# Patient Record
Sex: Female | Born: 1955
Health system: Southern US, Community
[De-identification: ages and names within clinical notes are randomized; demographics above are authoritative.]

## PROBLEM LIST (undated history)

## (undated) DIAGNOSIS — IMO0002 Reserved for concepts with insufficient information to code with codable children: Secondary | ICD-10-CM

## (undated) DIAGNOSIS — N390 Urinary tract infection, site not specified: Secondary | ICD-10-CM

## (undated) DIAGNOSIS — I059 Rheumatic mitral valve disease, unspecified: Secondary | ICD-10-CM

## (undated) DIAGNOSIS — Z86010 Personal history of colonic polyps: Principal | ICD-10-CM

## (undated) DIAGNOSIS — H269 Unspecified cataract: Secondary | ICD-10-CM

## (undated) DIAGNOSIS — M1990 Unspecified osteoarthritis, unspecified site: Secondary | ICD-10-CM

## (undated) DIAGNOSIS — E785 Hyperlipidemia, unspecified: Secondary | ICD-10-CM

## (undated) DIAGNOSIS — B2790 Infectious mononucleosis, unspecified without complication: Secondary | ICD-10-CM

## (undated) DIAGNOSIS — H409 Unspecified glaucoma: Secondary | ICD-10-CM

## (undated) DIAGNOSIS — R011 Cardiac murmur, unspecified: Secondary | ICD-10-CM

## (undated) DIAGNOSIS — M5412 Radiculopathy, cervical region: Secondary | ICD-10-CM

## (undated) HISTORY — DX: Cardiac murmur, unspecified: R01.1

## (undated) HISTORY — PX: CATARACT EXTRACTION: SUR2

## (undated) HISTORY — DX: Infectious mononucleosis, unspecified without complication: B27.90

## (undated) HISTORY — PX: COLONOSCOPY: SHX174

## (undated) HISTORY — DX: Radiculopathy, cervical region: M54.12

## (undated) HISTORY — DX: Urinary tract infection, site not specified: N39.0

## (undated) HISTORY — DX: Personal history of colonic polyps: Z86.010

## (undated) HISTORY — DX: Unspecified osteoarthritis, unspecified site: M19.90

## (undated) HISTORY — DX: Unspecified glaucoma: H40.9

## (undated) HISTORY — PX: COLPOSCOPY: SHX161

## (undated) HISTORY — DX: Reserved for concepts with insufficient information to code with codable children: IMO0002

## (undated) HISTORY — DX: Rheumatic mitral valve disease, unspecified: I05.9

## (undated) HISTORY — PX: WISDOM TOOTH EXTRACTION: SHX21

## (undated) HISTORY — DX: Hyperlipidemia, unspecified: E78.5

## (undated) HISTORY — DX: Unspecified cataract: H26.9

---

## 1998-07-02 ENCOUNTER — Encounter: Admission: RE | Admit: 1998-07-02 | Discharge: 1998-09-30 | Payer: Self-pay | Admitting: Internal Medicine

## 1998-10-10 ENCOUNTER — Other Ambulatory Visit: Admission: RE | Admit: 1998-10-10 | Discharge: 1998-10-10 | Payer: Self-pay | Admitting: Obstetrics and Gynecology

## 1999-01-22 ENCOUNTER — Encounter: Payer: Self-pay | Admitting: Obstetrics and Gynecology

## 1999-01-22 ENCOUNTER — Ambulatory Visit (HOSPITAL_COMMUNITY): Admission: RE | Admit: 1999-01-22 | Discharge: 1999-01-22 | Payer: Self-pay | Admitting: Obstetrics and Gynecology

## 1999-01-29 ENCOUNTER — Ambulatory Visit (HOSPITAL_COMMUNITY): Admission: RE | Admit: 1999-01-29 | Discharge: 1999-01-29 | Payer: Self-pay | Admitting: Obstetrics and Gynecology

## 1999-01-29 ENCOUNTER — Encounter: Payer: Self-pay | Admitting: Obstetrics and Gynecology

## 1999-02-13 ENCOUNTER — Encounter: Payer: Self-pay | Admitting: Obstetrics and Gynecology

## 1999-02-13 ENCOUNTER — Ambulatory Visit (HOSPITAL_COMMUNITY): Admission: RE | Admit: 1999-02-13 | Discharge: 1999-02-13 | Payer: Self-pay | Admitting: Obstetrics and Gynecology

## 1999-05-03 ENCOUNTER — Other Ambulatory Visit: Admission: RE | Admit: 1999-05-03 | Discharge: 1999-05-03 | Payer: Self-pay | Admitting: Obstetrics and Gynecology

## 1999-12-13 ENCOUNTER — Other Ambulatory Visit: Admission: RE | Admit: 1999-12-13 | Discharge: 1999-12-13 | Payer: Self-pay | Admitting: Obstetrics and Gynecology

## 2000-01-29 ENCOUNTER — Ambulatory Visit (HOSPITAL_COMMUNITY): Admission: RE | Admit: 2000-01-29 | Discharge: 2000-01-29 | Payer: Self-pay | Admitting: Obstetrics and Gynecology

## 2000-01-29 ENCOUNTER — Encounter: Payer: Self-pay | Admitting: Obstetrics and Gynecology

## 2000-12-18 ENCOUNTER — Other Ambulatory Visit: Admission: RE | Admit: 2000-12-18 | Discharge: 2000-12-18 | Payer: Self-pay | Admitting: Obstetrics and Gynecology

## 2000-12-21 ENCOUNTER — Encounter: Payer: Self-pay | Admitting: Obstetrics and Gynecology

## 2000-12-21 ENCOUNTER — Encounter: Admission: RE | Admit: 2000-12-21 | Discharge: 2000-12-21 | Payer: Self-pay | Admitting: Obstetrics and Gynecology

## 2001-10-06 ENCOUNTER — Other Ambulatory Visit: Admission: RE | Admit: 2001-10-06 | Discharge: 2001-10-06 | Payer: Self-pay | Admitting: Obstetrics and Gynecology

## 2001-10-12 ENCOUNTER — Encounter: Admission: RE | Admit: 2001-10-12 | Discharge: 2001-10-12 | Payer: Self-pay | Admitting: Obstetrics and Gynecology

## 2001-10-12 ENCOUNTER — Encounter: Payer: Self-pay | Admitting: Obstetrics and Gynecology

## 2002-07-14 ENCOUNTER — Other Ambulatory Visit: Admission: RE | Admit: 2002-07-14 | Discharge: 2002-07-14 | Payer: Self-pay | Admitting: Obstetrics and Gynecology

## 2002-10-25 ENCOUNTER — Encounter: Payer: Self-pay | Admitting: Obstetrics and Gynecology

## 2002-10-25 ENCOUNTER — Encounter: Admission: RE | Admit: 2002-10-25 | Discharge: 2002-10-25 | Payer: Self-pay | Admitting: Obstetrics and Gynecology

## 2003-07-17 ENCOUNTER — Other Ambulatory Visit: Admission: RE | Admit: 2003-07-17 | Discharge: 2003-07-17 | Payer: Self-pay | Admitting: Obstetrics and Gynecology

## 2003-12-11 ENCOUNTER — Encounter: Admission: RE | Admit: 2003-12-11 | Discharge: 2003-12-11 | Payer: Self-pay | Admitting: Obstetrics and Gynecology

## 2004-06-21 DIAGNOSIS — IMO0002 Reserved for concepts with insufficient information to code with codable children: Secondary | ICD-10-CM

## 2004-06-21 DIAGNOSIS — R87619 Unspecified abnormal cytological findings in specimens from cervix uteri: Secondary | ICD-10-CM

## 2004-06-21 HISTORY — DX: Reserved for concepts with insufficient information to code with codable children: IMO0002

## 2004-06-21 HISTORY — DX: Unspecified abnormal cytological findings in specimens from cervix uteri: R87.619

## 2004-06-21 HISTORY — PX: CERVICAL BIOPSY  W/ LOOP ELECTRODE EXCISION: SUR135

## 2004-07-18 ENCOUNTER — Other Ambulatory Visit: Admission: RE | Admit: 2004-07-18 | Discharge: 2004-07-18 | Payer: Self-pay | Admitting: Obstetrics and Gynecology

## 2004-07-18 ENCOUNTER — Encounter: Admission: RE | Admit: 2004-07-18 | Discharge: 2004-07-18 | Payer: Self-pay | Admitting: Obstetrics and Gynecology

## 2005-01-16 ENCOUNTER — Other Ambulatory Visit: Admission: RE | Admit: 2005-01-16 | Discharge: 2005-01-16 | Payer: Self-pay | Admitting: *Deleted

## 2005-01-21 ENCOUNTER — Encounter: Admission: RE | Admit: 2005-01-21 | Discharge: 2005-01-21 | Payer: Self-pay | Admitting: *Deleted

## 2005-02-11 ENCOUNTER — Encounter: Admission: RE | Admit: 2005-02-11 | Discharge: 2005-02-11 | Payer: Self-pay | Admitting: *Deleted

## 2005-03-13 ENCOUNTER — Ambulatory Visit: Payer: Self-pay | Admitting: Internal Medicine

## 2005-07-21 ENCOUNTER — Other Ambulatory Visit: Admission: RE | Admit: 2005-07-21 | Discharge: 2005-07-21 | Payer: Self-pay | Admitting: *Deleted

## 2005-09-29 ENCOUNTER — Ambulatory Visit: Payer: Self-pay | Admitting: Internal Medicine

## 2005-10-06 ENCOUNTER — Ambulatory Visit: Payer: Self-pay | Admitting: Internal Medicine

## 2005-10-07 ENCOUNTER — Ambulatory Visit: Payer: Self-pay | Admitting: Internal Medicine

## 2005-11-12 ENCOUNTER — Ambulatory Visit (HOSPITAL_COMMUNITY): Admission: RE | Admit: 2005-11-12 | Discharge: 2005-11-12 | Payer: Self-pay | Admitting: Gastroenterology

## 2005-11-12 ENCOUNTER — Encounter (INDEPENDENT_AMBULATORY_CARE_PROVIDER_SITE_OTHER): Payer: Self-pay | Admitting: Specialist

## 2006-01-16 ENCOUNTER — Other Ambulatory Visit: Admission: RE | Admit: 2006-01-16 | Discharge: 2006-01-16 | Payer: Self-pay | Admitting: Obstetrics and Gynecology

## 2006-02-04 ENCOUNTER — Encounter: Admission: RE | Admit: 2006-02-04 | Discharge: 2006-02-04 | Payer: Self-pay | Admitting: Obstetrics and Gynecology

## 2006-08-13 ENCOUNTER — Other Ambulatory Visit: Admission: RE | Admit: 2006-08-13 | Discharge: 2006-08-13 | Payer: Self-pay | Admitting: Obstetrics & Gynecology

## 2006-10-26 ENCOUNTER — Ambulatory Visit: Payer: Self-pay | Admitting: Internal Medicine

## 2006-10-26 LAB — CONVERTED CEMR LAB
Albumin: 4 g/dL (ref 3.5–5.2)
BUN: 12 mg/dL (ref 6–23)
Basophils Absolute: 0.1 10*3/uL (ref 0.0–0.1)
CO2: 29 meq/L (ref 19–32)
Calcium: 9.2 mg/dL (ref 8.4–10.5)
Chol/HDL Ratio, serum: 4.3
Creatinine, Ser: 0.9 mg/dL (ref 0.4–1.2)
Glucose, Bld: 95 mg/dL (ref 70–99)
Hemoglobin: 13.5 g/dL (ref 12.0–15.0)
Ketones, ur: NEGATIVE mg/dL
Lymphocytes Relative: 28.6 % (ref 12.0–46.0)
MCHC: 33.5 g/dL (ref 30.0–36.0)
Monocytes Absolute: 0.4 10*3/uL (ref 0.2–0.7)
Monocytes Relative: 7.3 % (ref 3.0–11.0)
Mucus, UA: NEGATIVE
Neutro Abs: 3 10*3/uL (ref 1.4–7.7)
Neutrophils Relative %: 60.5 % (ref 43.0–77.0)
Platelets: 292 10*3/uL (ref 150–400)
TSH: 0.78 microintl units/mL (ref 0.35–5.50)
Triglyceride fasting, serum: 70 mg/dL (ref 0–149)
Urine Glucose: NEGATIVE mg/dL
Urobilinogen, UA: 0.2 (ref 0.0–1.0)
VLDL: 14 mg/dL (ref 0–40)
pH: 5.5 (ref 5.0–8.0)

## 2006-11-02 ENCOUNTER — Ambulatory Visit: Payer: Self-pay | Admitting: Internal Medicine

## 2007-02-09 ENCOUNTER — Encounter: Admission: RE | Admit: 2007-02-09 | Discharge: 2007-02-09 | Payer: Self-pay | Admitting: Internal Medicine

## 2007-03-15 ENCOUNTER — Ambulatory Visit: Payer: Self-pay | Admitting: Internal Medicine

## 2007-03-15 LAB — CONVERTED CEMR LAB
Albumin: 3.9 g/dL (ref 3.5–5.2)
Alkaline Phosphatase: 17 units/L — ABNORMAL LOW (ref 39–117)
Cholesterol: 186 mg/dL (ref 0–200)
LDL Cholesterol: 117 mg/dL — ABNORMAL HIGH (ref 0–99)
Total CHOL/HDL Ratio: 3.3
Total Protein: 7.1 g/dL (ref 6.0–8.3)
VLDL: 13 mg/dL (ref 0–40)

## 2007-08-05 DIAGNOSIS — E785 Hyperlipidemia, unspecified: Secondary | ICD-10-CM | POA: Insufficient documentation

## 2007-08-05 DIAGNOSIS — I059 Rheumatic mitral valve disease, unspecified: Secondary | ICD-10-CM

## 2007-08-05 HISTORY — DX: Rheumatic mitral valve disease, unspecified: I05.9

## 2007-08-16 ENCOUNTER — Other Ambulatory Visit: Admission: RE | Admit: 2007-08-16 | Discharge: 2007-08-16 | Payer: Self-pay | Admitting: Obstetrics and Gynecology

## 2007-09-28 ENCOUNTER — Telehealth (INDEPENDENT_AMBULATORY_CARE_PROVIDER_SITE_OTHER): Payer: Self-pay | Admitting: *Deleted

## 2007-11-29 ENCOUNTER — Encounter: Payer: Self-pay | Admitting: Internal Medicine

## 2007-11-29 ENCOUNTER — Ambulatory Visit: Payer: Self-pay | Admitting: Neurosurgery

## 2007-11-29 LAB — CONVERTED CEMR LAB
Albumin: 3.5 g/dL (ref 3.5–5.2)
Alkaline Phosphatase: 24 units/L — ABNORMAL LOW (ref 39–117)
BUN: 8 mg/dL (ref 6–23)
Basophils Relative: 1.9 % — ABNORMAL HIGH (ref 0.0–1.0)
Crystals: NEGATIVE
Eosinophils Absolute: 0.2 10*3/uL (ref 0.0–0.6)
GFR calc Af Amer: 97 mL/min
HDL: 51.7 mg/dL (ref >39.0)
Ketones, ur: NEGATIVE mg/dL
Leukocytes, UA: NEGATIVE
Lymphocytes Relative: 22.5 % (ref 12.0–46.0)
Monocytes Relative: 6.2 % (ref 3.0–11.0)
Neutro Abs: 5.4 10*3/uL (ref 1.4–7.7)
Platelets: 256 10*3/uL (ref 150–400)
Potassium: 4.1 meq/L (ref 3.5–5.1)
Specific Gravity, Urine: 1.025 (ref 1.000–1.03)
Total CHOL/HDL Ratio: 3.6
Total Protein: 6.5 g/dL (ref 6.0–8.3)
Triglycerides: 79 mg/dL (ref 0–149)
Urine Glucose: NEGATIVE mg/dL
Urobilinogen, UA: 0.2 (ref 0.0–1.0)
VLDL: 16 mg/dL (ref 0–40)
Vit D, 1,25-Dihydroxy: 38 (ref 30–89)
pH: 5.5 (ref 5.0–8.0)

## 2007-11-30 ENCOUNTER — Ambulatory Visit: Payer: Self-pay | Admitting: Internal Medicine

## 2007-11-30 ENCOUNTER — Encounter: Payer: Self-pay | Admitting: Internal Medicine

## 2007-12-01 ENCOUNTER — Telehealth: Payer: Self-pay | Admitting: Internal Medicine

## 2007-12-06 ENCOUNTER — Ambulatory Visit: Payer: Self-pay | Admitting: Internal Medicine

## 2007-12-06 DIAGNOSIS — N63 Unspecified lump in unspecified breast: Secondary | ICD-10-CM | POA: Insufficient documentation

## 2007-12-06 DIAGNOSIS — N390 Urinary tract infection, site not specified: Secondary | ICD-10-CM

## 2007-12-06 HISTORY — DX: Urinary tract infection, site not specified: N39.0

## 2007-12-07 ENCOUNTER — Telehealth: Payer: Self-pay | Admitting: Internal Medicine

## 2007-12-07 ENCOUNTER — Encounter: Payer: Self-pay | Admitting: Internal Medicine

## 2007-12-08 ENCOUNTER — Encounter: Admission: RE | Admit: 2007-12-08 | Discharge: 2007-12-08 | Payer: Self-pay | Admitting: Internal Medicine

## 2008-01-03 ENCOUNTER — Telehealth: Payer: Self-pay | Admitting: *Deleted

## 2008-02-22 ENCOUNTER — Telehealth (INDEPENDENT_AMBULATORY_CARE_PROVIDER_SITE_OTHER): Payer: Self-pay | Admitting: *Deleted

## 2008-03-09 ENCOUNTER — Encounter: Admission: RE | Admit: 2008-03-09 | Discharge: 2008-03-09 | Payer: Self-pay | Admitting: Internal Medicine

## 2008-08-27 ENCOUNTER — Inpatient Hospital Stay (HOSPITAL_COMMUNITY): Admission: AD | Admit: 2008-08-27 | Discharge: 2008-08-27 | Payer: Self-pay | Admitting: Gynecology

## 2008-10-09 ENCOUNTER — Other Ambulatory Visit: Admission: RE | Admit: 2008-10-09 | Discharge: 2008-10-09 | Payer: Self-pay | Admitting: Obstetrics and Gynecology

## 2008-11-29 ENCOUNTER — Ambulatory Visit: Payer: Self-pay | Admitting: Internal Medicine

## 2008-11-29 LAB — CONVERTED CEMR LAB
Albumin: 3.8 g/dL (ref 3.5–5.2)
BUN: 12 mg/dL (ref 6–23)
Basophils Relative: 0.7 % (ref 0.0–3.0)
Calcium: 8.8 mg/dL (ref 8.4–10.5)
Cholesterol: 181 mg/dL (ref 0–200)
Creatinine, Ser: 0.7 mg/dL (ref 0.4–1.2)
Eosinophils Absolute: 0.2 10*3/uL (ref 0.0–0.7)
Eosinophils Relative: 2.2 % (ref 0.0–5.0)
GFR calc Af Amer: 113 mL/min
GFR calc non Af Amer: 93 mL/min
HCT: 38.7 % (ref 36.0–46.0)
HDL: 45.2 mg/dL (ref >39.0)
Hemoglobin, Urine: NEGATIVE
Hemoglobin: 13.1 g/dL (ref 12.0–15.0)
MCV: 93.4 fL (ref 78.0–100.0)
Monocytes Absolute: 0.6 10*3/uL (ref 0.1–1.0)
Neutro Abs: 4.6 10*3/uL (ref 1.4–7.7)
Platelets: 240 10*3/uL (ref 150–400)
Potassium: 4.1 meq/L (ref 3.5–5.1)
TSH: 1.24 microintl units/mL (ref 0.35–5.50)
Total Protein, Urine: NEGATIVE mg/dL
Total Protein: 6.6 g/dL (ref 6.0–8.3)
Urine Glucose: NEGATIVE mg/dL
VLDL: 18 mg/dL (ref 0–40)
WBC: 7.3 10*3/uL (ref 4.5–10.5)

## 2008-12-06 ENCOUNTER — Ambulatory Visit: Payer: Self-pay | Admitting: Internal Medicine

## 2008-12-07 ENCOUNTER — Telehealth: Payer: Self-pay | Admitting: Internal Medicine

## 2008-12-07 ENCOUNTER — Encounter: Payer: Self-pay | Admitting: Internal Medicine

## 2009-03-13 ENCOUNTER — Encounter: Admission: RE | Admit: 2009-03-13 | Discharge: 2009-03-13 | Payer: Self-pay | Admitting: Internal Medicine

## 2009-10-22 ENCOUNTER — Telehealth: Payer: Self-pay | Admitting: Internal Medicine

## 2010-01-17 ENCOUNTER — Ambulatory Visit: Payer: Self-pay | Admitting: Internal Medicine

## 2010-01-17 ENCOUNTER — Encounter: Payer: Self-pay | Admitting: Internal Medicine

## 2010-01-29 ENCOUNTER — Ambulatory Visit: Payer: Self-pay | Admitting: Internal Medicine

## 2010-01-29 LAB — CONVERTED CEMR LAB
Albumin: 4.2 g/dL (ref 3.5–5.2)
Alkaline Phosphatase: 22 units/L — ABNORMAL LOW (ref 39–117)
Basophils Absolute: 0 10*3/uL (ref 0.0–0.1)
Basophils Relative: 0 % (ref 0.0–3.0)
Calcium: 9.3 mg/dL (ref 8.4–10.5)
Cholesterol: 204 mg/dL — ABNORMAL HIGH (ref 0–200)
Direct LDL: 134.3 mg/dL
Eosinophils Absolute: 0.1 10*3/uL (ref 0.0–0.7)
GFR calc non Af Amer: 79.63 mL/min (ref >60)
HDL: 55.3 mg/dL (ref >39.00)
Hemoglobin, Urine: NEGATIVE
Leukocytes, UA: NEGATIVE
Lymphocytes Relative: 28.7 % (ref 12.0–46.0)
MCHC: 34 g/dL (ref 30.0–36.0)
MCV: 93.1 fL (ref 78.0–100.0)
Monocytes Absolute: 0.4 10*3/uL (ref 0.1–1.0)
Neutro Abs: 3.7 10*3/uL (ref 1.4–7.7)
Neutrophils Relative %: 61.9 % (ref 43.0–77.0)
Nitrite: NEGATIVE
Potassium: 4.5 meq/L (ref 3.5–5.1)
RDW: 12.5 % (ref 11.5–14.6)
Sodium: 144 meq/L (ref 135–145)
Specific Gravity, Urine: 1.025 (ref 1.000–1.030)
Total Protein: 7.1 g/dL (ref 6.0–8.3)
Urine Glucose: NEGATIVE mg/dL
Urobilinogen, UA: 0.2 (ref 0.0–1.0)
VLDL: 18.4 mg/dL (ref 0.0–40.0)

## 2010-02-01 LAB — CONVERTED CEMR LAB: Vit D, 25-Hydroxy: 52 ng/mL (ref 30–89)

## 2010-02-05 ENCOUNTER — Ambulatory Visit: Payer: Self-pay | Admitting: Internal Medicine

## 2010-04-30 ENCOUNTER — Encounter: Admission: RE | Admit: 2010-04-30 | Discharge: 2010-04-30 | Payer: Self-pay | Admitting: Internal Medicine

## 2010-08-06 ENCOUNTER — Telehealth: Payer: Self-pay | Admitting: Internal Medicine

## 2010-08-08 ENCOUNTER — Ambulatory Visit: Payer: Self-pay | Admitting: Internal Medicine

## 2010-08-08 LAB — CONVERTED CEMR LAB
Direct LDL: 133.8 mg/dL
Total CHOL/HDL Ratio: 4

## 2010-09-12 ENCOUNTER — Encounter
Admission: RE | Admit: 2010-09-12 | Discharge: 2010-12-10 | Payer: Self-pay | Source: Home / Self Care | Attending: Internal Medicine | Admitting: Internal Medicine

## 2010-09-24 ENCOUNTER — Ambulatory Visit: Payer: Self-pay | Admitting: Family Medicine

## 2010-09-24 DIAGNOSIS — R109 Unspecified abdominal pain: Secondary | ICD-10-CM | POA: Insufficient documentation

## 2010-09-24 DIAGNOSIS — R350 Frequency of micturition: Secondary | ICD-10-CM | POA: Insufficient documentation

## 2010-09-25 ENCOUNTER — Encounter: Payer: Self-pay | Admitting: Internal Medicine

## 2010-09-25 LAB — CONVERTED CEMR LAB
ALT: 13 units/L (ref 0–35)
Albumin: 4.3 g/dL (ref 3.5–5.2)
Basophils Relative: 1 % (ref 0.0–3.0)
Bilirubin, Direct: 0.1 mg/dL (ref 0.0–0.3)
CO2: 30 meq/L (ref 19–32)
Chloride: 103 meq/L (ref 96–112)
Creatinine, Ser: 0.8 mg/dL (ref 0.4–1.2)
Eosinophils Relative: 1.1 % (ref 0.0–5.0)
HCT: 38.6 % (ref 36.0–46.0)
Hemoglobin: 13.3 g/dL (ref 12.0–15.0)
MCHC: 34.5 g/dL (ref 30.0–36.0)
MCV: 92.3 fL (ref 78.0–100.0)
Monocytes Absolute: 0.4 10*3/uL (ref 0.1–1.0)
Neutro Abs: 4.6 10*3/uL (ref 1.4–7.7)
Neutrophils Relative %: 62.5 % (ref 43.0–77.0)
Potassium: 3.8 meq/L (ref 3.5–5.1)
RBC: 4.18 M/uL (ref 3.87–5.11)
Sed Rate: 10 mm/hr (ref 0–22)
Sodium: 141 meq/L (ref 135–145)
Total Protein: 7.3 g/dL (ref 6.0–8.3)
WBC: 7.4 10*3/uL (ref 4.5–10.5)

## 2010-10-22 LAB — HM PAP SMEAR

## 2010-10-25 ENCOUNTER — Telehealth: Payer: Self-pay | Admitting: Internal Medicine

## 2010-10-28 ENCOUNTER — Ambulatory Visit: Payer: Self-pay | Admitting: Internal Medicine

## 2010-10-31 ENCOUNTER — Telehealth: Payer: Self-pay | Admitting: Internal Medicine

## 2010-11-19 ENCOUNTER — Telehealth: Payer: Self-pay | Admitting: Internal Medicine

## 2010-12-18 ENCOUNTER — Telehealth (INDEPENDENT_AMBULATORY_CARE_PROVIDER_SITE_OTHER): Payer: Self-pay | Admitting: *Deleted

## 2011-01-19 LAB — CONVERTED CEMR LAB: Pap Smear: NORMAL

## 2011-01-21 NOTE — Assessment & Plan Note (Signed)
Summary: FEVER, CHILLS // RS   Vital Signs:  Patient profile:   55 year old female Height:      61.5 inches (156.21 cm) Weight:      115 pounds (52.27 kg) O2 Sat:      98 % on Room air Temp:     98.7 degrees F (37.06 degrees C) oral Pulse rate:   73 / minute BP sitting:   138 / 90  (left arm) Cuff size:   regular  Vitals Entered By: Josph Macho RMA (September 24, 2010 3:24 PM)  O2 Flow:  Room air CC: Fells fevered, Chills, tired, headache, nausea X5 days/ CF Is Patient Diabetic? No   History of Present Illness: Patient in today for evaluation of multiple vague complaints. she begins by discussing the fact that she's been struggling with bilateral carpal tunnel syndrome off and on for quite some time in the past 6 weeks the symptoms have worsened. She is presently in physical therapy and unable to do her typical work. She is using wrist splints has been started on multiple NSAIDs including diclofenac and Etodolac. She has stopped both of these due to some onset of nausea but the nausea has not resolved. She reports her bowels do move daily formed nonbloody and not black. The nausea has persisted but there's been no vomiting. She is preparing for a trip to [place] and has been taking typhoid tablets over the last week, then last week took her flu shot midweek and has been feeling a little worse each day until then. She notes some low grade malaise/f/c and a dull HA. Denies any nasal congestion/cough/CP/palp/SOB/ear pain/sore throat. Does note some slight scratchy throat and some mild am urinary frequency. No dysuria/urgency or hematuria  Current Medications (verified): 1)  Acyclovir 800 Mg Tabs (Acyclovir) .... As Needed 2)  Multivitamins   Tabs (Multiple Vitamin) .... Take 1 By Mouth Qd 3)  Calcium-D 600-200 Mg-Unit  Tabs (Calcium Carbonate-Vitamin D) .... Take 2 By Mouth Qd 4)  Vitamin D 1000 Unit  Caps (Cholecalciferol) .... One By Mouth Daily 5)  Krill Oil 1000 Mg Caps (Krill Oil) ....  Two By Mouth Two Times A Day 6)  Glucosamine 500 Mg Caps (Glucosamine Sulfate)  Allergies (verified): No Known Drug Allergies  Past History:  Past medical history reviewed for relevance to current acute and chronic problems. Social history (including risk factors) reviewed for relevance to current acute and chronic problems.  Past Medical History: Reviewed history from 08/05/2007 and no changes required. Hyperlipidemia  Social History: Reviewed history and no changes required.  Review of Systems      See HPI  Physical Exam  General:  Well-developed,well-nourished,in no acute distress; alert,appropriate and cooperative throughout examination Head:  Normocephalic and atraumatic without obvious abnormalities. Nose:  External nasal examination shows no deformity or inflammation. Nasal mucosa are pink and moist without lesions or exudates. Mouth:  Oral mucosa and oropharynx without lesions or exudates.  Teeth in good repair. Neck:  No deformities, masses, or tenderness noted. Lungs:  Normal respiratory effort, chest expands symmetrically. Lungs are clear to auscultation, no crackles or wheezes. Heart:  Normal rate and regular rhythm. S1 and S2 normal without gallop, murmur, click, rub or other extra sounds. Abdomen:  Bowel sounds positive,abdomen soft and without masses, organomegaly or hernias noted. Slightly tender with palpation b/l LQ Msk:  No deformity or scoliosis noted of thoracic or lumbar spine.   Extremities:  No clubbing, cyanosis, edema, or deformity noted with normal  full range of motion of all joints.   Psych:  Cognition and judgment appear intact. Alert and cooperative with normal attention span and concentration. No apparent delusions, illusions, hallucinations   Impression & Recommendations:  Problem # 1:  ABDOMINAL PAIN, UNSPECIFIED SITE (ICD-789.00)  Push clear fluids, avoid fatty and spicy foods. Check a CBC, lft, renal  and ESR   Problem # 2:  URI  (ICD-465.9)  Orders: TLB-BMP (Basic Metabolic Panel-BMET) (80048-METABOL) TLB-CBC Platelet - w/Differential (85025-CBCD) TLB-Hepatic/Liver Function Pnl (80076-HEPATIC) TLB-Sedimentation Rate (ESR) (85652-ESR) Specimen Handling (09323) Venipuncture (55732) Low grade symptoms but heading to [place] at the end of the week may take a course of Azithromycin with her and take a probiotic and Mucinex as well  Problem # 3:  NAUSEA (ICD-787.02)  Orders: TLB-TSH (Thyroid Stimulating Hormone) (84443-TSH) Specimen Handling (20254) Venipuncture (27062) Given Ranitidine to take daily x 7 days and then as needed after that  Complete Medication List: 1)  Acyclovir 800 Mg Tabs (Acyclovir) .... As needed 2)  Multivitamins Tabs (Multiple vitamin) .... Take 1 by mouth qd 3)  Calcium-d 600-200 Mg-unit Tabs (Calcium carbonate-vitamin d) .... Take 2 by mouth qd 4)  Vitamin D 1000 Unit Caps (Cholecalciferol) .... One by mouth daily 5)  Krill Oil 1000 Mg Caps (Krill oil) .... Two by mouth two times a day 6)  Glucosamine 500 Mg Caps (Glucosamine sulfate) 7)  Azithromycin 250 Mg Tabs (Azithromycin) .... 2 tabs by mouth once and then 1 tab by mouth once daily x 4 days 8)  Ranitidine Hcl 150 Mg Caps (Ranitidine hcl) .Marland Kitchen.. 1 tab by mouth once daily x 7 days and then as needed daily for reflux and nausea  Other Orders: UA Dipstick w/o Micro (automated)  (81003)  Patient Instructions: 1)  Please schedule a follow-up appointment as needed .  2)  For nausea try ginger or benadryl and maintain a bland diet. 3)  If you start the Azithromycin take the whole course and take Mucinex two times a day x 10 days 4)  P/U a Kimber of Align probiotic caps and take 1 daily for the next month to replenish the good bacteria in the GI tract Prescriptions: RANITIDINE HCL 150 MG CAPS (RANITIDINE HCL) 1 tab by mouth once daily x 7 days and then as needed daily for reflux and nausea  #30 x 2   Entered and Authorized by:   Danise Edge  MD   Signed by:   Danise Edge MD on 09/24/2010   Method used:   Electronically to        [facility]* (retail)       [address]       [address] Shipping/mailing       [place], [place]  [address]       Ph: [phone]       Fax: [phone]   RxID:   [id number] AZITHROMYCIN 250 MG TABS (AZITHROMYCIN) 2 tabs by mouth once and then 1 tab by mouth once daily x 4 days  #6 x 0   Entered and Authorized by:   Danise Edge MD   Signed by:   Danise Edge MD on 09/24/2010   Method used:   Electronically to        [facility]* (retail)       [address]       [address] Shipping/mailing       [place], [place]  [address]  Ph: [phone]       Fax: [phone]   RxID:   [id number]   Appended Document: FEVER, CHILLS // RS  Laboratory Results   Urine Tests    Routine Urinalysis   Color: yellow Appearance: Clear Glucose: negative   (Normal Range: Negative) Bilirubin: 1+   (Normal Range: Negative) Ketone: negative   (Normal Range: Negative) Spec. Gravity: 1.015   (Normal Range: 1.003-1.035) Blood: negative   (Normal Range: Negative) pH: 5.0   (Normal Range: 5.0-8.0) Protein: negative   (Normal Range: Negative) Urobilinogen: 0.2   (Normal Range: 0-1) Nitrite: negative   (Normal Range: Negative) Leukocyte Esterace: negative   (Normal Range: Negative)    Comments: Rita Ohara  September 24, 2010 4:48 PM

## 2011-01-21 NOTE — Progress Notes (Signed)
Summary: knee pain again & other ?  Phone Note Call from Patient Call back at [facility] Phone [phone]   Summary of Call: Knee bothering me again, since off glucosamine.  Should I resume it?  Was to take Krill oil & stop glucosamine because numbers up since Jan.  Should I have another lipid panel?  Initial call taken by: Rudy Jew, RN,  August 06, 2010 8:53 AM  Follow-up for Phone Call        may resume and order a lipid panel prior 272.0 Follow-up by: Stacie Glaze MD,  August 06, 2010 9:14 AM  Additional Follow-up for Phone Call Additional follow up Details #1::        Phone Call Completed Additional Follow-up by: Rudy Jew, RN,  August 06, 2010 9:23 AM    New/Updated Medications: GLUCOSAMINE 500 MG CAPS (GLUCOSAMINE SULFATE)

## 2011-01-21 NOTE — Assessment & Plan Note (Signed)
Summary: CPX / RS/PT RESCD//CCM   Vital Signs:  Patient profile:   55 year old female Height:      61.5 inches Weight:      118 pounds BMI:     22.01 Temp:     98.2 degrees F oral Pulse rate:   60 / minute Pulse rhythm:   regular Resp:     12 per minute BP sitting:   128 / 80  (left arm)  Vitals Entered By: Willy Eddy, LPN (February 05, 2010 3:14 PM) CC: cpx   CC:  cpx.  History of Present Illness: The pt was asked about all immunizations, health maint. services that are appropriate to their age and was given guidance on diet exercize  and weight management   Preventive Screening-Counseling & Management  Alcohol-Tobacco     Smoking Status: never  Problems Prior to Update: 1)  Family History of Stroke  (ICD-V17.1) 2)  Physical Examination  (ICD-V70.0) 3)  Breast Mass, Right  (ICD-611.72) 4)  Urinary Tract Infection, Recurrent  (ICD-599.0) 5)  Routine General Medical Exam@health  Care Facl  (ICD-V70.0) 6)  Family History Diabetes 1st Degree Relative  (ICD-V18.0) 7)  Mitral Valve Prolapse  (ICD-424.0) 8)  Hyperlipidemia  (ICD-272.4)  Current Problems (verified): 1)  Family History of Stroke  (ICD-V17.1) 2)  Physical Examination  (ICD-V70.0) 3)  Breast Mass, Right  (ICD-611.72) 4)  Urinary Tract Infection, Recurrent  (ICD-599.0) 5)  Routine General Medical Exam@health  Care Facl  (ICD-V70.0) 6)  Family History Diabetes 1st Degree Relative  (ICD-V18.0) 7)  Mitral Valve Prolapse  (ICD-424.0) 8)  Hyperlipidemia  (ICD-272.4)  Medications Prior to Update: 1)  Acyclovir 800 Mg Tabs (Acyclovir) .... As Needed 2)  Multivitamins   Tabs (Multiple Vitamin) .... Take 1 By Mouth Qd 3)  Calcium-D 600-200 Mg-Unit  Tabs (Calcium Carbonate-Vitamin D) .... Take 2 By Mouth Qd 4)  Glucosamine Sulfate 1000 Mg  Tabs (Glucosamine Sulfate) .... Take 1 By Mouth Qd 5)  Vitamin D 1000 Unit  Caps (Cholecalciferol) .... One By Mouth Daily 6)  Fish Oil 1000 Mg Caps (Omega-3 Fatty Acids)  .Marland Kitchen.. 1 Once Daily  Current Medications (verified): 1)  Acyclovir 800 Mg Tabs (Acyclovir) .... As Needed 2)  Multivitamins   Tabs (Multiple Vitamin) .... Take 1 By Mouth Qd 3)  Calcium-D 600-200 Mg-Unit  Tabs (Calcium Carbonate-Vitamin D) .... Take 2 By Mouth Qd 4)  Vitamin D 1000 Unit  Caps (Cholecalciferol) .... One By Mouth Daily 5)  Krill Oil 1000 Mg Caps (Krill Oil) .... Two By Mouth Two Times A Day  Allergies (verified): No Known Drug Allergies  Past History:  Family History: Last updated: 08/05/2007 Family History Diabetes 1st degree relative Fam hx CHF  Risk Factors: Smoking Status: never (02/05/2010)  Past medical, surgical, family and social histories (including risk factors) reviewed for relevance to current acute and chronic problems.  Past Medical History: Reviewed history from 08/05/2007 and no changes required. Hyperlipidemia  Family History: Reviewed history from 08/05/2007 and no changes required. Family History Diabetes 1st degree relative Fam hx CHF  Social History: Reviewed history and no changes required. Smoking Status:  never  Review of Systems  The patient denies anorexia, fever, weight loss, weight gain, vision loss, decreased hearing, hoarseness, chest pain, syncope, dyspnea on exertion, peripheral edema, prolonged cough, headaches, hemoptysis, abdominal pain, melena, hematochezia, severe indigestion/heartburn, hematuria, incontinence, genital sores, muscle weakness, suspicious skin lesions, transient blindness, difficulty walking, depression, unusual weight change, abnormal bleeding, enlarged lymph nodes, angioedema,  and breast masses.    Physical Exam  General:  Well-developed,well-nourished,in no acute distress; alert,appropriate and cooperative throughout examination Head:  Normocephalic and atraumatic without obvious abnormalities. No apparent alopecia or balding. Eyes:  pupils equal and pupils round.   Ears:  R ear normal and no external  deformities.   Nose:  no nasal discharge.   Mouth:  good dentition and pharynx pink and moist.   Neck:  No deformities, masses, or tenderness noted. Breasts:  No mass, nodules, thickening, tenderness, bulging, retraction, inflamation, nipple discharge or skin changes noted.   Lungs:  no wheezes.   Heart:  normal rate, regular rhythm, and no murmur.   Abdomen:  Bowel sounds positive,abdomen soft and non-tender without masses, organomegaly or hernias noted. Msk:  normal ROM and no joint tenderness.   Pulses:  R and L carotid,radial,femoral,dorsalis pedis and posterior tibial pulses are full and equal bilaterally Extremities:  No clubbing, cyanosis, edema, or deformity noted with normal full range of motion of all joints.   Neurologic:  alert & oriented X3 and cranial nerves II-XII intact.   Skin:  Intact without suspicious lesions or rashes Cervical Nodes:  No lymphadenopathy noted Axillary Nodes:  No palpable lymphadenopathy Psych:  Cognition and judgment appear intact. Alert and cooperative with normal attention span and concentration. No apparent delusions, illusions, hallucinations   Impression & Recommendations:  Problem # 1:  HYPERLIPIDEMIA (ICD-272.4)  Labs Reviewed: SGOT: 19 (01/29/2010)   SGPT: 18 (01/29/2010)   HDL:55.30 (01/29/2010), 45.2 (11/29/2008)  LDL:118 (11/29/2008), 120 (11/29/2007)  Chol:204 (01/29/2010), 181 (11/29/2008)  Trig:92.0 (01/29/2010), 89 (11/29/2008) kril oil 1000 mg two by mouth two times a day  Problem # 2:  PHYSICAL EXAMINATION (ICD-V70.0) The pt was asked about all immunizations, health maint. services that are appropriate to their age and was given guidance on diet exercize  and weight management  Mammogram: normal (11/20/2009) Pap smear: normal (11/20/2009) Colonoscopy: normal (09/02/2006) Bone Density: normal (01/17/2010) Td Booster: Historical (11/21/2006)   Flu Vax: Historical (10/26/2009)   Chol: 204 (01/29/2010)   HDL: 55.30 (01/29/2010)    LDL: 118 (11/29/2008)   TG: 92.0 (01/29/2010) TSH: 1.00 (01/29/2010)   Next mammogram due:: 11/2010 (02/05/2010) Next Bone Density due:: 01/2012 (02/05/2010)  Discussed using sunscreen, use of alcohol, drug use, self breast exam, routine dental care, routine eye care, schedule for GYN exam, routine physical exam, seat belts, multiple vitamins, osteoporosis prevention, adequate calcium intake in diet, recommendations for immunizations, mammograms and Pap smears.  Discussed exercise and checking cholesterol.  Discussed gun safety, safe sex, and contraception.  Complete Medication List: 1)  Acyclovir 800 Mg Tabs (Acyclovir) .... As needed 2)  Multivitamins Tabs (Multiple vitamin) .... Take 1 by mouth qd 3)  Calcium-d 600-200 Mg-unit Tabs (Calcium carbonate-vitamin d) .... Take 2 by mouth qd 4)  Vitamin D 1000 Unit Caps (Cholecalciferol) .... One by mouth daily 5)  Krill Oil 1000 Mg Caps (Krill oil) .... Two by mouth two times a day  Patient Instructions: 1)  stop the glucosamine and take the kril oil 1000 mg  two by mouth two times a day 2)  ' 3)  Please schedule a follow-up appointment in 1 year. Prescriptions: ACYCLOVIR 800 MG TABS (ACYCLOVIR) as needed  #28 x 11   Entered and Authorized by:   Stacie Glaze MD   Signed by:   Stacie Glaze MD on 02/05/2010   Method used:   Electronically to        [facility]* (  retail)       [address]       [address] Shipping/mailing       [place], [place]  [address]       Ph: [phone]       Fax: [phone]   RxID:   [id number]    Immunization History:  Influenza Immunization History:    Influenza:  historical (10/26/2009)    Preventive Care Screening  Bone Density:    Date:  01/17/2010    Next Due:  01/2012    Results:  normal std dev  Mammogram:    Date:  11/20/2009    Next Due:  11/2010    Results:  normal   Pap Smear:    Date:  11/20/2009    Next Due:  11/2010    Results:  normal

## 2011-01-21 NOTE — Progress Notes (Signed)
  Phone Note Call from Patient   Caller: Patient Call For: Stacie Glaze MD Summary of Call: Alk Phos:  27 Can she take ASA instead of Ultram? Tylenol?? Initial call taken by: Lynann Beaver CMA AAMA,  October 31, 2010 4:35 PM  Follow-up for Phone Call        per dr Lovell Sheehan- may take asa Follow-up by: Willy Eddy, LPN,  November 01, 2010 10:56 AM  Additional Follow-up for Phone Call Additional follow up Details #1::        Pt is concerned about her Alk Phos:  27 Additional Follow-up by: Lynann Beaver CMA AAMA,  November 01, 2010 11:08 AM    Additional Follow-up for Phone Call Additional follow up Details #2::    that is good-that mean there is no activity going in her liver.  Pt. notified. Lynann Beaver CMA AAMA  November 01, 2010 11:25 AM  Follow-up by: Willy Eddy, LPN,  November 01, 2010 11:13 AM

## 2011-01-21 NOTE — Assessment & Plan Note (Signed)
Summary: bp running  high/bmw   Vital Signs:  Patient profile:   55 year old female Height:      61.5 inches Weight:      114 pounds BMI:     21.27 Temp:     98.2 degrees F oral Pulse rate:   72 / minute Resp:     14 per minute BP sitting:   140 / 82  (left arm)  Vitals Entered By: Willy Eddy, LPN (October 28, 2010 3:20 PM) CC: c/o elevated bp- but she realized she has been taking etodolac, diclofenac and aleve, Lipid Management Is Patient Diabetic? No   Primary Care Provider:  Stacie Glaze MD  CC:  c/o elevated bp- but she realized she has been taking etodolac, diclofenac and aleve, and Lipid Management.  History of Present Illness: the  pt has been on multiple NSAIDs for carple tunnel.Marland Kitchen as been on etodolac and diclofinac change to aleve 200 two times a day has noted head aches and stress had been on OTC cold meds with psuedofed and noted blood pressure elevated the bmet was stable  Lipid Management History:      Negative NCEP/ATP III risk factors include female age less than 37 years old and non-tobacco-user status.     Preventive Screening-Counseling & Management  Alcohol-Tobacco     Smoking Status: never     Tobacco Counseling: not indicated; no tobacco use  Problems Prior to Update: 1)  Carpal Tunnel Syndrome, Bilateral  (ICD-354.0) 2)  Oth Diuretics Caus Advrs Effect Therapeutic Use  (ICD-E944.4) 3)  Elevated Bp Reading Without Dx Hypertension  (ICD-796.2) 4)  Frequency, Urinary  (ICD-788.41) 5)  Abdominal Pain, Unspecified Site  (ICD-789.00) 6)  Uri  (ICD-465.9) 7)  Nausea  (ICD-787.02) 8)  Family History of Stroke  (ICD-V17.1) 9)  Physical Examination  (ICD-V70.0) 10)  Breast Mass, Right  (ICD-611.72) 11)  Urinary Tract Infection, Recurrent  (ICD-599.0) 12)  Routine General Medical Exam@health  Care Facl  (ICD-V70.0) 13)  Family History Diabetes 1st Degree Relative  (ICD-V18.0) 14)  Mitral Valve Prolapse  (ICD-424.0) 15)  Hyperlipidemia   (ICD-272.4)  Current Problems (verified): 1)  Frequency, Urinary  (ICD-788.41) 2)  Abdominal Pain, Unspecified Site  (ICD-789.00) 3)  Uri  (ICD-465.9) 4)  Nausea  (ICD-787.02) 5)  Family History of Stroke  (ICD-V17.1) 6)  Physical Examination  (ICD-V70.0) 7)  Breast Mass, Right  (ICD-611.72) 8)  Urinary Tract Infection, Recurrent  (ICD-599.0) 9)  Routine General Medical Exam@health  Care Facl  (ICD-V70.0) 10)  Family History Diabetes 1st Degree Relative  (ICD-V18.0) 11)  Mitral Valve Prolapse  (ICD-424.0) 12)  Hyperlipidemia  (ICD-272.4)  Medications Prior to Update: 1)  Acyclovir 800 Mg Tabs (Acyclovir) .... As Needed 2)  Multivitamins   Tabs (Multiple Vitamin) .... Take 1 By Mouth Qd 3)  Calcium-D 600-200 Mg-Unit  Tabs (Calcium Carbonate-Vitamin D) .... Take 2 By Mouth Qd 4)  Vitamin D 1000 Unit  Caps (Cholecalciferol) .... One By Mouth Daily 5)  Krill Oil 1000 Mg Caps (Krill Oil) .... Two By Mouth Two Times A Day 6)  Glucosamine 500 Mg Caps (Glucosamine Sulfate) 7)  Azithromycin 250 Mg Tabs (Azithromycin) .... 2 Tabs By Mouth Once and Then 1 Tab By Mouth Once Daily X 4 Days 8)  Ranitidine Hcl 150 Mg Caps (Ranitidine Hcl) .Marland Kitchen.. 1 Tab By Mouth Once Daily X 7 Days and Then As Needed Daily For Reflux and Nausea  Current Medications (verified): 1)  Acyclovir 800 Mg Tabs (  Acyclovir) .... As Needed 2)  Multivitamins   Tabs (Multiple Vitamin) .... Take 1 By Mouth Qd 3)  Calcium-D 600-200 Mg-Unit  Tabs (Calcium Carbonate-Vitamin D) .... Take 2 By Mouth Qd 4)  Vitamin D 1000 Unit  Caps (Cholecalciferol) .... One By Mouth Daily 5)  Krill Oil 1000 Mg Caps (Krill Oil) .... Two By Mouth Two Times A Day 6)  Glucosamine 500 Mg Caps (Glucosamine Sulfate) 7)  Tramadol Hcl 50 Mg Tabs (Tramadol Hcl) .... One By Mouth Three Times A Day As Needed Pain  Allergies (verified): No Known Drug Allergies  Past History:  Family History: Last updated: 08/05/2007 Family History Diabetes 1st degree  relative Fam hx CHF  Risk Factors: Smoking Status: never (10/28/2010)  Past medical, surgical, family and social histories (including risk factors) reviewed, and no changes noted (except as noted below).  Past Medical History: Reviewed history from 08/05/2007 and no changes required. Hyperlipidemia  Family History: Reviewed history from 08/05/2007 and no changes required. Family History Diabetes 1st degree relative Fam hx CHF  Social History: Reviewed history and no changes required.  Review of Systems  The patient denies anorexia, fever, weight loss, weight gain, vision loss, decreased hearing, hoarseness, chest pain, syncope, dyspnea on exertion, peripheral edema, prolonged cough, headaches, hemoptysis, abdominal pain, melena, hematochezia, severe indigestion/heartburn, hematuria, incontinence, genital sores, muscle weakness, suspicious skin lesions, transient blindness, difficulty walking, depression, unusual weight change, abnormal bleeding, enlarged lymph nodes, angioedema, and breast masses.    Physical Exam  General:  Well-developed,well-nourished,in no acute distress; alert,appropriate and cooperative throughout examination Head:  Normocephalic and atraumatic without obvious abnormalities. Eyes:  pupils equal and pupils round.   Nose:  External nasal examination shows no deformity or inflammation. Nasal mucosa are pink and moist without lesions or exudates. Mouth:  Oral mucosa and oropharynx without lesions or exudates.  Teeth in good repair. Neck:  No deformities, masses, or tenderness noted. Lungs:  Normal respiratory effort, chest expands symmetrically. Lungs are clear to auscultation, no crackles or wheezes. Heart:  Normal rate and regular rhythm. S1 and S2 normal without gallop, murmur, click, rub or other extra sounds. Abdomen:  Bowel sounds positive,abdomen soft and without masses, organomegaly or hernias noted. Slightly tender with palpation b/l LQ Msk:  No  deformity or scoliosis noted of thoracic or lumbar spine.   Pulses:  R and L carotid,radial,femoral,dorsalis pedis and posterior tibial pulses are full and equal bilaterally Extremities:  No clubbing, cyanosis, edema, or deformity noted with normal full range of motion of all joints.   Neurologic:  alert & oriented X3 and cranial nerves II-XII intact.     Impression & Recommendations:  Problem # 1:  ELEVATED BP READING WITHOUT DX HYPERTENSION (ICD-796.2)  the nsiads for the carple tunnel from occupational help   and causing this with referral to Dr Amanda Pea  BP today: 140/82 right  and 138/ 79  Prior BP: 138/90 (09/24/2010)  Labs Reviewed: Creat: 0.8 (09/24/2010) Chol: 209 (08/08/2010)   HDL: 58.80 (08/08/2010)   LDL: 118 (11/29/2008)   TG: 100.0 (08/08/2010)  Instructed in low sodium diet (DASH Handout) and behavior modification.    Problem # 2:  OTH DIURETICS CAUS ADVRS EFFECT THERAPEUTIC USE (ICD-E944.4) the nsiads in HTN  Problem # 3:  CARPAL TUNNEL SYNDROME, BILATERAL (ICD-354.0) referral to [facility], continues hand rehab needs injeciton but refused  due to occupational health work related claim Labs Reviewed: TSH: 0.73 (09/24/2010)     Complete Medication List: 1)  Acyclovir 800 Mg Tabs (Acyclovir) .Marland KitchenMarland KitchenMarland Kitchen  As needed 2)  Multivitamins Tabs (Multiple vitamin) .... Take 1 by mouth qd 3)  Calcium-d 600-200 Mg-unit Tabs (Calcium carbonate-vitamin d) .... Take 2 by mouth qd 4)  Vitamin D 1000 Unit Caps (Cholecalciferol) .... One by mouth daily 5)  Krill Oil 1000 Mg Caps (Krill oil) .... Two by mouth two times a day 6)  Glucosamine 500 Mg Caps (Glucosamine sulfate) 7)  Tramadol Hcl 50 Mg Tabs (Tramadol hcl) .... One by mouth three times a day as needed pain  Lipid Assessment/Plan:      Based on NCEP/ATP III, the patient's risk factor category is "0-1 risk factors".  The patient's lipid goals are as follows: Total cholesterol goal is 200; LDL cholesterol goal is 160; HDL cholesterol  goal is 40; Triglyceride goal is 150.  Her LDL cholesterol goal has been met.    Patient Instructions: 1)  moniter blood pressure 2-3 weeks out 2)  use the ultram for wrist pain or head ache 3)  call if blood pressure is persistant over 140/90 call 4)  Please schedule a follow-up appointment in 2 months. [facility] Prescriptions: TRAMADOL HCL 50 MG TABS (TRAMADOL HCL) one by mouth three times a day as needed pain  #50 x 0   Entered and Authorized by:   Stacie Glaze MD   Signed by:   Stacie Glaze MD on 10/28/2010   Method used:   Electronically to        [facility]* (retail)       [address]       [address] Shipping/mailing       [place], [place]  [address]       Ph: [phone]       Fax: [phone]   RxID:   [id number]    Orders Added: 1)  Est. Patient Level IV [42595]

## 2011-01-21 NOTE — Miscellaneous (Signed)
Summary: BONE DENSITY  Clinical Lists Changes  Orders: Added new Test order of T-Bone Densitometry (77080) - Signed Added new Test order of T-Lumbar Vertebral Assessment (77082) - Signed 

## 2011-01-21 NOTE — Progress Notes (Signed)
Summary: Pt called is req to get Vit D lvls added to [facility] labs  Phone Note Call from Patient Call back at Home Phone [phone]   Caller: Patient Reason for Call: Acute Illness Summary of Call: Pt called and is req to get Vit D lvls added to [facility] labs. Pls advise.  Initial call taken by: Lucy Antigua,  November 19, 2010 1:36 PM  Follow-up for Phone Call        ok to add Follow-up by: Willy Eddy, LPN,  November 19, 2010 1:39 PM  Additional Follow-up for Phone Call Additional follow up Details #1::        DONE.  Additional Follow-up by: Debbra Riding,  November 19, 2010 2:46 PM

## 2011-01-21 NOTE — Progress Notes (Signed)
  Phone Note Call from Patient Call back at Home Phone [phone]   Caller: Patient Call For: Stacie Glaze MD Summary of Call: BP 136/88 , 153/91/.135/93, 123/90 Highest 140/88  Concerned about HBP and wants to see Dr. Lovell Sheehan   Initial call taken by: Lynann Beaver CMA AAMA,  October 25, 2010 9:11 AM  Follow-up for Phone Call        ov  given for mon day Follow-up by: Willy Eddy, LPN,  October 25, 2010 9:41 AM

## 2011-01-23 NOTE — Progress Notes (Signed)
Summary: Pt says that order of dx needs to be corrected from 10/28/10  Phone Note Call from Patient Call back at Home Phone [phone]   Caller: Patient Summary of Call: Pt called and said that she rcvd a letter from [facility] re: date of service 10/28/10 order of dx and needs to be corrected and resubmitted, in order for insurance to pay. The carpal tunnel should be secondary, the primary dx should have been elevated bp due to medication. Pls advise.  Initial call taken by: Lucy Antigua,  December 18, 2010 5:00 PM

## 2011-02-13 ENCOUNTER — Other Ambulatory Visit: Payer: 59 | Admitting: Internal Medicine

## 2011-02-13 DIAGNOSIS — E785 Hyperlipidemia, unspecified: Secondary | ICD-10-CM

## 2011-02-13 DIAGNOSIS — Z0000 Encounter for general adult medical examination without abnormal findings: Secondary | ICD-10-CM

## 2011-02-13 LAB — BASIC METABOLIC PANEL
BUN: 13 mg/dL (ref 6–23)
CO2: 31 mEq/L (ref 19–32)
Calcium: 9.3 mg/dL (ref 8.4–10.5)
Glucose, Bld: 82 mg/dL (ref 70–99)
Potassium: 3.9 mEq/L (ref 3.5–5.1)
Sodium: 142 mEq/L (ref 135–145)

## 2011-02-13 LAB — POCT URINALYSIS DIPSTICK
Bilirubin, UA: NEGATIVE
Blood, UA: NEGATIVE
Glucose, UA: NEGATIVE
Ketones, UA: NEGATIVE
Spec Grav, UA: 1.005
Urobilinogen, UA: 0.2

## 2011-02-13 LAB — CBC WITH DIFFERENTIAL/PLATELET
Basophils Absolute: 0 10*3/uL (ref 0.0–0.1)
Eosinophils Absolute: 0.2 10*3/uL (ref 0.0–0.7)
HCT: 38.9 % (ref 36.0–46.0)
Hemoglobin: 13.3 g/dL (ref 12.0–15.0)
Lymphs Abs: 1.9 10*3/uL (ref 0.7–4.0)
MCHC: 34.1 g/dL (ref 30.0–36.0)
Monocytes Absolute: 0.4 10*3/uL (ref 0.1–1.0)
Neutro Abs: 5 10*3/uL (ref 1.4–7.7)
RDW: 13.3 % (ref 11.5–14.6)

## 2011-02-13 LAB — LIPID PANEL
Cholesterol: 211 mg/dL — ABNORMAL HIGH (ref 0–200)
VLDL: 13.8 mg/dL (ref 0.0–40.0)

## 2011-02-13 LAB — TSH: TSH: 0.95 u[IU]/mL (ref 0.35–5.50)

## 2011-02-13 LAB — HEPATIC FUNCTION PANEL: Albumin: 4.2 g/dL (ref 3.5–5.2)

## 2011-02-13 LAB — LDL CHOLESTEROL, DIRECT: Direct LDL: 134.4 mg/dL

## 2011-02-14 LAB — VITAMIN D 25 HYDROXY (VIT D DEFICIENCY, FRACTURES): Vit D, 25-Hydroxy: 40 ng/mL (ref 30–89)

## 2011-02-17 ENCOUNTER — Telehealth: Payer: Self-pay | Admitting: Internal Medicine

## 2011-02-17 NOTE — Telephone Encounter (Signed)
Per dr Lovell Sheehan- prefer to go over lab with pt at [facility] this week and then give her copy- dont want her to worry over something that is not necessary-pt informed

## 2011-02-17 NOTE — Telephone Encounter (Signed)
Pt would like a copy of her lab results. Please call when ready today.

## 2011-02-20 ENCOUNTER — Encounter: Payer: Self-pay | Admitting: Internal Medicine

## 2011-02-21 ENCOUNTER — Encounter: Payer: Self-pay | Admitting: Internal Medicine

## 2011-02-21 ENCOUNTER — Ambulatory Visit (INDEPENDENT_AMBULATORY_CARE_PROVIDER_SITE_OTHER): Payer: 59 | Admitting: Internal Medicine

## 2011-02-21 DIAGNOSIS — E785 Hyperlipidemia, unspecified: Secondary | ICD-10-CM

## 2011-02-21 DIAGNOSIS — Z0000 Encounter for general adult medical examination without abnormal findings: Secondary | ICD-10-CM

## 2011-02-21 MED ORDER — VITAMIN D 1000 UNITS PO CAPS: 1000.0000 [IU] | ORAL_CAPSULE | Freq: Two times a day (BID) | ORAL | Status: DC

## 2011-02-21 MED ORDER — KRILL OIL 1000 MG PO CAPS: 2.0000 | ORAL_CAPSULE | Freq: Two times a day (BID) | ORAL | Status: AC

## 2011-02-21 NOTE — Progress Notes (Signed)
  Subjective:    Patient ID: Cheyenne Myers, female    DOB: [birth year], 55 y.o.   MRN: [id number]  HPI  Cheyenne Myers presents for complete physical examination she is up-to-date with all health maintenance issues specifically mammography and bone density she has no complaints at this time she is also followed for hyperlipidemia and a strong family history of cardiovascular risk from elevated lipids.     Review of Systems  Constitutional: Negative for activity change, appetite change and fatigue.  HENT: Negative for ear pain, congestion, neck pain, postnasal drip and sinus pressure.   Eyes: Negative for redness and visual disturbance.  Respiratory: Negative for cough, shortness of breath and wheezing.   Gastrointestinal: Negative for abdominal pain and abdominal distention.  Genitourinary: Negative for dysuria, frequency and menstrual problem.  Musculoskeletal: Negative for myalgias, joint swelling and arthralgias.  Skin: Negative for rash and wound.  Neurological: Negative for dizziness, weakness and headaches.  Hematological: Negative for adenopathy. Does not bruise/bleed easily.  Psychiatric/Behavioral: Negative for sleep disturbance and decreased concentration.   Past Medical History  Diagnosis Date  . Hyperlipidemia    History reviewed. No pertinent past surgical history.  reports that she has never smoked. She does not have any smokeless tobacco history on file. She reports that she does not drink alcohol or use illicit drugs. family history includes Alzheimer's disease in her mother; Atrial fibrillation in her sister; Diabetes in her mother; Heart disease in her mother; and Stroke in her sister.        Objective:   Physical Exam  Constitutional: She is oriented to person, place, and time. She appears well-developed and well-nourished. No distress.  HENT:  Head: Normocephalic and atraumatic.  Right Ear: External ear normal.  Left Ear: External ear normal.  Nose: Nose normal.    Mouth/Throat: Oropharynx is clear and moist.  Eyes: Conjunctivae and EOM are normal. Pupils are equal, round, and reactive to light.  Neck: Normal range of motion. Neck supple. No JVD present. No tracheal deviation present. No thyromegaly present.  Cardiovascular: Normal rate, regular rhythm, normal heart sounds and intact distal pulses.   No murmur heard. Pulmonary/Chest: Effort normal and breath sounds normal. She has no wheezes. She exhibits no tenderness.  Abdominal: Soft. Bowel sounds are normal.  Musculoskeletal: Normal range of motion. She exhibits no edema and no tenderness.  Lymphadenopathy:    She has no cervical adenopathy.  Neurological: She is alert and oriented to person, place, and time. She has normal reflexes. No cranial nerve deficit.  Skin: Skin is warm and dry. She is not diaphoretic.  Psychiatric: She has a normal mood and affect. Her behavior is normal.          Assessment & Plan:   This is a routine physical examination for this healthy  Female. Reviewed all health maintenance protocols including mammography colonoscopy bone density and reviewed appropriate screening labs. Her immunization history was reviewed as well as her current medications and allergies refills of her chronic medications were given and the plan for yearly health maintenance was discussed all orders and referrals were made as appropriate.

## 2011-02-21 NOTE — Assessment & Plan Note (Signed)
Mild elevation above goal

## 2011-04-02 ENCOUNTER — Other Ambulatory Visit: Payer: Self-pay | Admitting: Internal Medicine

## 2011-04-02 DIAGNOSIS — Z1231 Encounter for screening mammogram for malignant neoplasm of breast: Secondary | ICD-10-CM

## 2011-05-09 NOTE — Op Note (Signed)
NAMEPEGGY, LOGE                   ACCOUNT NO.:  [id number]   MEDICAL RECORD NO.:  [id number]          PATIENT TYPE:  AMB   LOCATION:  ENDO                         FACILITY:  [facility]   PHYSICIAN:  Petra Kuba, M.D.    DATE OF BIRTH:  1956-12-11   DATE OF PROCEDURE:  11/12/2005  DATE OF DISCHARGE:                                 OPERATIVE REPORT   PROCEDURE:  Colonoscopy with biopsy.   INDICATIONS:  Screening.  Consent was signed after risks, benefits, methods,  options discussed in the past with her father and prior to any sedation.   MEDICINES USED:  Demerol 70, Versed 7.   PROCEDURE:  Rectal inspection is pertinent for external hemorrhoids, small.  Digital exam was negative.  Video pediatric adjustable colonoscope was  inserted and easily advanced around the colon to the cecum.  This did not  require any abdominal pressure or any position changes.  No abnormality was  seen on insertion.  Cecum was identified by the appendiceal orifice and the  ileocecal valve.  In fact, the scope was inserted a short ways into the  terminal ileum which was normal.  Photodocumentation was obtained; the scope  was slowly withdrawn.  Prep was adequate.  There was minimal liquid stool  that required washing and suctioning.  On slow withdrawal through the colon,  other than a tiny questionable hepatic flexure polyp which was cold biopsied  x2.  No other abnormalities were seen as we slowly withdrew back to the  rectum.  Anorectal pull-through and retroflexion confirmed some tiny to  small hemorrhoids.  The scope was straightened readvanced a short ways up  the left side of the colon; air was suctioned, scope removed.  The patient  tolerated the procedure well.  There was no obvious immediate complication.   ENDOSCOPIC DIAGNOSES:  1.  Internal-external tiny to small hemorrhoids.  2.  Questionable tiny hepatic flexure polyp, cold biopsied.  3.  Otherwise, within normal limits to the terminal ileum.   PLAN:  Await pathology but probably recheck colon screening in 5-10 years  unless this is an adenoma, then would in five.  Happy to see back p.r.n.  Otherwise, return care to Dr. Lovell Sheehan for the customary healthcare screening  and maintenance.           ______________________________  Petra Kuba, M.D.     MEM/MEDQ  D:  11/12/2005  T:  11/12/2005  Job:  [id number]

## 2011-05-12 ENCOUNTER — Ambulatory Visit
Admission: RE | Admit: 2011-05-12 | Discharge: 2011-05-12 | Disposition: A | Discharge status: Home / Self Care | Payer: 59 | Source: Ambulatory Visit | Attending: Internal Medicine | Admitting: Internal Medicine

## 2011-05-12 DIAGNOSIS — Z1231 Encounter for screening mammogram for malignant neoplasm of breast: Secondary | ICD-10-CM

## 2011-08-23 HISTORY — PX: CARPAL TUNNEL RELEASE: SHX101

## 2011-09-03 ENCOUNTER — Encounter (HOSPITAL_BASED_OUTPATIENT_CLINIC_OR_DEPARTMENT_OTHER)
Admission: RE | Admit: 2011-09-03 | Discharge: 2011-09-03 | Disposition: A | Discharge status: Home / Self Care | Payer: PRIVATE HEALTH INSURANCE | Source: Ambulatory Visit | Attending: Orthopedic Surgery | Admitting: Orthopedic Surgery

## 2011-09-05 ENCOUNTER — Ambulatory Visit (HOSPITAL_BASED_OUTPATIENT_CLINIC_OR_DEPARTMENT_OTHER)
Admission: RE | Admit: 2011-09-05 | Discharge: 2011-09-05 | Disposition: A | Discharge status: Home / Self Care | Payer: PRIVATE HEALTH INSURANCE | Source: Ambulatory Visit | Attending: Orthopedic Surgery | Admitting: Orthopedic Surgery

## 2011-09-05 DIAGNOSIS — Z01810 Encounter for preprocedural cardiovascular examination: Secondary | ICD-10-CM | POA: Insufficient documentation

## 2011-09-05 DIAGNOSIS — G5600 Carpal tunnel syndrome, unspecified upper limb: Secondary | ICD-10-CM | POA: Insufficient documentation

## 2011-09-05 DIAGNOSIS — Z01812 Encounter for preprocedural laboratory examination: Secondary | ICD-10-CM | POA: Insufficient documentation

## 2011-09-12 ENCOUNTER — Telehealth: Payer: Self-pay | Admitting: *Deleted

## 2011-09-12 NOTE — Telephone Encounter (Signed)
Flu shot made pt nauseated, with weakness and headache and wants it documented for [facility] so she will not have  To take it again.  Needs NOTE

## 2011-09-13 NOTE — Op Note (Signed)
NAMEJASPER, RUMINSKI                   ACCOUNT NO.:  [id number]  MEDICAL RECORD NO.:  [id number]  LOCATION:                                 FACILITY:  PHYSICIAN:  Dionne Ano. Amanda Pea, M.D.     DATE OF BIRTH:  DATE OF PROCEDURE: DATE OF DISCHARGE:                              OPERATIVE REPORT   PREOPERATIVE DIAGNOSIS:  Right carpal tunnel syndrome.  POSTOPERATIVE DIAGNOSIS:  Right carpal tunnel syndrome.  PROCEDURE: 1. Right median nerve//peripheral nerve block wrist-forearm level of     the fascia, purpose of this is for carpal tunnel release. 2. Right limited open carpal tunnel release.  SURGEON:  Dionne Ano. Amanda Pea, MD  ASSISTANT:  None.  COMPLICATIONS:  None.  ANESTHESIA:  Peripheral nerve block with IV sedation keeping the patient awake, alert and oriented during the entire case.  TOURNIQUET TIME:  Less than 10 minutes.  INDICATIONS FOR PROCEDURE:  This is a very pleasant 55 year old female presents for above-mentioned diagnosis.  I have counseled her in regards to risks and benefits of surgery including risk of infection, bleeding, anesthesia, damage to normal structures and failure of surgery, to accomplish its intended goals of relieving symptoms and restoring function.  With this in mind she desires to proceed.  All questions were encouraged and answered preoperatively.  OPERATIVE PROCEDURE:  The patient was seen by myself and Anesthesia, taken to the operating suite, underwent smooth induction of peripheral nerve/median nerve block.  Following this, the patient then underwent sterile prep and drape.  I used 18 mL of Sensorcaine lidocaine with epinephrine for the nerve block.  She tolerated this beautifully. Following this, she was prepped and draped in a sterile fashion. Betadine scrub paint.  Sterile field secured.  Arm was elevated and tourniquet was insufflated to 250 mmHg.  With final time-out was called and following this, I then made an incision about the  distal edge of transcarpal ligament.  This was a 1-cm incision coursing proximally. Dissection was carried down.  Palmar fascia incised.  Distal edge of transcarpal ligament was released under 4.0 loupe magnification without difficulty.  Fat pad egressed nicely.  The patient had full complete release distally verified.  Following this, distal to proximal dissection was carried until adequate room was available for canal preparatory device 1, 2 and 3 which were placed just under the proximal leading leaflet of the transverse carpal ligament.  The patient tolerated this well and there were no complicating features.  Once this was done, I placed a security clip.  The obturator disengaged and the patient was noted to be awake, alert and oriented during the entire process.  I then placed security knife and security clip effectively releasing the proximal leaflet of transcarpal ligament.  The patient tolerated this well and there were no difficulties.  I inspected the canal.  She was completely released, all looked quite well and therewere no complicating features.  I deflated the tourniquet and secured hemostasis with bipolar cautery and closed the wound with Prolene.  She had no complicating features.  Secure hemostasis and was awake, alert and oriented during all passes and had no discomfort.  She was dressed sterilely,  taken to recovery room.  We will see her back in a week, therapy in 12 days and notify us should any problems occur. It is a pleasure to see her today.  Do's and dont's discussed and all questions encouraged and answered.  I discussed with her that given the dense numbness in her hand it is difficult to ascertain when the numbness will be back to normal or if it worth it ever will, but our goal today of course was to provide her with the most optimal environment for a nerve recovering health.  She understands this do's and dont's etc, and all questions have been encouraged  and answered.  DISCHARGE MEDICINES:  Vicodin, vitamin C 1000 mg a day, vitamin B6 200 mg a day as well as Peri-Colace to prevent constipation.     Dionne Ano. Amanda Pea, M.D.     [facility]  D:  09/05/2011  T:  09/05/2011  Job:  [id number]  Electronically Signed by Dominica Severin M.D. on 09/13/2011 01:33:40 PM

## 2011-09-24 LAB — URINALYSIS, ROUTINE W REFLEX MICROSCOPIC
Bilirubin Urine: NEGATIVE
Ketones, ur: 15 — AB
Protein, ur: NEGATIVE
Urobilinogen, UA: 0.2

## 2011-09-24 LAB — CBC
HCT: 40
Hemoglobin: 13.3
MCHC: 33.3
MCV: 93.4
RBC: 4.28

## 2011-10-15 ENCOUNTER — Ambulatory Visit: Payer: PRIVATE HEALTH INSURANCE | Attending: Orthopedic Surgery

## 2011-10-15 DIAGNOSIS — M25649 Stiffness of unspecified hand, not elsewhere classified: Secondary | ICD-10-CM | POA: Insufficient documentation

## 2011-10-15 DIAGNOSIS — IMO0001 Reserved for inherently not codable concepts without codable children: Secondary | ICD-10-CM | POA: Insufficient documentation

## 2011-10-15 DIAGNOSIS — M6281 Muscle weakness (generalized): Secondary | ICD-10-CM | POA: Insufficient documentation

## 2011-10-15 DIAGNOSIS — M25519 Pain in unspecified shoulder: Secondary | ICD-10-CM | POA: Insufficient documentation

## 2012-01-23 ENCOUNTER — Encounter: Payer: Self-pay | Admitting: Internal Medicine

## 2012-01-23 ENCOUNTER — Ambulatory Visit (INDEPENDENT_AMBULATORY_CARE_PROVIDER_SITE_OTHER): Payer: 59 | Admitting: Internal Medicine

## 2012-01-23 DIAGNOSIS — M5412 Radiculopathy, cervical region: Secondary | ICD-10-CM

## 2012-01-23 DIAGNOSIS — M7989 Other specified soft tissue disorders: Secondary | ICD-10-CM

## 2012-01-23 MED ORDER — PREDNISONE 20 MG PO TABS: 20.0000 mg | ORAL_TABLET | Freq: Every day | ORAL | Status: AC

## 2012-01-23 MED ORDER — BACLOFEN 10 MG PO TABS: 10.0000 mg | ORAL_TABLET | Freq: Three times a day (TID) | ORAL | Status: AC

## 2012-01-23 NOTE — Patient Instructions (Signed)
The patient is instructed to continue all medications as prescribed. Schedule followup with check out clerk upon leaving the clinic  

## 2012-01-23 NOTE — Progress Notes (Signed)
  Subjective:    Patient ID: Cheyenne Myers, female    DOB: 1956/04/15, 56 y.o.   MRN: [id number]  HPI July awoke with swelling in hand and fingers. Was diagnosed with carpale  tunnel through EMG and NCV. Was seen and had surgery of right hand. The orthopedist did MRI and labs and she has not had surgery on the left. The symptoms are similar in both hands.  Use of the hand results in intermittent quivering. Neck MRI showed multilevel spondylosis at C4-C5 C5-C6 and C6-C7 with disc spur complex mild foraminal narrowing at C4-5 and possibly C6-7. There was a suggestion of cord effacement at that level.    Review of Systems  Constitutional: Negative for activity change, appetite change and fatigue.  HENT: Negative for ear pain, congestion, neck pain, postnasal drip and sinus pressure.   Eyes: Negative for redness and visual disturbance.  Respiratory: Negative for cough, shortness of breath and wheezing.   Gastrointestinal: Negative for abdominal pain and abdominal distention.  Genitourinary: Negative for dysuria, frequency and menstrual problem.  Musculoskeletal: Positive for myalgias, joint swelling and arthralgias.  Skin: Negative for rash and wound.  Neurological: Positive for numbness. Negative for dizziness, weakness and headaches.  Hematological: Negative for adenopathy. Does not bruise/bleed easily.  Psychiatric/Behavioral: Negative for sleep disturbance and decreased concentration.       Objective:   Physical Exam  Nursing note and vitals reviewed. Constitutional: She is oriented to person, place, and time. She appears well-developed and well-nourished. No distress.  HENT:  Head: Normocephalic and atraumatic.  Right Ear: External ear normal.  Left Ear: External ear normal.  Nose: Nose normal.  Mouth/Throat: Oropharynx is clear and moist.  Eyes: Conjunctivae and EOM are normal. Pupils are equal, round, and reactive to light.  Neck: Normal range of motion. Neck supple. No JVD  present. No tracheal deviation present. No thyromegaly present.  Cardiovascular: Normal rate, regular rhythm, normal heart sounds and intact distal pulses.   No murmur heard. Pulmonary/Chest: Effort normal and breath sounds normal. She has no wheezes. She exhibits no tenderness.  Abdominal: Soft. Bowel sounds are normal.  Musculoskeletal: Normal range of motion. She exhibits no edema and no tenderness.  Lymphadenopathy:    She has no cervical adenopathy.  Neurological: She is alert and oriented to person, place, and time. She has normal reflexes. No cranial nerve deficit.       Carpal tunnel release scar on the right  Skin: Skin is warm and dry. She is not diaphoretic.  Psychiatric: She has a normal mood and affect. Her behavior is normal.          Assessment & Plan:  The differential diagnosis includes cervical disc disease with compression syndrome persistent carpal tunnel were other central neurologic disease of the CNS  ( MS?) Brother then start with an MRI of the brain I believe we should start with a neurology consult and I will refer her to Dr. Fayrene Fearing love. Make sure that he has a copy of the EMG nerve conduction velocity and the MRI studies. In the interim I will place her on a burst and taper over 2 weeks of prednisone and a muscle relaxant to see if some of the hand sensation improves significantly which would help to confirm the diagnosis of cervical disease. Certainly as the carpal tunnel is work related to cervical disc disease may be work related from use of the computer.

## 2012-01-26 ENCOUNTER — Telehealth: Payer: Self-pay | Admitting: *Deleted

## 2012-01-26 DIAGNOSIS — R200 Anesthesia of skin: Secondary | ICD-10-CM

## 2012-01-26 NOTE — Telephone Encounter (Signed)
Please advise 

## 2012-01-26 NOTE — Telephone Encounter (Signed)
Pt saw Dr. Lovell Sheehan on Friday due to numbness and tingling in fingers and hands and was going to be referred to Dr. Sandria Manly.  Over the weekend the numbness and tingling has increased and is now feeling in feet and lower legs.  Pt called Dr. Imagene Gurney office to check on status of referral and was advised it takes several wks for appt unless Dr. Lovell Sheehan submits the order as urgent.  Pt wants to know if she should see an ortho since she is also having neck pain.  Please advise.

## 2012-01-27 NOTE — Telephone Encounter (Signed)
Pt will talk with her boss and see if injury is  workers comp related

## 2012-01-27 NOTE — Telephone Encounter (Signed)
Per dr Lovell Sheehan- may see dr Shon Baton at  [facility] ortho

## 2012-01-30 ENCOUNTER — Telehealth: Payer: Self-pay | Admitting: *Deleted

## 2012-01-30 NOTE — Telephone Encounter (Signed)
Per dr Lovell Sheehan- take 20 mg for 2 days and 10 m g for 2 days and then she may stop

## 2012-01-30 NOTE — Telephone Encounter (Signed)
Pt is taking Prednisone from Dr. Lovell Sheehan for tingling in her hands and feet..  Now, she is having the tingling in her face and lips, and wants to stop the Prednisone.   Today is her first day down from 40 mg to 20 mg.

## 2012-01-30 NOTE — Telephone Encounter (Signed)
Left message for patient

## 2012-02-10 ENCOUNTER — Ambulatory Visit: Payer: 59 | Attending: Orthopedic Surgery | Admitting: Physical Therapy

## 2012-02-10 DIAGNOSIS — M6281 Muscle weakness (generalized): Secondary | ICD-10-CM | POA: Insufficient documentation

## 2012-02-10 DIAGNOSIS — M2560 Stiffness of unspecified joint, not elsewhere classified: Secondary | ICD-10-CM | POA: Insufficient documentation

## 2012-02-10 DIAGNOSIS — M542 Cervicalgia: Secondary | ICD-10-CM | POA: Insufficient documentation

## 2012-02-10 DIAGNOSIS — IMO0001 Reserved for inherently not codable concepts without codable children: Secondary | ICD-10-CM | POA: Insufficient documentation

## 2012-02-10 DIAGNOSIS — R293 Abnormal posture: Secondary | ICD-10-CM | POA: Insufficient documentation

## 2012-02-20 ENCOUNTER — Encounter: Payer: Self-pay | Admitting: Physical Therapy

## 2012-02-23 ENCOUNTER — Telehealth: Payer: Self-pay | Admitting: Internal Medicine

## 2012-02-23 NOTE — Telephone Encounter (Signed)
Pt would like to have vit d added to cpx labs. Can I sch?

## 2012-02-23 NOTE — Telephone Encounter (Signed)
I added it-thanks

## 2012-02-25 ENCOUNTER — Ambulatory Visit: Payer: Self-pay | Admitting: Internal Medicine

## 2012-02-27 ENCOUNTER — Encounter: Payer: Self-pay | Admitting: Physical Therapy

## 2012-03-01 ENCOUNTER — Other Ambulatory Visit (INDEPENDENT_AMBULATORY_CARE_PROVIDER_SITE_OTHER): Payer: 59

## 2012-03-01 DIAGNOSIS — Z0000 Encounter for general adult medical examination without abnormal findings: Secondary | ICD-10-CM

## 2012-03-01 LAB — CBC WITH DIFFERENTIAL/PLATELET
Basophils Absolute: 0 10*3/uL (ref 0.0–0.1)
Eosinophils Absolute: 0 10*3/uL (ref 0.0–0.7)
Lymphocytes Relative: 18.7 % (ref 12.0–46.0)
MCHC: 33.4 g/dL (ref 30.0–36.0)
MCV: 93.2 fl (ref 78.0–100.0)
Monocytes Absolute: 0.5 10*3/uL (ref 0.1–1.0)
Neutro Abs: 4.8 10*3/uL (ref 1.4–7.7)
Neutrophils Relative %: 73.2 % (ref 43.0–77.0)
RDW: 13 % (ref 11.5–14.6)

## 2012-03-01 LAB — POCT URINALYSIS DIPSTICK
Bilirubin, UA: NEGATIVE
Blood, UA: NEGATIVE
Ketones, UA: NEGATIVE
Leukocytes, UA: NEGATIVE
Protein, UA: NEGATIVE
pH, UA: 7

## 2012-03-01 LAB — BASIC METABOLIC PANEL
CO2: 29 mEq/L (ref 19–32)
Calcium: 9.3 mg/dL (ref 8.4–10.5)
Chloride: 103 mEq/L (ref 96–112)
Creatinine, Ser: 0.7 mg/dL (ref 0.4–1.2)
Glucose, Bld: 90 mg/dL (ref 70–99)

## 2012-03-01 LAB — HEPATIC FUNCTION PANEL
Albumin: 4.1 g/dL (ref 3.5–5.2)
Alkaline Phosphatase: 22 U/L — ABNORMAL LOW (ref 39–117)
Bilirubin, Direct: 0.1 mg/dL (ref 0.0–0.3)
Total Protein: 6.8 g/dL (ref 6.0–8.3)

## 2012-03-01 LAB — LIPID PANEL
Cholesterol: 218 mg/dL — ABNORMAL HIGH (ref 0–200)
HDL: 66.6 mg/dL (ref >39.00)
Triglycerides: 104 mg/dL (ref 0.0–149.0)

## 2012-03-05 ENCOUNTER — Encounter: Payer: Self-pay | Admitting: Physical Therapy

## 2012-03-08 ENCOUNTER — Encounter: Payer: Self-pay | Admitting: Internal Medicine

## 2012-03-08 ENCOUNTER — Ambulatory Visit (INDEPENDENT_AMBULATORY_CARE_PROVIDER_SITE_OTHER): Payer: 59 | Admitting: Internal Medicine

## 2012-03-08 VITALS — BP 120/78 | HR 72 | Temp 98.3°F | Resp 16 | Ht 61.0 in | Wt 116.0 lb

## 2012-03-08 DIAGNOSIS — Z0000 Encounter for general adult medical examination without abnormal findings: Secondary | ICD-10-CM

## 2012-03-08 NOTE — Progress Notes (Signed)
Subjective:    Patient ID: Cheyenne Myers, female    DOB: [birth year], 56 y.o.   MRN: [id number]  HPI cpx    Review of Systems  Constitutional: Negative for activity change, appetite change and fatigue.  HENT: Negative for ear pain, congestion, neck pain, postnasal drip and sinus pressure.   Eyes: Negative for redness and visual disturbance.  Respiratory: Negative for cough, shortness of breath and wheezing.   Gastrointestinal: Negative for abdominal pain and abdominal distention.  Genitourinary: Negative for dysuria, frequency and menstrual problem.  Musculoskeletal: Negative for myalgias, joint swelling and arthralgias.  Skin: Negative for rash and wound.  Neurological: Negative for dizziness, weakness and headaches.  Hematological: Negative for adenopathy. Does not bruise/bleed easily.  Psychiatric/Behavioral: Negative for sleep disturbance and decreased concentration.   Past Medical History  Diagnosis Date  . Hyperlipidemia     History   Social History  . Marital Status: Married    Spouse Name: N/A    Number of Children: N/A  . Years of Education: N/A   Occupational History  . Not on file.   Social History Main Topics  . Smoking status: Never Smoker   . Smokeless tobacco: Not on file  . Alcohol Use: No  . Drug Use: No  . Sexually Active: Yes   Other Topics Concern  . Not on file   Social History Narrative  . No narrative on file    No past surgical history on file.  Family History  Problem Relation Age of Onset  . Heart disease Mother   . Diabetes Mother   . Alzheimer's disease Mother   . Stroke Sister     tia and pacer  . Atrial fibrillation Sister     Allergies  Allergen Reactions  . Fluzone (Flu Virus Vaccine) Rash    Pt refuses to continue with flue shots to to localized skin reaction    Current Outpatient Prescriptions on File Prior to Visit  Medication Sig Dispense Refill  . acyclovir (ZOVIRAX) 800 MG tablet Take 800 mg by mouth as needed.         . Cholecalciferol (VITAMIN D) 1000 UNITS capsule Take 1 capsule (1,000 Units total) by mouth 2 (two) times daily.      Marland Kitchen glucosamine-chondroitin 500-400 MG tablet Take 1 tablet by mouth every other day.       Marland Kitchen KRILL OIL 1000 MG CAPS Take 2 capsules (2,000 mg total) by mouth 2 (two) times daily.        BP 120/78  Pulse 72  Temp 98.3 F (36.8 C)  Resp 16  Ht 5\' 1"  (1.549 m)  Wt 116 lb (52.617 kg)  BMI 21.92 kg/m2        Objective:   Physical Exam  Constitutional: She is oriented to person, place, and time. She appears well-developed and well-nourished. No distress.  HENT:  Head: Normocephalic and atraumatic.  Right Ear: External ear normal.  Left Ear: External ear normal.  Nose: Nose normal.  Mouth/Throat: Oropharynx is clear and moist.  Eyes: Conjunctivae and EOM are normal. Pupils are equal, round, and reactive to light.  Neck: Normal range of motion. Neck supple. No JVD present. No tracheal deviation present. No thyromegaly present.  Cardiovascular: Normal rate, regular rhythm, normal heart sounds and intact distal pulses.   No murmur heard. Pulmonary/Chest: Effort normal and breath sounds normal. She has no wheezes. She exhibits no tenderness.  Abdominal: Soft. Bowel sounds are normal.  Musculoskeletal: Normal range of motion. She exhibits  no edema and no tenderness.  Lymphadenopathy:    She has no cervical adenopathy.  Neurological: She is alert and oriented to person, place, and time. She has normal reflexes. No cranial nerve deficit.  Skin: Skin is warm and dry. She is not diaphoretic.  Psychiatric: She has a normal mood and affect. Her behavior is normal.          Assessment & Plan:    This is a routine physical examination for this healthy  Female. Reviewed all health maintenance protocols including mammography colonoscopy bone density and reviewed appropriate screening labs. Her immunization history was reviewed as well as her current medications and  allergies refills of her chronic medications were given and the plan for yearly health maintenance was discussed all orders and referrals were made as appropriate.  In physical therapy for chronic cervical arthritis and spondylosis

## 2012-03-08 NOTE — Patient Instructions (Signed)
The patient is instructed to continue all medications as prescribed. Schedule followup with check out clerk upon leaving the clinic  

## 2012-03-23 ENCOUNTER — Ambulatory Visit (INDEPENDENT_AMBULATORY_CARE_PROVIDER_SITE_OTHER): Payer: 59 | Admitting: Family

## 2012-03-23 ENCOUNTER — Encounter: Payer: Self-pay | Admitting: Family

## 2012-03-23 ENCOUNTER — Telehealth: Payer: Self-pay | Admitting: Internal Medicine

## 2012-03-23 VITALS — BP 128/86 | HR 61 | Temp 98.3°F | Wt 116.0 lb

## 2012-03-23 DIAGNOSIS — F419 Anxiety disorder, unspecified: Secondary | ICD-10-CM

## 2012-03-23 DIAGNOSIS — R5381 Other malaise: Secondary | ICD-10-CM

## 2012-03-23 DIAGNOSIS — R200 Anesthesia of skin: Secondary | ICD-10-CM

## 2012-03-23 DIAGNOSIS — R209 Unspecified disturbances of skin sensation: Secondary | ICD-10-CM

## 2012-03-23 DIAGNOSIS — F411 Generalized anxiety disorder: Secondary | ICD-10-CM

## 2012-03-23 DIAGNOSIS — R5383 Other fatigue: Secondary | ICD-10-CM

## 2012-03-23 NOTE — Telephone Encounter (Signed)
Dr Lovell Sheehan is not here in pm,  He really wants her to see padonda

## 2012-03-23 NOTE — Patient Instructions (Signed)

## 2012-03-23 NOTE — Telephone Encounter (Signed)
Patient called stating that she is having diarrhea, weakness and chills and would like to see Dr. Lovell Sheehan this afternoon. I offered her another provider and she refused. Please advise.

## 2012-03-23 NOTE — Progress Notes (Signed)
Subjective:    Patient ID: Cheyenne Myers, female    DOB: [birth year], 56 y.o.   MRN: [id number]  HPI 56 year old white female, patient of Dr. Lovell Sheehan is in today with complaints of fatigue, nausea, achy, numbness and tingling, and loose stools, neck pain, dizziness and lightheadedness. She believes that she may have Lyme's disease. However, she has no recent exposure to ticks. She has seen neurology was diagnosed with Carpel Tunnel syndrome. She's had a carpal tunnel release, that did not help her symptoms. She's very concerned and worried that something is wrong with her body and nobody is able to find out what it is. Her colonoscopy is up-to-date. She will routinely exercises. Her complete physical was 3 weeks ago and all labs were completely normal. Her mammogram is due next month.   Review of Systems  Constitutional: Positive for fatigue.  Respiratory: Negative.   Cardiovascular: Negative.   Gastrointestinal: Positive for diarrhea.  Genitourinary: Negative.   Musculoskeletal: Positive for back pain.  Neurological: Positive for light-headedness, numbness and headaches.  Hematological: Negative.   Psychiatric/Behavioral: The patient is nervous/anxious.    Past Medical History  Diagnosis Date  . Hyperlipidemia     History   Social History  . Marital Status: Married    Spouse Name: N/A    Number of Children: N/A  . Years of Education: N/A   Occupational History  . Not on file.   Social History Main Topics  . Smoking status: Never Smoker   . Smokeless tobacco: Not on file  . Alcohol Use: No  . Drug Use: No  . Sexually Active: Yes   Other Topics Concern  . Not on file   Social History Narrative  . No narrative on file    No past surgical history on file.  Family History  Problem Relation Age of Onset  . Heart disease Mother   . Diabetes Mother   . Alzheimer's disease Mother   . Stroke Sister     tia and pacer  . Atrial fibrillation Sister     Allergies    Allergen Reactions  . Fluzone (Flu Virus Vaccine) Rash    Pt refuses to continue with flue shots to to localized skin reaction    Current Outpatient Prescriptions on File Prior to Visit  Medication Sig Dispense Refill  . acyclovir (ZOVIRAX) 800 MG tablet Take 800 mg by mouth as needed.        . Cholecalciferol (VITAMIN D) 1000 UNITS capsule Take 1 capsule (1,000 Units total) by mouth 2 (two) times daily.      Marland Kitchen glucosamine-chondroitin 500-400 MG tablet Take 1 tablet by mouth every other day.       Marland Kitchen KRILL OIL 1000 MG CAPS Take 2 capsules (2,000 mg total) by mouth 2 (two) times daily.        BP 128/86  Pulse 61  Temp(Src) 98.3 F (36.8 C) (Oral)  Wt 116 lb (52.617 kg)  SpO2 98%chart    Objective:   Physical Exam  Constitutional: She is oriented to person, place, and time. She appears well-developed and well-nourished.  HENT:  Right Ear: External ear normal.  Left Ear: External ear normal.  Nose: Nose normal.  Mouth/Throat: Oropharynx is clear and moist.  Neck: Normal range of motion. Neck supple.  Cardiovascular: Normal rate, regular rhythm and normal heart sounds.   Pulmonary/Chest: Effort normal and breath sounds normal.  Abdominal: Soft. Bowel sounds are normal.  Musculoskeletal: Normal range of motion.  Neurological: She is alert  and oriented to person, place, and time.  Skin: Skin is warm and dry.  Psychiatric:       Tearful and very anxious          Assessment & Plan:  Assessment: Fatigue, nausea, numbness and tingling  Plan: Underlying, I strongly believe that her symptoms are related to anxiety. I have discussed this with the patient and advised her to think about it. Information leaflet was given. She will continue to followup with neurology. At her request, we'll send a Lyme's disease titer. I have advised a CBC, patient declines that test per the lab technician. Patient to follow up with her PCP as scheduled and sooner when necessary. I think she would  benefit from an SSRI.

## 2012-03-24 LAB — B. BURGDORFI ANTIBODIES: B burgdorferi Ab IgG+IgM: 0.15 {ISR}

## 2012-03-29 ENCOUNTER — Telehealth: Payer: Self-pay | Admitting: Internal Medicine

## 2012-03-29 NOTE — Telephone Encounter (Signed)
PT INFORMED AND      INFORMED RESULTS WERE MAILED BY TAMESIA

## 2012-03-29 NOTE — Telephone Encounter (Signed)
Pt called req lab results. Pls call.  °

## 2012-04-12 ENCOUNTER — Telehealth: Payer: Self-pay | Admitting: Internal Medicine

## 2012-04-12 NOTE — Telephone Encounter (Signed)
Pt called and said that she has extremely dry mouth, dry eyes, hard swallowing, weakness, diarrhea, nausea. Pt refuses to see another doctor. Req pcp only sometime in the next few days.

## 2012-04-12 NOTE — Telephone Encounter (Signed)
Ov 4-24 at 11:15--pt informed

## 2012-04-14 ENCOUNTER — Encounter: Payer: Self-pay | Admitting: Internal Medicine

## 2012-04-14 ENCOUNTER — Ambulatory Visit (INDEPENDENT_AMBULATORY_CARE_PROVIDER_SITE_OTHER): Payer: 59 | Admitting: Internal Medicine

## 2012-04-14 VITALS — BP 130/80 | HR 68 | Temp 98.2°F | Resp 16 | Ht 61.0 in | Wt 116.0 lb

## 2012-04-14 DIAGNOSIS — F439 Reaction to severe stress, unspecified: Secondary | ICD-10-CM

## 2012-04-14 DIAGNOSIS — F430 Acute stress reaction: Secondary | ICD-10-CM

## 2012-04-14 MED ORDER — DULOXETINE HCL 30 MG PO CPEP: 30.0000 mg | ORAL_CAPSULE | Freq: Every day | ORAL | Status: DC

## 2012-04-14 NOTE — Progress Notes (Signed)
  Subjective:    Patient ID: Cheyenne Myers, female    DOB: Dec 04, 1956, 56 y.o.   MRN: [id number]  HPI patient has multiple symptoms some of which are related to increased stress but they are a wide spectrum of symptomology including episodic diarrhea which has since resolved persistent dry mouth dry eyes loss of appetite anorexia and jitteriness.  Her blood pressure stable her vital signs are stable she has not lost additional weight. No night sweats. Random chills. Her primary symptom has been overwhelming fatigue that is episodic. Persistent numbness and tingling.  She is also seeing a neurologist and had an EMG completed this week B12 was normal EMG was reported normal additional lab work is pending    Review of Systems  Constitutional: Positive for fatigue. Negative for activity change and appetite change.  HENT: Negative for ear pain, congestion, neck pain, postnasal drip and sinus pressure.   Eyes: Negative for redness and visual disturbance.  Respiratory: Negative for cough, shortness of breath and wheezing.   Cardiovascular: Positive for palpitations.  Gastrointestinal: Negative for abdominal pain and abdominal distention.  Genitourinary: Negative for dysuria, frequency and menstrual problem.  Musculoskeletal: Positive for myalgias, joint swelling and arthralgias.  Skin: Negative for rash and wound.  Neurological: Positive for weakness. Negative for dizziness and headaches.  Hematological: Negative for adenopathy. Does not bruise/bleed easily.  Psychiatric/Behavioral: Negative for disturbed wake/sleep cycle and decreased concentration. The patient is nervous/anxious.        Objective:   Physical Exam  Nursing note and vitals reviewed. Constitutional: She appears well-developed and well-nourished.  HENT:  Head: Normocephalic and atraumatic.  Eyes: Conjunctivae normal and EOM are normal. Pupils are equal, round, and reactive to light.  Neck: Normal range of motion. Neck  supple.  Cardiovascular: Normal rate and regular rhythm.   No murmur heard. Abdominal: Soft. Bowel sounds are normal.          Assessment & Plan:  Patient has an acute stress reaction with multiple additional symptoms she seen a neurologist for further evaluation including EMG and nerve conduction velocities she has had episodic diarrhea which is suggestive of irritable bowel. The differential diagnosis includes mixed connective tissue disease and we'll proceed with workup she also has history of mitral valve prolapse which can contribute to anxiety and panic disorder.  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

## 2012-04-14 NOTE — Patient Instructions (Signed)
The patient is instructed to continue all medications as prescribed. Schedule followup with check out clerk upon leaving the clinic  

## 2012-04-19 ENCOUNTER — Telehealth: Payer: Self-pay | Admitting: *Deleted

## 2012-04-19 NOTE — Telephone Encounter (Signed)
Notified pt. 

## 2012-04-19 NOTE — Telephone Encounter (Signed)
Line busy x 3 times.

## 2012-04-19 NOTE — Telephone Encounter (Signed)
Pt call this am complaining of extreme dizziness, blurred vision, feels like a hot stick in her back spreading pain into all directions... All body feels jittery.  What should she do?  Call Dr. Sandria Manly ? Needs advice.

## 2012-04-19 NOTE — Telephone Encounter (Signed)
Ok per dr jenkins 

## 2012-04-19 NOTE — Telephone Encounter (Signed)
Has appt. With Dr. Sandria Manly this afternoon.  Stopped the Cymbalta 2 days after starting it.  It made her nauseated and felt bad all over.

## 2012-04-19 NOTE — Telephone Encounter (Signed)
Per dr Lovell Sheehan- may stop cymbalta to see if that will help

## 2012-05-03 ENCOUNTER — Ambulatory Visit: Payer: Self-pay | Admitting: Internal Medicine

## 2012-05-07 ENCOUNTER — Other Ambulatory Visit: Payer: Self-pay | Admitting: Internal Medicine

## 2012-05-07 DIAGNOSIS — Z1231 Encounter for screening mammogram for malignant neoplasm of breast: Secondary | ICD-10-CM

## 2012-05-21 ENCOUNTER — Ambulatory Visit: Payer: Self-pay

## 2012-06-01 ENCOUNTER — Ambulatory Visit: Payer: Self-pay

## 2012-06-07 ENCOUNTER — Ambulatory Visit (INDEPENDENT_AMBULATORY_CARE_PROVIDER_SITE_OTHER): Payer: 59 | Admitting: Internal Medicine

## 2012-06-07 ENCOUNTER — Encounter: Payer: Self-pay | Admitting: Internal Medicine

## 2012-06-07 VITALS — BP 120/80 | HR 68 | Temp 98.2°F | Resp 14 | Ht 61.0 in | Wt 121.0 lb

## 2012-06-07 DIAGNOSIS — M5414 Radiculopathy, thoracic region: Secondary | ICD-10-CM

## 2012-06-07 DIAGNOSIS — G5600 Carpal tunnel syndrome, unspecified upper limb: Secondary | ICD-10-CM

## 2012-06-07 DIAGNOSIS — E785 Hyperlipidemia, unspecified: Secondary | ICD-10-CM

## 2012-06-07 DIAGNOSIS — IMO0002 Reserved for concepts with insufficient information to code with codable children: Secondary | ICD-10-CM

## 2012-06-07 MED ORDER — CELECOXIB 200 MG PO CAPS: 200.0000 mg | ORAL_CAPSULE | Freq: Two times a day (BID) | ORAL | Status: AC

## 2012-06-07 NOTE — Patient Instructions (Addendum)
Trial of celebrex

## 2012-06-07 NOTE — Progress Notes (Signed)
  Subjective:    Patient ID: Cheyenne Myers, female    DOB: Jul 13, 1956, 56 y.o.   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  HENT: Negative for ear pain, congestion, neck pain, postnasal drip and sinus pressure.   Eyes: Negative for redness and visual disturbance.  Respiratory: Negative for cough, shortness of breath and wheezing.   Gastrointestinal: Negative for abdominal pain and abdominal distention.  Genitourinary: Negative for dysuria, frequency and menstrual problem.  Musculoskeletal: Positive for myalgias and arthralgias. Negative for joint swelling.  Skin: Negative for rash and wound.  Neurological: Negative for dizziness, weakness and headaches.  Hematological: Negative for adenopathy. Does not bruise/bleed easily.  Psychiatric/Behavioral: Negative for disturbed wake/sleep cycle and decreased concentration.       Objective:   Physical Exam  Nursing note and vitals reviewed. Constitutional: She is oriented to person, place, and time. She appears well-developed and well-nourished. No distress.  HENT:  Head: Normocephalic and atraumatic.  Right Ear: External ear normal.  Left Ear: External ear normal.  Nose: Nose normal.  Mouth/Throat: Oropharynx is clear and moist.  Eyes: Conjunctivae and EOM are normal. Pupils are equal, round, and reactive to light.  Neck: Normal range of motion. Neck supple. No JVD present. No tracheal deviation present. No thyromegaly present.  Cardiovascular: Normal rate, regular rhythm, normal heart sounds and intact distal pulses.   No murmur heard. Pulmonary/Chest: Effort normal and breath  sounds normal. She has no wheezes. She exhibits no tenderness.  Abdominal: Soft. Bowel sounds are normal.  Musculoskeletal: She exhibits tenderness.  Lymphadenopathy:    She has no cervical adenopathy.  Neurological: She is alert and oriented to person, place, and time. She has normal reflexes. No cranial nerve deficit.  Skin: Skin is warm and dry. She is not diaphoretic.  Psychiatric: She has a normal mood and affect. Her behavior is normal.          Assessment & Plan:  The patient has classic ulnar nerve neuropathy even though EMG's were normal I also believe she has t1-2 upper thoracic symptoms  Trial of baclofen and medrol dose pack Follow with celebrex

## 2012-06-09 ENCOUNTER — Ambulatory Visit: Payer: Self-pay | Admitting: Internal Medicine

## 2012-06-15 ENCOUNTER — Telehealth: Payer: Self-pay | Admitting: Internal Medicine

## 2012-06-15 NOTE — Telephone Encounter (Signed)
Left message on machine.

## 2012-06-15 NOTE — Telephone Encounter (Signed)
Caller: Samirah/Patient; PCP: Darryll Capers; CB#: [phone]; ; ; Call regarding Medication Report;  Pt is calling to let MD know that she has taken Celebrax as ordered x 1 week. Pt is calling with an update. Pt was having numbness and tingling in her hands and a sore/weak neck. Pt is calling to report she has noticed no difference. Physical therapist has set up appt for next week. Is there something else she can do ? or take? Is she still to continue seeing Dr. Sandria Manly?

## 2012-06-15 NOTE — Telephone Encounter (Signed)
Caller: Lyndsee/Patient; Phone Number: [phone]; Message from caller: Patient returning a call to [place]. Also states her appointment with Dr Sandria Manly is not until the end of August and wants to know if she needs to be seen by someone else before that.

## 2012-06-15 NOTE — Telephone Encounter (Signed)
Pt informed dr Lovell Sheehan out of office this pm and will look at message

## 2012-06-16 ENCOUNTER — Ambulatory Visit
Admission: RE | Admit: 2012-06-16 | Discharge: 2012-06-16 | Disposition: A | Discharge status: Home / Self Care | Payer: 59 | Source: Ambulatory Visit | Attending: Internal Medicine | Admitting: Internal Medicine

## 2012-06-16 DIAGNOSIS — Z1231 Encounter for screening mammogram for malignant neoplasm of breast: Secondary | ICD-10-CM

## 2012-06-16 NOTE — Telephone Encounter (Signed)
Pt states she had made an appointment for [facility] in July- instructed to call if she needs referral

## 2012-06-16 NOTE — Telephone Encounter (Signed)
Could send to [facility] sooner

## 2012-06-22 ENCOUNTER — Ambulatory Visit: Payer: 59 | Attending: Internal Medicine | Admitting: Physical Therapy

## 2012-06-22 DIAGNOSIS — M542 Cervicalgia: Secondary | ICD-10-CM | POA: Insufficient documentation

## 2012-06-22 DIAGNOSIS — M2569 Stiffness of other specified joint, not elsewhere classified: Secondary | ICD-10-CM | POA: Insufficient documentation

## 2012-06-22 DIAGNOSIS — IMO0002 Reserved for concepts with insufficient information to code with codable children: Secondary | ICD-10-CM | POA: Insufficient documentation

## 2012-06-22 DIAGNOSIS — IMO0001 Reserved for inherently not codable concepts without codable children: Secondary | ICD-10-CM | POA: Insufficient documentation

## 2012-06-28 ENCOUNTER — Other Ambulatory Visit: Payer: Self-pay | Admitting: Dermatology

## 2012-07-01 ENCOUNTER — Ambulatory Visit (HOSPITAL_COMMUNITY)
Admission: RE | Admit: 2012-07-01 | Discharge: 2012-07-01 | Disposition: A | Discharge status: Home / Self Care | Payer: 59 | Source: Ambulatory Visit | Attending: Psychiatry | Admitting: Psychiatry

## 2012-07-01 ENCOUNTER — Encounter: Payer: Self-pay | Admitting: Physical Therapy

## 2012-07-01 ENCOUNTER — Other Ambulatory Visit (HOSPITAL_COMMUNITY): Payer: Self-pay | Admitting: Psychiatry

## 2012-07-01 DIAGNOSIS — I729 Aneurysm of unspecified site: Secondary | ICD-10-CM

## 2012-07-01 DIAGNOSIS — M47812 Spondylosis without myelopathy or radiculopathy, cervical region: Secondary | ICD-10-CM | POA: Insufficient documentation

## 2012-07-01 DIAGNOSIS — I771 Stricture of artery: Secondary | ICD-10-CM | POA: Insufficient documentation

## 2012-07-01 MED ORDER — IOHEXOL 350 MG/ML SOLN
100.0000 mL | Freq: Once | INTRAVENOUS | Status: AC | PRN
  Administered 2012-07-01: 100 mL via INTRAVENOUS

## 2012-07-02 ENCOUNTER — Telehealth: Payer: Self-pay | Admitting: Internal Medicine

## 2012-07-02 NOTE — Telephone Encounter (Signed)
Pt informed no aneurysm per dr Lovell Sheehan- ct faxed to dr Jule Ser per pt request

## 2012-07-02 NOTE — Telephone Encounter (Signed)
Caller: [name]/Patient; PCP: Darryll Capers; CB#: [phone]; ; ; Call regarding Requesting CAT Scan Results From 07/01/12;   Patient states she had CAT scan of neck done at [facility] 07/01/12. States results were being sent to Dr. Lovell Sheehan. Patient calling requesting CAT scan results.  RN reviewed that results are available in Epic EHR.  PLEASE RETURN CALL TO PATIENT AT [phone] OR [phone] WITH CAT SCAN RESULTS FROM 07/01/12.

## 2012-08-19 ENCOUNTER — Other Ambulatory Visit: Payer: Self-pay | Admitting: Dermatology

## 2012-09-07 ENCOUNTER — Telehealth: Payer: Self-pay | Admitting: Internal Medicine

## 2012-09-07 NOTE — Telephone Encounter (Signed)
Upon calling this patient to reschedule her bumped appt, patient stated that she has an appt with a specialist and she need to see Dr. Lovell Sheehan at least the week of 10/11/12. Please assist.

## 2012-09-07 NOTE — Telephone Encounter (Signed)
Bonnye, on this one - pt cannot come in 10/8. Needs to be wk of 10/21. Any chance Dr. Lovell Sheehan could do an 8:45 some time that wk? Sorry for all the requests!

## 2012-09-07 NOTE — Telephone Encounter (Signed)
Latoya, I'm not sure Bonnye & I are understanding this message. She can't come in on the 8th?

## 2012-09-07 NOTE — Telephone Encounter (Signed)
Try 10-24 at 11:45-thanks

## 2012-09-07 NOTE — Telephone Encounter (Signed)
Patient can not come in on the 8th

## 2012-09-07 NOTE — Telephone Encounter (Signed)
Latoya, please see below from [facility] for date/time to sched. THANKS!!!!!

## 2012-09-07 NOTE — Telephone Encounter (Signed)
Please help-can you please find a place for her?

## 2012-09-13 ENCOUNTER — Ambulatory Visit: Payer: Self-pay | Admitting: Internal Medicine

## 2012-09-20 ENCOUNTER — Ambulatory Visit: Payer: Self-pay | Admitting: Internal Medicine

## 2012-10-14 ENCOUNTER — Ambulatory Visit (INDEPENDENT_AMBULATORY_CARE_PROVIDER_SITE_OTHER): Payer: 59 | Admitting: Internal Medicine

## 2012-10-14 VITALS — BP 110/70 | HR 72 | Temp 98.2°F | Resp 16 | Ht 61.0 in | Wt 124.0 lb

## 2012-10-14 DIAGNOSIS — S161XXA Strain of muscle, fascia and tendon at neck level, initial encounter: Secondary | ICD-10-CM

## 2012-10-14 DIAGNOSIS — Z23 Encounter for immunization: Secondary | ICD-10-CM

## 2012-10-14 DIAGNOSIS — M47812 Spondylosis without myelopathy or radiculopathy, cervical region: Secondary | ICD-10-CM

## 2012-10-14 DIAGNOSIS — S139XXA Sprain of joints and ligaments of unspecified parts of neck, initial encounter: Secondary | ICD-10-CM

## 2012-10-14 NOTE — Progress Notes (Signed)
  Subjective:    Patient ID: Cheyenne Myers, female    DOB: 17-Jul-1956, 56 y.o.   MRN: [id number]  HPI  Multilevel cervical arthritis Has a hx of reaction to the flu vaccine  Review of Systems  Constitutional: Negative for activity change, appetite change and fatigue.  HENT: Negative for ear pain, congestion, neck pain, postnasal drip and sinus pressure.   Eyes: Negative for redness and visual disturbance.  Respiratory: Negative for cough, shortness of breath and wheezing.   Gastrointestinal: Negative for abdominal pain and abdominal distention.  Genitourinary: Negative for dysuria, frequency and menstrual problem.  Musculoskeletal: Positive for myalgias and arthralgias. Negative for joint swelling.  Skin: Negative for rash and wound.  Neurological: Positive for numbness. Negative for dizziness, weakness and headaches.  Hematological: Negative for adenopathy. Does not bruise/bleed easily.  Psychiatric/Behavioral: Negative for sleep disturbance and decreased concentration.       Objective:   Physical Exam  Constitutional: She is oriented to person, place, and time. She appears well-developed and well-nourished. No distress.  HENT:  Head: Normocephalic and atraumatic.  Right Ear: External ear normal.  Left Ear: External ear normal.  Nose: Nose normal.  Mouth/Throat: Oropharynx is clear and moist.  Eyes: Conjunctivae normal and EOM are normal. Pupils are equal, round, and reactive to light.  Neck: Normal range of motion. Neck supple. No JVD present. No tracheal deviation present. No thyromegaly present.  Cardiovascular: Normal rate, regular rhythm, normal heart sounds and intact distal pulses.   No murmur heard. Pulmonary/Chest: Effort normal and breath sounds normal. She has no wheezes. She exhibits no tenderness.  Abdominal: Soft. Bowel sounds are normal.  Musculoskeletal: Normal range of motion. She exhibits no edema and no tenderness.  Lymphadenopathy:    She has no cervical  adenopathy.  Neurological: She is alert and oriented to person, place, and time. She has normal reflexes. No cranial nerve deficit.  Skin: Skin is warm and dry. She is not diaphoretic.  Psychiatric: She has a normal mood and affect. Her behavior is normal.          Assessment & Plan:  Dicussed therapy for cervical neck pain Had neuropathic pain and neuropathy from spondylosis

## 2013-01-04 ENCOUNTER — Encounter: Payer: Self-pay | Admitting: Internal Medicine

## 2013-01-21 ENCOUNTER — Encounter: Payer: Self-pay | Admitting: Internal Medicine

## 2013-01-21 ENCOUNTER — Ambulatory Visit (INDEPENDENT_AMBULATORY_CARE_PROVIDER_SITE_OTHER): Payer: 59 | Admitting: Internal Medicine

## 2013-01-21 VITALS — BP 120/80 | HR 68 | Temp 97.8°F | Wt 124.0 lb

## 2013-01-21 DIAGNOSIS — M5412 Radiculopathy, cervical region: Secondary | ICD-10-CM

## 2013-01-21 DIAGNOSIS — R200 Anesthesia of skin: Secondary | ICD-10-CM

## 2013-01-21 DIAGNOSIS — R209 Unspecified disturbances of skin sensation: Secondary | ICD-10-CM

## 2013-01-21 DIAGNOSIS — G5600 Carpal tunnel syndrome, unspecified upper limb: Secondary | ICD-10-CM

## 2013-01-21 NOTE — Progress Notes (Signed)
Subjective:    Patient ID: Cheyenne Myers, female    DOB: March 27, 1956, 57 y.o.   MRN: [id number]  HPI Patient has cervical arthritis with radiculopathy complicated by mild carpal tunnel probably generated by overuse and occupational use of a computer in tight space.  She continues to participate in exercise and physical therapy which has not alleviated the issue but has slowed the progression   Review of Systems     Past Medical History  Diagnosis Date  . Hyperlipidemia     History   Social History  . Marital Status: Married    Spouse Name: N/A    Number of Children: N/A  . Years of Education: N/A   Occupational History  . Not on file.   Social History Main Topics  . Smoking status: Never Smoker   . Smokeless tobacco: Not on file  . Alcohol Use: No  . Drug Use: No  . Sexually Active: Yes   Other Topics Concern  . Not on file   Social History Narrative  . No narrative on file    No past surgical history on file.  Family History  Problem Relation Age of Onset  . Heart disease Mother   . Diabetes Mother   . Alzheimer's disease Mother   . Stroke Sister     tia and pacer  . Atrial fibrillation Sister     Allergies  Allergen Reactions  . Fluzone (Flu Virus Vaccine) Rash    Pt refuses to continue with flue shots to to localized skin reaction    Current Outpatient Prescriptions on File Prior to Visit  Medication Sig Dispense Refill  . acyclovir (ZOVIRAX) 800 MG tablet Take 800 mg by mouth as needed.        . Cholecalciferol (VITAMIN D) 1000 UNITS capsule Take 1 capsule (1,000 Units total) by mouth 2 (two) times daily.      Marland Kitchen glucosamine-chondroitin 500-400 MG tablet Take 1 tablet by mouth every other day.       Marland Kitchen KRILL OIL 1000 MG CAPS Take 2 capsules (2,000 mg total) by mouth 2 (two) times daily.        BP 120/80  Pulse 68  Temp 97.8 F (36.6 C) (Oral)  Wt 124 lb (56.246 kg)    Objective:   Physical Exam  Nursing note and vitals  reviewed. Constitutional: She is oriented to person, place, and time. She appears well-developed and well-nourished. No distress.  HENT:  Head: Normocephalic and atraumatic.  Right Ear: External ear normal.  Left Ear: External ear normal.  Nose: Nose normal.  Mouth/Throat: Oropharynx is clear and moist.  Eyes: Conjunctivae normal and EOM are normal. Pupils are equal, round, and reactive to light.  Neck: Normal range of motion. Neck supple. No JVD present. No tracheal deviation present. No thyromegaly present.  Cardiovascular: Normal rate, regular rhythm, normal heart sounds and intact distal pulses.   No murmur heard. Pulmonary/Chest: Effort normal and breath sounds normal. She has no wheezes. She exhibits no tenderness.  Abdominal: Soft. Bowel sounds are normal.  Musculoskeletal: Normal range of motion. She exhibits no edema and no tenderness.  Lymphadenopathy:    She has no cervical adenopathy.  Neurological: She is alert and oriented to person, place, and time. She has normal reflexes. No cranial nerve deficit.  Skin: Skin is warm and dry. She is not diaphoretic.  Psychiatric: She has a normal mood and affect. Her behavior is normal.          Assessment &  Plan:  Neurosurgical consult for her cervical radiculopathy.  She is participating in self physical therapy and Pilates which has slowed the progression of her symptomology.  One of the things that exacerbate her pain is flexion of the neck.

## 2013-04-04 ENCOUNTER — Ambulatory Visit (INDEPENDENT_AMBULATORY_CARE_PROVIDER_SITE_OTHER): Payer: 59 | Admitting: Internal Medicine

## 2013-04-04 ENCOUNTER — Encounter: Payer: Self-pay | Admitting: Internal Medicine

## 2013-04-04 VITALS — BP 110/70 | HR 68 | Temp 98.2°F | Resp 16 | Ht 61.0 in | Wt 122.0 lb

## 2013-04-04 DIAGNOSIS — M5412 Radiculopathy, cervical region: Secondary | ICD-10-CM

## 2013-04-04 DIAGNOSIS — G609 Hereditary and idiopathic neuropathy, unspecified: Secondary | ICD-10-CM

## 2013-04-04 NOTE — Patient Instructions (Signed)
The patient is instructed to continue all medications as prescribed. Schedule followup with check out clerk upon leaving the clinic  

## 2013-04-04 NOTE — Progress Notes (Signed)
  Subjective:    Patient ID: Cheyenne Myers, female    DOB: [birth year], 57 y.o.   MRN: [id number]  HPI Neck and neuropathy stable. Worsened with sitting and working at the computer Sitting typing and reading worsens the numbness. Standing and stretching helps Weakness in arms noted    Review of Systems  Constitutional: Negative for activity change, appetite change and fatigue.  HENT: Negative for ear pain, congestion, neck pain, postnasal drip and sinus pressure.   Eyes: Negative for redness and visual disturbance.  Respiratory: Negative for cough, shortness of breath and wheezing.   Gastrointestinal: Negative for abdominal pain and abdominal distention.  Genitourinary: Negative for dysuria, frequency and menstrual problem.  Musculoskeletal: Positive for back pain and gait problem. Negative for joint swelling and arthralgias.  Skin: Negative for rash and wound.  Neurological: Positive for tremors and weakness. Negative for dizziness and headaches.  Hematological: Negative for adenopathy. Does not bruise/bleed easily.  Psychiatric/Behavioral: Negative for sleep disturbance and decreased concentration.       Objective:   Physical Exam  Nursing note and vitals reviewed. Constitutional: She is oriented to person, place, and time. She appears well-developed and well-nourished. No distress.  HENT:  Head: Normocephalic and atraumatic.  Right Ear: External ear normal.  Left Ear: External ear normal.  Nose: Nose normal.  Mouth/Throat: Oropharynx is clear and moist.  Eyes: Conjunctivae and EOM are normal. Pupils are equal, round, and reactive to light.  Neck: Normal range of motion. Neck supple. No JVD present. No tracheal deviation present. No thyromegaly present.  Cardiovascular: Normal rate, regular rhythm, normal heart sounds and intact distal pulses.   No murmur heard. Pulmonary/Chest: Effort normal and breath sounds normal. She has no wheezes. She exhibits no tenderness.  Abdominal:  Soft. Bowel sounds are normal.  Musculoskeletal: Normal range of motion. She exhibits no edema and no tenderness.  Lymphadenopathy:    She has no cervical adenopathy.  Neurological: She is alert and oriented to person, place, and time. She displays abnormal reflex. No cranial nerve deficit. She exhibits abnormal muscle tone. Coordination abnormal.  Skin: Skin is warm and dry. She is not diaphoretic.  Psychiatric: She has a normal mood and affect. Her behavior is normal.          Assessment & Plan:  moderately severe cervical spondylosis with neuropathic pain Exercising and stretching with relaxation and work breaks Consider length of work stretching

## 2013-04-06 ENCOUNTER — Encounter: Payer: Self-pay | Admitting: *Deleted

## 2013-05-12 ENCOUNTER — Other Ambulatory Visit: Payer: Self-pay

## 2013-05-12 DIAGNOSIS — Z1231 Encounter for screening mammogram for malignant neoplasm of breast: Secondary | ICD-10-CM

## 2013-05-30 ENCOUNTER — Other Ambulatory Visit (INDEPENDENT_AMBULATORY_CARE_PROVIDER_SITE_OTHER): Payer: 59

## 2013-05-30 DIAGNOSIS — Z0000 Encounter for general adult medical examination without abnormal findings: Secondary | ICD-10-CM

## 2013-05-30 DIAGNOSIS — E785 Hyperlipidemia, unspecified: Secondary | ICD-10-CM

## 2013-05-30 LAB — LIPID PANEL
Cholesterol: 196 mg/dL (ref 0–200)
HDL: 52.5 mg/dL (ref >39.00)
LDL Cholesterol: 126 mg/dL — ABNORMAL HIGH (ref 0–99)
Triglycerides: 90 mg/dL (ref 0.0–149.0)
VLDL: 18 mg/dL (ref 0.0–40.0)

## 2013-05-30 LAB — HEPATIC FUNCTION PANEL
AST: 21 U/L (ref 0–37)
Albumin: 4.1 g/dL (ref 3.5–5.2)
Alkaline Phosphatase: 25 U/L — ABNORMAL LOW (ref 39–117)
Bilirubin, Direct: 0.1 mg/dL (ref 0.0–0.3)
Total Protein: 7.2 g/dL (ref 6.0–8.3)

## 2013-05-30 LAB — POCT URINALYSIS DIPSTICK
Bilirubin, UA: NEGATIVE
Ketones, UA: NEGATIVE
Leukocytes, UA: NEGATIVE
Protein, UA: NEGATIVE
Spec Grav, UA: 1.01
pH, UA: 7

## 2013-05-30 LAB — CBC WITH DIFFERENTIAL/PLATELET
Basophils Absolute: 0 10*3/uL (ref 0.0–0.1)
Eosinophils Relative: 1 % (ref 0.0–5.0)
HCT: 40.4 % (ref 36.0–46.0)
Lymphs Abs: 1.2 10*3/uL (ref 0.7–4.0)
Monocytes Relative: 6.8 % (ref 3.0–12.0)
Neutrophils Relative %: 69 % (ref 43.0–77.0)
Platelets: 250 10*3/uL (ref 150.0–400.0)
RDW: 13 % (ref 11.5–14.6)
WBC: 5.3 10*3/uL (ref 4.5–10.5)

## 2013-05-30 LAB — BASIC METABOLIC PANEL
BUN: 13 mg/dL (ref 6–23)
Calcium: 9.5 mg/dL (ref 8.4–10.5)
Creatinine, Ser: 0.7 mg/dL (ref 0.4–1.2)
GFR: 90.27 mL/min (ref >60.00)
Glucose, Bld: 81 mg/dL (ref 70–99)

## 2013-06-06 ENCOUNTER — Ambulatory Visit (INDEPENDENT_AMBULATORY_CARE_PROVIDER_SITE_OTHER): Payer: 59 | Admitting: Internal Medicine

## 2013-06-06 ENCOUNTER — Encounter: Payer: Self-pay | Admitting: Internal Medicine

## 2013-06-06 VITALS — BP 112/78 | HR 64 | Temp 98.2°F | Ht 62.0 in | Wt 122.0 lb

## 2013-06-06 DIAGNOSIS — Z0000 Encounter for general adult medical examination without abnormal findings: Secondary | ICD-10-CM

## 2013-06-06 NOTE — Progress Notes (Signed)
Subjective:    Patient ID: Cheyenne Myers, female    DOB: 06-Sep-1956, 57 y.o.   MRN: [id number]  HPI  CPX Basic neuropathy from cervical disc dz Has remained at 32 hours    Review of Systems  Constitutional: Negative for activity change, appetite change and fatigue.  HENT: Positive for neck pain and neck stiffness. Negative for ear pain, congestion, postnasal drip and sinus pressure.   Eyes: Negative for redness and visual disturbance.  Respiratory: Negative for cough, shortness of breath and wheezing.   Gastrointestinal: Negative for abdominal pain and abdominal distention.  Genitourinary: Negative for dysuria, frequency and menstrual problem.  Musculoskeletal: Negative for myalgias, joint swelling and arthralgias.       Grip strength reduced neuropathy of fingers  Skin: Negative for rash and wound.  Neurological: Negative for dizziness, weakness and headaches.  Hematological: Negative for adenopathy. Does not bruise/bleed easily.  Psychiatric/Behavioral: Negative for sleep disturbance and decreased concentration.   Past Medical History  Diagnosis Date  . Hyperlipidemia     History   Social History  . Marital Status: Married    Spouse Name: N/A    Number of Children: N/A  . Years of Education: N/A   Occupational History  . Not on file.   Social History Main Topics  . Smoking status: Never Smoker   . Smokeless tobacco: Not on file  . Alcohol Use: No  . Drug Use: No  . Sexually Active: Yes   Other Topics Concern  . Not on file   Social History Narrative  . No narrative on file    No past surgical history on file.  Family History  Problem Relation Age of Onset  . Heart disease Mother   . Diabetes Mother   . Alzheimer's disease Mother   . Stroke Sister     tia and pacer  . Atrial fibrillation Sister     Allergies  Allergen Reactions  . Fluzone (Flu Virus Vaccine) Rash    Pt refuses to continue with flue shots to to localized skin reaction    Current  Outpatient Prescriptions on File Prior to Visit  Medication Sig Dispense Refill  . acyclovir (ZOVIRAX) 800 MG tablet Take 800 mg by mouth as needed.        . Cholecalciferol (VITAMIN D) 1000 UNITS capsule Take 1 capsule (1,000 Units total) by mouth 2 (two) times daily.      Marland Kitchen KRILL OIL 1000 MG CAPS Take 2 capsules (2,000 mg total) by mouth 2 (two) times daily.       No current facility-administered medications on file prior to visit.    BP 112/78  Pulse 64  Temp(Src) 98.2 F (36.8 C) (Oral)  Ht 5\' 2"  (1.575 m)  Wt 122 lb (55.339 kg)  BMI 22.31 kg/m2        Objective:   Physical Exam  Nursing note and vitals reviewed. Constitutional: She is oriented to person, place, and time. She appears well-developed and well-nourished. No distress.  HENT:  Head: Normocephalic and atraumatic.  Right Ear: External ear normal.  Left Ear: External ear normal.  Nose: Nose normal.  Mouth/Throat: Oropharynx is clear and moist.  Eyes: Conjunctivae and EOM are normal. Pupils are equal, round, and reactive to light.  Neck: Normal range of motion. Neck supple. No JVD present. No tracheal deviation present. No thyromegaly present.  Cardiovascular: Normal rate, regular rhythm, normal heart sounds and intact distal pulses.   No murmur heard. Pulmonary/Chest: Effort normal and breath sounds  normal. She has no wheezes. She exhibits no tenderness.  Abdominal: Soft. Bowel sounds are normal.  Musculoskeletal: Normal range of motion. She exhibits no edema and no tenderness.  Lymphadenopathy:    She has no cervical adenopathy.  Neurological: She is alert and oriented to person, place, and time. She has normal reflexes. No cranial nerve deficit.  Numbness of fingers bilaterally  Skin: Skin is warm and dry. She is not diaphoretic.          Assessment & Plan:   This is a routine physical examination for this healthy  Female. Reviewed all health maintenance protocols including mammography colonoscopy bone  density and reviewed appropriate screening labs. Her immunization history was reviewed as well as her current medications and allergies refills of her chronic medications were given and the plan for yearly health maintenance was discussed all orders and referrals were made as appropriate. Take vit D

## 2013-06-06 NOTE — Patient Instructions (Signed)
The patient is instructed to continue all medications as prescribed. Schedule followup with check out clerk upon leaving the clinic  

## 2013-06-22 ENCOUNTER — Ambulatory Visit
Admission: RE | Admit: 2013-06-22 | Discharge: 2013-06-22 | Disposition: A | Discharge status: Home / Self Care | Payer: 59 | Source: Ambulatory Visit

## 2013-06-22 DIAGNOSIS — Z1231 Encounter for screening mammogram for malignant neoplasm of breast: Secondary | ICD-10-CM

## 2013-07-04 ENCOUNTER — Other Ambulatory Visit: Payer: Self-pay | Admitting: Dermatology

## 2013-08-24 ENCOUNTER — Telehealth: Payer: Self-pay | Admitting: Internal Medicine

## 2013-08-24 NOTE — Telephone Encounter (Signed)
Pt needs flu shot done in a split. Is it ok if she comes in Monday afternoon 2:30pm due to work? Pt will also be in on Monday, afternoon,  9/15 for the other half.

## 2013-08-29 ENCOUNTER — Ambulatory Visit (INDEPENDENT_AMBULATORY_CARE_PROVIDER_SITE_OTHER): Payer: 59 | Admitting: Internal Medicine

## 2013-08-29 DIAGNOSIS — Z23 Encounter for immunization: Secondary | ICD-10-CM

## 2013-09-05 ENCOUNTER — Ambulatory Visit: Payer: Self-pay | Admitting: Internal Medicine

## 2013-10-05 ENCOUNTER — Encounter: Payer: Self-pay | Admitting: Internal Medicine

## 2013-10-05 ENCOUNTER — Ambulatory Visit (INDEPENDENT_AMBULATORY_CARE_PROVIDER_SITE_OTHER): Payer: 59 | Admitting: Internal Medicine

## 2013-10-05 VITALS — BP 120/70 | HR 72 | Temp 98.2°F | Resp 16 | Ht 62.0 in | Wt 122.0 lb

## 2013-10-05 DIAGNOSIS — M542 Cervicalgia: Secondary | ICD-10-CM

## 2013-10-05 MED ORDER — GABAPENTIN 100 MG PO CAPS: 100.0000 mg | ORAL_CAPSULE | Freq: Two times a day (BID) | ORAL | Status: DC

## 2013-10-05 MED ORDER — LIDOCAINE 5 % EX PTCH: 2.0000 | MEDICATED_PATCH | CUTANEOUS | Status: DC

## 2013-10-05 NOTE — Progress Notes (Signed)
  Subjective:    Patient ID: Cheyenne Myers, female    DOB: [birth year], 57 y.o.   MRN: [id number]  HPI Seeing for carpal tunnel   Dr Amanda Pea    Review of Systems  Constitutional: Negative for activity change, appetite change and fatigue.  HENT: Negative for congestion, ear pain, postnasal drip and sinus pressure.        Neck pain and numbness  Eyes: Negative for redness and visual disturbance.  Respiratory: Negative for cough, shortness of breath and wheezing.   Gastrointestinal: Negative for abdominal pain and abdominal distention.  Genitourinary: Negative for dysuria, frequency and menstrual problem.  Musculoskeletal: Positive for arthralgias, neck pain and neck stiffness. Negative for joint swelling and myalgias.  Skin: Negative for rash and wound.  Neurological: Negative for dizziness, weakness and headaches.  Hematological: Negative for adenopathy. Does not bruise/bleed easily.  Psychiatric/Behavioral: Negative for sleep disturbance and decreased concentration.       Objective:   Physical Exam  Nursing note and vitals reviewed. Constitutional: She is oriented to person, place, and time. She appears well-developed and well-nourished. No distress.  HENT:  Head: Normocephalic and atraumatic.  Eyes: Conjunctivae and EOM are normal. Pupils are equal, round, and reactive to light.  Neck: Normal range of motion. Neck supple. No JVD present. No tracheal deviation present. No thyromegaly present.  Cardiovascular: Normal rate and regular rhythm.   No murmur heard. Pulmonary/Chest: Effort normal and breath sounds normal. She has no wheezes. She exhibits no tenderness.  Abdominal: Soft. Bowel sounds are normal.  Musculoskeletal: She exhibits edema and tenderness.  Neck pain increased  increasd tenderness and numbness and shooting pain  Lymphadenopathy:    She has no cervical adenopathy.  Neurological: She is alert and oriented to person, place, and time. She has normal reflexes. No  cranial nerve deficit.  Skin: Skin is warm and dry. She is not diaphoretic.  Psychiatric: She has a normal mood and affect. Her behavior is normal.          Assessment & Plan:  Neck pain is increased and she is concerned about when to seek neurosurgical consultation

## 2013-12-27 ENCOUNTER — Ambulatory Visit (INDEPENDENT_AMBULATORY_CARE_PROVIDER_SITE_OTHER): Payer: 59 | Admitting: Nurse Practitioner

## 2013-12-27 ENCOUNTER — Encounter: Payer: Self-pay | Admitting: Nurse Practitioner

## 2013-12-27 VITALS — BP 120/82 | HR 64 | Ht 61.25 in | Wt 122.0 lb

## 2013-12-27 DIAGNOSIS — IMO0002 Reserved for concepts with insufficient information to code with codable children: Secondary | ICD-10-CM

## 2013-12-27 DIAGNOSIS — Z01419 Encounter for gynecological examination (general) (routine) without abnormal findings: Secondary | ICD-10-CM

## 2013-12-27 DIAGNOSIS — Z0000 Encounter for general adult medical examination without abnormal findings: Secondary | ICD-10-CM

## 2013-12-27 DIAGNOSIS — N952 Postmenopausal atrophic vaginitis: Secondary | ICD-10-CM

## 2013-12-27 LAB — POCT URINALYSIS DIPSTICK
Bilirubin, UA: NEGATIVE
Glucose, UA: NEGATIVE
Ketones, UA: NEGATIVE
Nitrite, UA: NEGATIVE
PROTEIN UA: NEGATIVE
RBC UA: NEGATIVE
UROBILINOGEN UA: NEGATIVE
pH, UA: 5

## 2013-12-27 MED ORDER — ESTRADIOL 0.1 MG/GM VA CREA: TOPICAL_CREAM | VAGINAL | Status: DC

## 2013-12-27 NOTE — Progress Notes (Signed)
Patient ID: Cheyenne Myers, female   DOB: 1956-07-02, 58 y.o.   MRN: [id number] 58 y.o. [id number] Married Caucasian Fe here for annual exam.  Some mild vaso symptoms. Increase in vaginal dryness.  Some help with EVOO. Wants to try other treatment options.  No LMP recorded. Patient is postmenopausal.          Sexually active: yes  The current method of family planning is post menopausal status.    Exercising: yes  Gym/ health club routine includes yoga and walking and pilates. Smoker:  no  Health Maintenance: Pap:  12/13/12, WNL MMG:  06/22/13, Bi-Rads 2: benign findings, repeat in one year Colonoscopy:  10/2005, repeat in 10 years BMD:   01/17/10, Normal TDaP:  11/21/06  Labs:  HB: PCP Urine:  Negative   reports that she has never smoked. She does not have any smokeless tobacco history on file. She reports that she does not drink alcohol or use illicit drugs.  Past Medical History  Diagnosis Date  . Abnormal Pap smear 06/2004    ASCUS, + HPV, CIN II LEEP    Past Surgical History  Procedure Laterality Date  . Colposcopy    . Cervical biopsy  w/ loop electrode excision  06/2004    CIN II  . Carpal tunnel release Right 08/2011  . Wisdom tooth extraction  age 39    Current Outpatient Prescriptions  Medication Sig Dispense Refill  . Cholecalciferol (VITAMIN D) 1000 UNITS capsule Take 1 capsule (1,000 Units total) by mouth 2 (two) times daily.      . Glucosamine 750 MG TABS Take 2 tablets by mouth daily.      Marland Kitchen KRILL OIL 1000 MG CAPS Take 2 capsules (2,000 mg total) by mouth 2 (two) times daily.      Marland Kitchen acyclovir (ZOVIRAX) 800 MG tablet Take 800 mg by mouth as needed.        Marland Kitchen estradiol (ESTRACE) 0.1 MG/GM vaginal cream Use 1 gm intravaginally twice weekly for 3 months  42.5 g  3  . gabapentin (NEURONTIN) 100 MG capsule Take 1 capsule (100 mg total) by mouth 2 (two) times daily.  60 capsule  2  . lidocaine (LIDODERM) 5 % Place 2 patches onto the skin daily. Remove & Discard patch within 12 hours  or as directed by MD  60 patch  3   No current facility-administered medications for this visit.    Family History  Problem Relation Age of Onset  . Heart disease Mother   . Diabetes Mother   . Alzheimer's disease Mother   . Stroke Sister     tia and pacer  . Atrial fibrillation Sister   . Transient ischemic attack Sister     early alzhemeir    ROS:  Pertinent items are noted in HPI.  Otherwise, a comprehensive ROS was negative.  Exam:   BP 120/82  Pulse 64  Ht 5' 1.25" (1.556 m)  Wt 122 lb (55.339 kg)  BMI 22.86 kg/m2 Height: 5' 1.25" (155.6 cm)  Ht Readings from Last 3 Encounters:  12/27/13 5' 1.25" (1.556 m)  10/05/13 5\' 2"  (1.575 m)  06/06/13 5\' 2"  (1.575 m)    General appearance: alert, cooperative and appears stated age Head: Normocephalic, without obvious abnormality, atraumatic Neck: no adenopathy, supple, symmetrical, trachea midline and thyroid normal to inspection and palpation Lungs: clear to auscultation bilaterally Breasts: normal appearance, no masses or tenderness Heart: regular rate and rhythm Abdomen: soft, non-tender; no masses,  no organomegaly  Extremities: extremities normal, atraumatic, no cyanosis or edema Skin: Skin color, texture, turgor normal. No rashes or lesions Lymph nodes: Cervical, supraclavicular, and axillary nodes normal. No abnormal inguinal nodes palpated Neurologic: Grossly normal   Pelvic: External genitalia:  no lesions              Urethra:  normal appearing urethra with no masses, tenderness or lesions              Bartholin's and Skene's: normal                 Vagina: atrophic appearing vagina with pale color and no discharge, no lesions              Cervix: anteverted              Pap taken: yes Bimanual Exam:  Uterus:  normal size, contour, position, consistency, mobility, non-tender              Adnexa: no mass, fullness, tenderness               Rectovaginal: Confirms               Anus:  normal sphincter tone, no  lesions  A:  Well Woman with normal exam  Postmenopausal  Atrophic vaginitis with dyspareunia  P:   Pap smear as per guidelines   Mammogram due 7/15  RX Estrace Vaginal Cream 1 gm intravaginally twice weekly - then can titrate down to 0.5 gm   Counseled with potential risk with ERT including DVT, CVA, cancer, etc  Counseled on breast self exam, mammography screening, use and side effects of HRT, adequate intake of calcium and vitamin D, diet and exercise, Kegel's exercises return annually or prn  An After Visit Summary was printed and given to the patient.

## 2013-12-27 NOTE — Patient Instructions (Signed)

## 2013-12-28 NOTE — Progress Notes (Signed)
Encounter reviewed by Dr. Eagle Pitta Silva.  

## 2013-12-29 LAB — IPS PAP TEST WITH REFLEX TO HPV

## 2014-01-02 ENCOUNTER — Encounter: Payer: Self-pay | Admitting: Internal Medicine

## 2014-01-02 ENCOUNTER — Ambulatory Visit (INDEPENDENT_AMBULATORY_CARE_PROVIDER_SITE_OTHER): Payer: 59 | Admitting: Internal Medicine

## 2014-01-02 VITALS — BP 120/78 | HR 72 | Temp 98.6°F | Resp 16 | Ht 61.25 in | Wt 121.0 lb

## 2014-01-02 DIAGNOSIS — M542 Cervicalgia: Secondary | ICD-10-CM

## 2014-01-02 MED ORDER — GABAPENTIN 100 MG PO CAPS: 100.0000 mg | ORAL_CAPSULE | Freq: Three times a day (TID) | ORAL | Status: DC

## 2014-01-02 NOTE — Progress Notes (Signed)
Subjective:    Patient ID: Cheyenne Myers, female    DOB: 1956-11-08, 58 y.o.   MRN: [id number]  HPI The patient follow for lipids, neuropathy and a hx of MVP Stable Still having issues with sitting and carrying Extended sitting as little as one hour    Review of Systems  Constitutional: Positive for fatigue. Negative for activity change and appetite change.  HENT: Negative for congestion, ear pain, postnasal drip and sinus pressure.   Eyes: Negative for redness and visual disturbance.  Respiratory: Negative for cough, shortness of breath and wheezing.   Gastrointestinal: Negative for abdominal pain and abdominal distention.  Genitourinary: Negative for dysuria, frequency and menstrual problem.  Musculoskeletal: Positive for back pain, myalgias and neck stiffness. Negative for arthralgias, joint swelling and neck pain.  Skin: Negative for rash and wound.  Neurological: Positive for numbness. Negative for dizziness, weakness and headaches.  Hematological: Negative for adenopathy. Does not bruise/bleed easily.  Psychiatric/Behavioral: Negative for sleep disturbance and decreased concentration.   Past Medical History  Diagnosis Date  . Abnormal Pap smear 06/2004    ASCUS, + HPV, CIN II LEEP    History   Social History  . Marital Status: Married    Spouse Name: N/A    Number of Children: N/A  . Years of Education: N/A   Occupational History  . Not on file.   Social History Main Topics  . Smoking status: Never Smoker   . Smokeless tobacco: Not on file  . Alcohol Use: No  . Drug Use: No  . Sexual Activity: Yes   Other Topics Concern  . Not on file   Social History Narrative  . No narrative on file    Past Surgical History  Procedure Laterality Date  . Colposcopy    . Cervical biopsy  w/ loop electrode excision  06/2004    CIN II  . Carpal tunnel release Right 08/2011  . Wisdom tooth extraction  age 86    Family History  Problem Relation Age of Onset  . Heart  disease Mother   . Diabetes Mother   . Alzheimer's disease Mother   . Stroke Sister     tia and pacer  . Atrial fibrillation Sister   . Transient ischemic attack Sister     early alzhemeir    Allergies  Allergen Reactions  . Fluzone [Flu Virus Vaccine] Rash    Pt refuses to continue with flue shots to to localized skin reaction    Current Outpatient Prescriptions on File Prior to Visit  Medication Sig Dispense Refill  . acyclovir (ZOVIRAX) 800 MG tablet Take 800 mg by mouth as needed.        . Cholecalciferol (VITAMIN D) 1000 UNITS capsule Take 1 capsule (1,000 Units total) by mouth 2 (two) times daily.      . Glucosamine 750 MG TABS Take 2 tablets by mouth daily.      Marland Kitchen KRILL OIL 1000 MG CAPS Take 2 capsules (2,000 mg total) by mouth 2 (two) times daily.      Marland Kitchen estradiol (ESTRACE) 0.1 MG/GM vaginal cream Use 1 gm intravaginally twice weekly for 3 months  42.5 g  3  . gabapentin (NEURONTIN) 100 MG capsule Take 1 capsule (100 mg total) by mouth 2 (two) times daily.  60 capsule  2   No current facility-administered medications on file prior to visit.    BP 120/78  Pulse 72  Temp(Src) 98.6 F (37 C)  Resp 16  Ht  5' 1.25" (1.556 m)  Wt 121 lb (54.885 kg)  BMI 22.67 kg/m2        Objective:   Physical Exam  Nursing note and vitals reviewed. Constitutional: She is oriented to person, place, and time. She appears well-developed and well-nourished. No distress.  HENT:  Head: Normocephalic and atraumatic.  Eyes: Conjunctivae and EOM are normal. Pupils are equal, round, and reactive to light.  Neck: Normal range of motion. Neck supple. No JVD present. No tracheal deviation present. No thyromegaly present.  Cardiovascular: Normal rate, regular rhythm and intact distal pulses.   Murmur heard. Pulmonary/Chest: Effort normal and breath sounds normal. She has no wheezes. She exhibits no tenderness.  Abdominal: Soft. Bowel sounds are normal.  Musculoskeletal: Normal range of  motion. She exhibits no edema and no tenderness.  Increased neuropathy  Lymphadenopathy:    She has no cervical adenopathy.  Neurological: She is alert and oriented to person, place, and time. She has normal reflexes. No cranial nerve deficit.  Skin: Skin is warm and dry. She is not diaphoretic.  Psychiatric: She has a normal mood and affect. Her behavior is normal.          Assessment & Plan:  Progressive neuropathy due to radicular changes neurotin helps Work related activity standing at computer would help

## 2014-01-02 NOTE — Progress Notes (Signed)
CINA not available this am 

## 2014-01-02 NOTE — Patient Instructions (Signed)
varidesk comp stand

## 2014-01-05 ENCOUNTER — Encounter: Payer: Self-pay | Admitting: Internal Medicine

## 2014-01-16 ENCOUNTER — Telehealth: Payer: Self-pay | Admitting: *Deleted

## 2014-01-16 ENCOUNTER — Encounter: Payer: Self-pay | Admitting: Internal Medicine

## 2014-01-17 ENCOUNTER — Encounter: Payer: Self-pay | Admitting: *Deleted

## 2014-01-17 NOTE — Telephone Encounter (Signed)
Letter is ready for pick-up.

## 2014-01-19 ENCOUNTER — Encounter: Payer: Self-pay | Admitting: Internal Medicine

## 2014-01-24 ENCOUNTER — Encounter: Payer: Self-pay | Admitting: *Deleted

## 2014-01-24 ENCOUNTER — Other Ambulatory Visit: Payer: Self-pay | Admitting: *Deleted

## 2014-01-25 DIAGNOSIS — Z0279 Encounter for issue of other medical certificate: Secondary | ICD-10-CM

## 2014-01-25 DIAGNOSIS — M47812 Spondylosis without myelopathy or radiculopathy, cervical region: Secondary | ICD-10-CM | POA: Insufficient documentation

## 2014-01-26 ENCOUNTER — Encounter: Payer: Self-pay | Admitting: Internal Medicine

## 2014-05-22 ENCOUNTER — Other Ambulatory Visit: Payer: Self-pay

## 2014-05-22 DIAGNOSIS — Z1231 Encounter for screening mammogram for malignant neoplasm of breast: Secondary | ICD-10-CM

## 2014-06-01 ENCOUNTER — Encounter: Payer: Self-pay | Admitting: Internal Medicine

## 2014-06-12 ENCOUNTER — Other Ambulatory Visit (INDEPENDENT_AMBULATORY_CARE_PROVIDER_SITE_OTHER): Payer: 59

## 2014-06-12 DIAGNOSIS — Z0000 Encounter for general adult medical examination without abnormal findings: Secondary | ICD-10-CM

## 2014-06-12 LAB — POCT URINALYSIS DIPSTICK
Bilirubin, UA: NEGATIVE
Blood, UA: NEGATIVE
Glucose, UA: NEGATIVE
Ketones, UA: NEGATIVE
Leukocytes, UA: NEGATIVE
Nitrite, UA: NEGATIVE
PH UA: 7
Protein, UA: NEGATIVE
SPEC GRAV UA: 1.01
Urobilinogen, UA: 0.2

## 2014-06-12 LAB — BASIC METABOLIC PANEL
BUN: 13 mg/dL (ref 6–23)
CHLORIDE: 104 meq/L (ref 96–112)
CO2: 31 meq/L (ref 19–32)
Calcium: 9.2 mg/dL (ref 8.4–10.5)
Creatinine, Ser: 0.8 mg/dL (ref 0.4–1.2)
GFR: 83.15 mL/min (ref >60.00)
Glucose, Bld: 86 mg/dL (ref 70–99)
Potassium: 4.1 mEq/L (ref 3.5–5.1)
Sodium: 141 mEq/L (ref 135–145)

## 2014-06-12 LAB — CBC WITH DIFFERENTIAL/PLATELET
BASOS PCT: 0.4 % (ref 0.0–3.0)
Basophils Absolute: 0 10*3/uL (ref 0.0–0.1)
EOS PCT: 1.4 % (ref 0.0–5.0)
Eosinophils Absolute: 0.1 10*3/uL (ref 0.0–0.7)
HCT: 40.5 % (ref 36.0–46.0)
HEMOGLOBIN: 13.6 g/dL (ref 12.0–15.0)
LYMPHS PCT: 25.2 % (ref 12.0–46.0)
Lymphs Abs: 1.3 10*3/uL (ref 0.7–4.0)
MCHC: 33.7 g/dL (ref 30.0–36.0)
MCV: 91.4 fl (ref 78.0–100.0)
MONOS PCT: 7.5 % (ref 3.0–12.0)
Monocytes Absolute: 0.4 10*3/uL (ref 0.1–1.0)
NEUTROS ABS: 3.4 10*3/uL (ref 1.4–7.7)
Neutrophils Relative %: 65.5 % (ref 43.0–77.0)
Platelets: 224 10*3/uL (ref 150.0–400.0)
RBC: 4.43 Mil/uL (ref 3.87–5.11)
RDW: 12.8 % (ref 11.5–15.5)
WBC: 5.2 10*3/uL (ref 4.0–10.5)

## 2014-06-12 LAB — HEPATIC FUNCTION PANEL
ALBUMIN: 4.3 g/dL (ref 3.5–5.2)
ALK PHOS: 22 U/L — AB (ref 39–117)
ALT: 19 U/L (ref 0–35)
AST: 23 U/L (ref 0–37)
Bilirubin, Direct: 0.1 mg/dL (ref 0.0–0.3)
TOTAL PROTEIN: 6.9 g/dL (ref 6.0–8.3)
Total Bilirubin: 0.9 mg/dL (ref 0.2–1.2)

## 2014-06-12 LAB — LIPID PANEL
CHOLESTEROL: 216 mg/dL — AB (ref 0–200)
HDL: 68.9 mg/dL (ref >39.00)
LDL CALC: 137 mg/dL — AB (ref 0–99)
NonHDL: 147.1
TRIGLYCERIDES: 49 mg/dL (ref 0.0–149.0)
Total CHOL/HDL Ratio: 3
VLDL: 9.8 mg/dL (ref 0.0–40.0)

## 2014-06-12 LAB — TSH: TSH: 0.97 u[IU]/mL (ref 0.35–4.50)

## 2014-06-19 ENCOUNTER — Encounter: Payer: Self-pay | Admitting: Internal Medicine

## 2014-06-27 ENCOUNTER — Ambulatory Visit
Admission: RE | Admit: 2014-06-27 | Discharge: 2014-06-27 | Disposition: A | Discharge status: Home / Self Care | Payer: 59 | Source: Ambulatory Visit

## 2014-06-27 DIAGNOSIS — Z1231 Encounter for screening mammogram for malignant neoplasm of breast: Secondary | ICD-10-CM

## 2014-06-28 ENCOUNTER — Telehealth: Payer: Self-pay | Admitting: Nurse Practitioner

## 2014-06-28 NOTE — Telephone Encounter (Signed)
Patient calling to speak with nurse about "pelvic floor pain and pressure with some bloating that has been ongoing for awhile."

## 2014-06-28 NOTE — Telephone Encounter (Signed)
Spoke with patient. Patient states that she has been experiencing pressure in her "pelvic floor" and bloating. "This is not something that is new for me it has been going on for a while. It has just started to be more noticeable so I did not know if I needed to see someone or not." Patient is having urinary frequency but states "That is my normal. I always feel like I need to use the bathroom." Denies fevers, back pain, and urinary burning. Advised patient it is best that she come in to see Milford Cage, [place] for evaluation. Patient agreeable. Offered appointment tomorrow but patient declines. Requesting appointment for Friday. Appointment scheduled for Friday 7/10 at 11am with Milford Cage, [place]. Patient agreeable to date and time.  Routing to provider for final review. Patient agreeable to disposition. Will close encounter

## 2014-06-30 ENCOUNTER — Ambulatory Visit: Payer: 59 | Admitting: Nurse Practitioner

## 2014-07-04 ENCOUNTER — Other Ambulatory Visit: Payer: Self-pay | Admitting: Internal Medicine

## 2014-07-05 ENCOUNTER — Telehealth: Payer: Self-pay | Admitting: Internal Medicine

## 2014-07-05 NOTE — Telephone Encounter (Signed)
Pharm needs instructions on acyclovir (ZOVIRAX) 800 MG tablet How often? pls call.

## 2014-07-05 NOTE — Telephone Encounter (Signed)
Please advise on directions? 

## 2014-07-07 ENCOUNTER — Other Ambulatory Visit: Payer: Self-pay

## 2014-07-07 MED ORDER — ACYCLOVIR 800 MG PO TABS: ORAL_TABLET | ORAL | Status: DC

## 2014-07-07 NOTE — Telephone Encounter (Signed)
Tid for 5 days prn

## 2014-07-07 NOTE — Telephone Encounter (Signed)
Rx resent to pharmacy with instructions.

## 2014-07-18 ENCOUNTER — Ambulatory Visit (INDEPENDENT_AMBULATORY_CARE_PROVIDER_SITE_OTHER): Payer: 59 | Admitting: Family Medicine

## 2014-07-18 ENCOUNTER — Encounter: Payer: Self-pay | Admitting: Family Medicine

## 2014-07-18 VITALS — BP 112/80 | HR 62 | Temp 98.0°F | Ht 61.25 in | Wt 125.0 lb

## 2014-07-18 DIAGNOSIS — M4802 Spinal stenosis, cervical region: Secondary | ICD-10-CM | POA: Insufficient documentation

## 2014-07-18 DIAGNOSIS — Z8619 Personal history of other infectious and parasitic diseases: Secondary | ICD-10-CM

## 2014-07-18 DIAGNOSIS — Z7689 Persons encountering health services in other specified circumstances: Secondary | ICD-10-CM

## 2014-07-18 DIAGNOSIS — Z7189 Other specified counseling: Secondary | ICD-10-CM

## 2014-07-18 DIAGNOSIS — M5412 Radiculopathy, cervical region: Secondary | ICD-10-CM

## 2014-07-18 NOTE — Progress Notes (Signed)
Pre visit review using our clinic review tool, if applicable. No additional management support is needed unless otherwise documented below in the visit note. 

## 2014-07-18 NOTE — Progress Notes (Signed)
No chief complaint on file.   HPI:  Cheyenne Myers is here to establish care.  Last PCP and physical: sees gyn for pap and mammo utd, UTD on colon; had labs with PCP recently and good.  Has the following chronic problems and concerns today:  Patient Active Problem List   Diagnosis Date Noted  . Cervical radiculopathy - managed by neurosurgeon 07/18/2014  . H/O cold sores 07/18/2014  . HYPERLIPIDEMIA 08/05/2007   Bump on head: -noticed probably a few months ago -sees dermatologist and told cyst and told they could remove this -no pain or swelling  Oral herpes - cold sores: -uses acyclovir pen -no cold sores now  Mild dyslipidemia: -takes krill oil -walks on regular basis and healthy diet  OA: -knees -stable -denies: weakness or falls  ROS negative for unless reported above: fevers, unintentional weight loss, hearing or vision loss, chest pain, palpitations, struggling to breath, hemoptysis, melena, hematochezia, hematuria, falls, loc, si, thoughts of self harm  Past Medical History  Diagnosis Date  . Abnormal Pap smear 06/2004    ASCUS, + HPV, CIN II LEEP  . MITRAL VALVE PROLAPSE 08/05/2007    Qualifier: Diagnosis of  By: Tiney Rouge CMA, Ellison Hughs    . URINARY TRACT INFECTION, RECURRENT 12/06/2007    Qualifier: Diagnosis of  By: Arnoldo Morale MD, [place] SYNDROME, BILATERAL 10/28/2010    Qualifier: Diagnosis of  By: Arnoldo Morale MD, Balinda Quails     Family History  Problem Relation Age of Onset  . Heart disease Mother   . Diabetes Mother   . Alzheimer's disease Mother   . Stroke Sister     tia and pacer  . Atrial fibrillation Sister   . Transient ischemic attack Sister     early alzhemeir    History   Social History  . Marital Status: Married    Spouse Name: N/A    Number of Children: N/A  . Years of Education: N/A   Social History Main Topics  . Smoking status: Never Smoker   . Smokeless tobacco: None  . Alcohol Use: No  . Drug Use: No  . Sexual Activity:  Yes   Other Topics Concern  . 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7 C)    Body mass index is 23.42 kg/(m^2).  GENERAL: vitals reviewed and listed above, alert, oriented, appears well hydrated and in no acute distress  HEENT: atraumatic, conjunttiva clear, no obvious abnormalities on inspection of external nose and ears  NECK: no obvious masses on inspection  LUNGS: clear to auscultation bilaterally, no wheezes, rales or rhonchi, good air movement  CV: HRRR, no  peripheral edema, I/VI sem  MS: moves all extremities without noticeable abnormality  PSYCH: pleasant and cooperative, no obvious depression or anxiety  ASSESSMENT AND PLAN:  Discussed the following assessment and plan:  Encounter to establish care with new doctor  Cervical radiculopathy - managed by neurosurgeon  H/O cold sores  30 minutes, greater then half spent face to face with patient discussing the following:  -We reviewed the PMH, PSH, FH, SH, Meds and Allergies. -We provided refills for any medications we will prescribe as needed. -We addressed current concerns per orders and patient instructions. -We have asked for records for pertinent exams, studies, vaccines and notes from previous providers. -We have advised patient to follow  up per instructions below. -follow up as needed and yearly for physical -offered dexa as can not find her old dex - sh will hold off -discussed her heart murmur, asymptomatic, benign sounding, she can't remember if had echo, offered echo as she seem worried about eval of this but then she declined and will let us know if wants to do this  -Patient advised to return or notify a doctor immediately if symptoms worsen or persist or new concerns arise.  Patient Instructions  -Vit De 1000 IU daily; calcium 1200mg  from diet and + or - supplement  We recommend the following healthy lifestyle measures: - eat a healthy diet consisting of lots of vegetables, fruits, beans, nuts, seeds, healthy meats such as white chicken and fish and whole grains.  - avoid fried foods, fast food, processed foods, sodas, red meet and other fattening foods.  - get a least 150 minutes of aerobic exercise per week.   Follow up in: 1 year or as needed      Sari Cogan, Jarrett Soho R.

## 2014-07-18 NOTE — Patient Instructions (Addendum)
-  Vit De 1000 IU daily; calcium 1200mg  from diet and + or - supplement  We recommend the following healthy lifestyle measures: - eat a healthy diet consisting of lots of vegetables, fruits, beans, nuts, seeds, healthy meats such as white chicken and fish and whole grains.  - avoid fried foods, fast food, processed foods, sodas, red meet and other fattening foods.  - get a least 150 minutes of aerobic exercise per week.   Follow up in: 1 year or as needed

## 2014-10-02 ENCOUNTER — Other Ambulatory Visit: Payer: Self-pay | Admitting: Dermatology

## 2014-10-23 ENCOUNTER — Encounter: Payer: Self-pay | Admitting: Family Medicine

## 2014-12-28 ENCOUNTER — Ambulatory Visit: Payer: 59 | Admitting: Nurse Practitioner

## 2015-01-09 ENCOUNTER — Ambulatory Visit (INDEPENDENT_AMBULATORY_CARE_PROVIDER_SITE_OTHER): Payer: 59 | Admitting: Nurse Practitioner

## 2015-01-09 ENCOUNTER — Encounter: Payer: Self-pay | Admitting: Nurse Practitioner

## 2015-01-09 VITALS — BP 128/84 | HR 68 | Ht 61.25 in | Wt 124.0 lb

## 2015-01-09 DIAGNOSIS — Z13820 Encounter for screening for osteoporosis: Secondary | ICD-10-CM | POA: Diagnosis not present

## 2015-01-09 DIAGNOSIS — Z01419 Encounter for gynecological examination (general) (routine) without abnormal findings: Secondary | ICD-10-CM

## 2015-01-09 DIAGNOSIS — Z0000 Encounter for general adult medical examination without abnormal findings: Secondary | ICD-10-CM | POA: Diagnosis not present

## 2015-01-09 DIAGNOSIS — E2839 Other primary ovarian failure: Secondary | ICD-10-CM

## 2015-01-09 LAB — POCT URINALYSIS DIPSTICK
Bilirubin, UA: NEGATIVE
Blood, UA: NEGATIVE
Glucose, UA: NEGATIVE
Ketones, UA: NEGATIVE
Leukocytes, UA: NEGATIVE
Nitrite, UA: NEGATIVE
Protein, UA: NEGATIVE
Urobilinogen, UA: NEGATIVE
pH, UA: 5

## 2015-01-09 NOTE — Patient Instructions (Signed)

## 2015-01-09 NOTE — Progress Notes (Signed)
Patient ID: Cheyenne Myers, female   DOB: [birth year], 59 y.o.   MRN: [id number] 59 y.o. G2P2002 Married  Caucasian Fe here for annual exam. Still with some vaginal dryness using OTC EVOO.  No new health problems.   Patient's last menstrual period was 10/17/2009.          Sexually active: Yes.    The current method of family planning is post menopausal status.    Exercising: Yes.    Home exercise routine includes walking everyday weather permits. Smoker:  no  Health Maintenance: Pap:  12/27/13, negative, history of LEEP for CIN II in 06/2004 MMG:  06/27/14, Bi-Rads 1:  Negative  Colonoscopy:  10/2005, repeat in 10 years BMD:   01/17/10, normal, [employer] TDaP:  11/21/06 Labs: HB:  PCP  Urine:  Negative    reports that she has never smoked. She does not have any smokeless tobacco history on file. She reports that she does not drink alcohol or use illicit drugs.  Past Medical History  Diagnosis Date  . Abnormal Pap smear 06/2004    ASCUS, + HPV, CIN II LEEP  . MITRAL VALVE PROLAPSE 08/05/2007    Qualifier: Diagnosis of  By: Tiney Rouge CMA, Ellison Hughs    . URINARY TRACT INFECTION, RECURRENT 12/06/2007    Qualifier: Diagnosis of  By: Arnoldo Morale MD, [place] SYNDROME, BILATERAL 10/28/2010    Qualifier: Diagnosis of  By: Arnoldo Morale MD, Balinda Quails     Past Surgical History  Procedure Laterality Date  . Colposcopy    . Cervical biopsy  w/ loop electrode excision  06/2004    CIN II  . Carpal tunnel release Right 08/2011  . Wisdom tooth extraction  age 56    Current Outpatient Prescriptions  Medication Sig Dispense Refill  . acyclovir (ZOVIRAX) 800 MG tablet TID X 5 DAYS PRN 28 tablet 11  . Cholecalciferol (VITAMIN D) 1000 UNITS capsule Take 1 capsule (1,000 Units total) by mouth 2 (two) times daily.    . Glucosamine 750 MG TABS Take 2 tablets by mouth daily.    Marland Kitchen KRILL OIL 1000 MG CAPS Take 2 capsules (2,000 mg total) by mouth 2 (two) times daily.     No current facility-administered medications for  this visit.    Family History  Problem Relation Age of Onset  . Heart disease Mother   . Diabetes Mother   . Alzheimer's disease Mother   . Stroke Sister     tia and pacer  . Atrial fibrillation Sister   . Transient ischemic attack Sister     early alzhemeir    ROS:  Pertinent items are noted in HPI.  Otherwise, a comprehensive ROS was negative.  Exam:   BP 128/84 mmHg  Pulse 68  Ht 5' 1.25" (1.556 m)  Wt 124 lb (56.246 kg)  BMI 23.23 kg/m2  LMP 10/17/2009 Height: 5' 1.25" (155.6 cm) Ht Readings from Last 3 Encounters:  01/09/15 5' 1.25" (1.556 m)  07/18/14 5' 1.25" (1.556 m)  01/02/14 5' 1.25" (1.556 m)    General appearance: alert, cooperative and appears stated age Head: Normocephalic, without obvious abnormality, atraumatic Neck: no adenopathy, supple, symmetrical, trachea midline and thyroid normal to inspection and palpation Lungs: clear to auscultation bilaterally Breasts: normal appearance, no masses or tenderness Heart: regular rate and rhythm Abdomen: soft, non-tender; no masses,  no organomegaly Extremities: extremities normal, atraumatic, no cyanosis or edema Skin: Skin color, texture, turgor normal. No rashes or lesions Lymph nodes: Cervical,  supraclavicular, and axillary nodes normal. No abnormal inguinal nodes palpated Neurologic: Grossly normal   Pelvic: External genitalia:  no lesions              Urethra:  normal appearing urethra with no masses, tenderness or lesions              Bartholin's and Skene's: normal                 Vagina: normal appearing vagina with normal color and discharge, no lesions              Cervix: anteverted              Pap taken: Yes.   Bimanual Exam:  Uterus:  normal size, contour, position, consistency, mobility, non-tender              Adnexa: no mass, fullness, tenderness               Rectovaginal: Confirms               Anus:  normal sphincter tone, no lesions  Chaperone present: No  A:  Well Woman with  normal exam  Postmenopausal no HRT Atrophic vaginitis with dyspareunia using OTC lubrication  History of CIN II with LEEP 2005  P:   Reviewed health and wellness pertinent to exam  Pap smear taken today  Mammogram is due 06/2015  colonoscopy is due 10/2015 - she may decide to get at [place] for BMD to [place] on breast self exam, mammography screening, adequate intake of calcium and vitamin D, diet and exercise return annually or prn  An After Visit Summary was printed and given to the patient.

## 2015-01-09 NOTE — Progress Notes (Signed)
Encounter reviewed by Dr. Ondria Oswald Silva.  

## 2015-01-11 NOTE — Addendum Note (Signed)
Addended by: Antonietta Barcelona on: 01/11/2015 05:19 PM   Modules accepted: Orders

## 2015-01-17 ENCOUNTER — Ambulatory Visit (INDEPENDENT_AMBULATORY_CARE_PROVIDER_SITE_OTHER)
Admission: RE | Admit: 2015-01-17 | Discharge: 2015-01-17 | Disposition: A | Discharge status: Home / Self Care | Payer: 59 | Source: Ambulatory Visit | Attending: Nurse Practitioner | Admitting: Nurse Practitioner

## 2015-01-17 DIAGNOSIS — Z13820 Encounter for screening for osteoporosis: Secondary | ICD-10-CM

## 2015-01-17 DIAGNOSIS — E2839 Other primary ovarian failure: Secondary | ICD-10-CM

## 2015-01-17 LAB — IPS PAP TEST WITH HPV

## 2015-02-01 ENCOUNTER — Ambulatory Visit (INDEPENDENT_AMBULATORY_CARE_PROVIDER_SITE_OTHER): Payer: 59 | Admitting: Family Medicine

## 2015-02-01 ENCOUNTER — Encounter: Payer: Self-pay | Admitting: Family Medicine

## 2015-02-01 VITALS — BP 124/82 | HR 65 | Temp 97.8°F | Ht 61.0 in | Wt 125.1 lb

## 2015-02-01 DIAGNOSIS — Z0000 Encounter for general adult medical examination without abnormal findings: Secondary | ICD-10-CM

## 2015-02-01 LAB — LIPID PANEL
CHOL/HDL RATIO: 3
Cholesterol: 213 mg/dL — ABNORMAL HIGH (ref 0–200)
HDL: 68.1 mg/dL (ref >39.00)
LDL Cholesterol: 122 mg/dL — ABNORMAL HIGH (ref 0–99)
NonHDL: 144.9
Triglycerides: 115 mg/dL (ref 0.0–149.0)
VLDL: 23 mg/dL (ref 0.0–40.0)

## 2015-02-01 NOTE — Progress Notes (Signed)
HPI:  Here for CPE:  -Concerns and/or follow up today:   Oral herpes - cold sores: -uses acyclovir prn -no cold sores now  Mild dyslipidemia: -takes krill oil -walks on regular basis and healthy diet  OA: -knees -stable -denies: weakness or falls  -Diet: variety of foods, balance and well rounded, larger portion sizes  -Exercise: regular exercise - walks when weather permits  -Taking folic acid, vitamin D or calcium: takes vitamin D  intermittently  -Diabetes and Dyslipidemia Screening:  FASTING  -Hx of HTN: no  -Vaccines: flu UTD  -pap history: sees gyn  -FDLMP: n/a  -sexual activity: yes, female partner, no new partners  -wants STI testing: no  -FH breast, colon or ovarian ca: see FH Last mammogram: UTD Last colon cancer screening: UTD  Sees gyn for   -Alcohol, Tobacco, drug use: see social history  Review of Systems - no fevers, unintentional weight loss, vision loss, hearing loss, chest pain, sob, hemoptysis, melena, hematochezia, hematuria, genital discharge, changing or concerning skin lesions, bleeding, bruising, loc, thoughts of self harm or SI  Past Medical History  Diagnosis Date  . Abnormal Pap smear 06/2004    ASCUS, + HPV, CIN II LEEP  . MITRAL VALVE PROLAPSE 08/05/2007    Qualifier: Diagnosis of  By: Tiney Rouge CMA, Ellison Hughs    . URINARY TRACT INFECTION, RECURRENT 12/06/2007    Qualifier: Diagnosis of  By: Arnoldo Morale MD, [place] SYNDROME, BILATERAL 10/28/2010    Qualifier: Diagnosis of  By: Arnoldo Morale MD, Balinda Quails     Past Surgical History  Procedure Laterality Date  . Colposcopy    . Cervical biopsy  w/ loop electrode excision  06/2004    CIN II  . Carpal tunnel release Right 08/2011  . Wisdom tooth extraction  age 71    Family History  Problem Relation Age of Onset  . Heart disease Mother   . Diabetes Mother   . Alzheimer's disease Mother   . Stroke Sister     tia and pacer  . Atrial fibrillation Sister   . Transient  ischemic attack Sister     early alzhemeir    History   Social History  . Marital Status: Married    Spouse Name: N/A  . Number of Children: N/A  . Years of Education: N/A   Social History Main Topics  . Smoking status: Never Smoker   . Smokeless tobacco: Not on file  . Alcohol Use: No  . Drug Use: No  . Sexual Activity: Yes   Other Topics Concern  . 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  acyclovir (ZOVIRAX) 800 MG tablet, TID X 5 DAYS PRN, Disp: 28 tablet, Rfl: 11 .  Cholecalciferol (VITAMIN D) 1000 UNITS capsule, Take 1 capsule (1,000 Units total) by mouth 2 (two) times daily., Disp: , Rfl:  .  Glucosamine 750 MG TABS, Take 2 tablets by mouth daily., Disp: , Rfl:  .  KRILL OIL 1000 MG CAPS, Take 2 capsules (2,000 mg total) by mouth 2 (two) times daily., Disp: , Rfl:   EXAM:  Filed Vitals:   02/01/15 0823  BP: 124/82  Pulse: 65  Temp: 97.8 F (36.6 C)    GENERAL: vitals reviewed and listed below, alert, oriented, appears well hydrated and in no acute distress  HEENT: head atraumatic, PERRLA, normal appearance of eyes, ears, nose and mouth. moist mucus membranes.  NECK: supple, no masses or lymphadenopathy  LUNGS: clear to auscultation bilaterally, no rales, rhonchi or wheeze  CV: HRRR, no peripheral edema or cyanosis, normal pedal pulses  BREAST: declined  ABDOMEN: bowel sounds normal, soft, non tender to palpation, no masses, no rebound or guarding  GU: declined  RECTAL: refused  SKIN: no rash or abnormal lesions  MS: normal gait, moves all extremities normally  NEURO: CN II-XII grossly intact, normal muscle strength and sensation to light touch on extremities  PSYCH: normal affect, pleasant and cooperative  ASSESSMENT AND  PLAN:  Discussed the following assessment and plan:  Visit for preventive health examination - Plan: Lipid Panel  -Discussed and advised all [place] preventive services health task force level A and B recommendations for age, sex and risks.  -Advised at least 150 minutes of exercise per week and a healthy diet low in saturated fats and sweets and consisting of fresh fruits and vegetables, lean meats such as fish and white chicken and whole grains.  -FASTING labs, studies and vaccines per orders this encounter  Orders Placed This Encounter  Procedures  . Lipid Panel    Patient advised to return to clinic immediately if symptoms worsen or persist or new concerns.  Patient Instructions  -We have ordered labs or studies at this visit. It can take up to 1-2 weeks for results and processing. We will contact you with instructions IF your results are abnormal. Normal results will be released to your [facility]. If you have not heard from [place] or can not find your results in [facility] in 2 weeks please contact our office.  -vit D [phone] IU daily  We recommend the following healthy lifestyle measures: - eat a healthy diet consisting of lots of vegetables, fruits, beans, nuts, seeds, healthy meats such as white chicken and fish and whole grains.  - avoid fried foods, fast food, processed foods, sodas, red meet and other fattening foods.  - get a least 150 minutes of aerobic exercise per week.       No Follow-up on file.  Colin Benton R.

## 2015-02-01 NOTE — Patient Instructions (Signed)
-  We have ordered labs or studies at this visit. It can take up to 1-2 weeks for results and processing. We will contact you with instructions IF your results are abnormal. Normal results will be released to your [facility]. If you have not heard from [place] or can not find your results in [facility] in 2 weeks please contact our office.  -vit D [phone] IU daily  We recommend the following healthy lifestyle measures: - eat a healthy diet consisting of lots of vegetables, fruits, beans, nuts, seeds, healthy meats such as white chicken and fish and whole grains.  - avoid fried foods, fast food, processed foods, sodas, red meet and other fattening foods.  - get a least 150 minutes of aerobic exercise per week.

## 2015-02-01 NOTE — Progress Notes (Signed)
Pre visit review using our clinic review tool, if applicable. No additional management support is needed unless otherwise documented below in the visit note. 

## 2015-02-14 ENCOUNTER — Ambulatory Visit (INDEPENDENT_AMBULATORY_CARE_PROVIDER_SITE_OTHER): Payer: 59 | Admitting: Family Medicine

## 2015-02-14 ENCOUNTER — Encounter: Payer: Self-pay | Admitting: Family Medicine

## 2015-02-14 VITALS — BP 118/64 | HR 68 | Temp 98.2°F | Ht 61.0 in | Wt 129.0 lb

## 2015-02-14 DIAGNOSIS — H00019 Hordeolum externum unspecified eye, unspecified eyelid: Secondary | ICD-10-CM

## 2015-02-14 NOTE — Progress Notes (Signed)
Pre visit review using our clinic review tool, if applicable. No additional management support is needed unless otherwise documented below in the visit note. 

## 2015-02-14 NOTE — Patient Instructions (Signed)

## 2015-02-14 NOTE — Progress Notes (Signed)
HPI:  Sty R lower eyelid: -a little itchy -started 1 week ago -did some eye drops - antibacterial -no eye pain or vision changes or drainage -reports has appointment with her eye doctor tomorrow  ROS: See pertinent positives and negatives per HPI.  Past Medical History  Diagnosis Date  . Abnormal Pap smear 06/2004    ASCUS, + HPV, CIN II LEEP  . MITRAL VALVE PROLAPSE 08/05/2007    Qualifier: Diagnosis of  By: Tiney Rouge CMA, Ellison Hughs    . URINARY TRACT INFECTION, RECURRENT 12/06/2007    Qualifier: Diagnosis of  By: Arnoldo Morale MD, [place] SYNDROME, BILATERAL 10/28/2010    Qualifier: Diagnosis of  By: Arnoldo Morale MD, Balinda Quails     Past Surgical History  Procedure Laterality Date  . Colposcopy    . Cervical biopsy  w/ loop electrode excision  06/2004    CIN II  . Carpal tunnel release Right 08/2011  . Wisdom tooth extraction  age 26    Family History  Problem Relation Age of Onset  . Heart disease Mother   . Diabetes Mother   . Alzheimer's disease Mother   . Stroke Sister     tia and pacer  . Atrial fibrillation Sister   . Transient ischemic attack Sister     early alzhemeir    History   Social History  . Marital Status: Married    Spouse Name: N/A  . Number of Children: N/A  . Years of Education: N/A   Social History Main Topics  . Smoking status: Never Smoker   . Smokeless tobacco: Not on file  . Alcohol Use: No  . Drug Use: No  . Sexual Activity: Yes   Other Topics Concern  . 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  acyclovir (ZOVIRAX) 800 MG tablet, TID X 5 DAYS PRN, Disp: 28 tablet, Rfl: 11 .  Cholecalciferol (VITAMIN D) 1000 UNITS capsule, Take 1 capsule (1,000 Units total) by mouth 2 (two) times daily., Disp: , Rfl:  .   Glucosamine 750 MG TABS, Take 2 tablets by mouth daily., Disp: , Rfl:  .  KRILL OIL 1000 MG CAPS, Take 2 capsules (2,000 mg total) by mouth 2 (two) times daily., Disp: , Rfl:   EXAM:  Filed Vitals:   02/14/15 1119  BP: 118/64  Pulse: 68  Temp: 98.2 F (36.8 C)    Body mass index is 24.39 kg/(m^2).  GENERAL: vitals reviewed and listed above, alert, oriented, appears well hydrated and in no acute distress  HEENT: atraumatic, conjunttiva clear, visual acuity grossly intact, small sty R lower eyelid,no obvious abnormalities on inspection of external nose and ears  NECK: no obvious masses on inspection  MS: moves all extremities without noticeable abnormality  PSYCH: pleasant and cooperative, no obvious depression or anxiety  ASSESSMENT AND PLAN:  Discussed the following assessment and plan:  Sty, external, unspecified laterality  -compresses, stop eye drops -she is scheduled to see her eye doctor next week and can f/u on this then -Patient advised to return or notify a doctor immediately if symptoms worsen or persist or new concerns arise.  Patient Instructions  Sty A sty (  hordeolum) is an infection of a gland in the eyelid located at the base of the eyelash. A sty may develop a white or yellow head of pus. It can be puffy (swollen). Usually, the sty will burst and pus will come out on its own. They do not leave lumps in the eyelid once they drain. A sty is often confused with another form of cyst of the eyelid called a chalazion. Chalazions occur within the eyelid and not on the edge where the bases of the eyelashes are. They often are red, sore and then form firm lumps in the eyelid. CAUSES   Germs (bacteria).  Lasting (chronic) eyelid inflammation. SYMPTOMS   Tenderness, redness and swelling along the edge of the eyelid at the base of the eyelashes.  Sometimes, there is a white or yellow head of pus. It may or may not drain. DIAGNOSIS  An ophthalmologist will be able  to distinguish between a sty and a chalazion and treat the condition appropriately.  TREATMENT   Styes are typically treated with warm packs (compresses) until drainage occurs.  In rare cases, medicines that kill germs (antibiotics) may be prescribed. These antibiotics may be in the form of drops, cream or pills.  If a hard lump has formed, it is generally necessary to do a small incision and remove the hardened contents of the cyst in a minor surgical procedure done in the office.  In suspicious cases, your caregiver may send the contents of the cyst to the lab to be certain that it is not a rare, but dangerous form of cancer of the glands of the eyelid. HOME CARE INSTRUCTIONS   Wash your hands often and dry them with a clean towel. Avoid touching your eyelid. This may spread the infection to other parts of the eye.  Apply heat to your eyelid for 10 to 20 minutes, several times a day, to ease pain and help to heal it faster.  Do not squeeze the sty. Allow it to drain on its own. Wash your eyelid carefully 3 to 4 times per day to remove any pus. SEEK IMMEDIATE MEDICAL CARE IF:   Your eye becomes painful or puffy (swollen).  Your vision changes.  Your sty does not drain by itself within 3 days.  Your sty comes back within a short period of time, even with treatment.  You have redness (inflammation) around the eye.  You have a fever. Document Released: 09/17/2005 Document Revised: 03/01/2012 Document Reviewed: 03/24/2014 [facility] Patient Information 2015 [place], [place]. This information is not intended to replace advice given to you by your health care provider. Make sure you discuss any questions you have with your health care provider.      Colin Benton R.

## 2015-03-19 ENCOUNTER — Telehealth: Payer: Self-pay | Admitting: Nurse Practitioner

## 2015-03-19 NOTE — Telephone Encounter (Signed)
Made in error

## 2015-04-19 ENCOUNTER — Encounter: Payer: Self-pay | Admitting: Family Medicine

## 2015-04-19 ENCOUNTER — Ambulatory Visit (INDEPENDENT_AMBULATORY_CARE_PROVIDER_SITE_OTHER): Payer: 59 | Admitting: Family Medicine

## 2015-04-19 VITALS — BP 116/78 | HR 70 | Temp 98.8°F | Ht 61.0 in | Wt 126.9 lb

## 2015-04-19 DIAGNOSIS — Z8619 Personal history of other infectious and parasitic diseases: Secondary | ICD-10-CM

## 2015-04-19 NOTE — Progress Notes (Signed)
Pre visit review using our clinic review tool, if applicable. No additional management support is needed unless otherwise documented below in the visit note. 

## 2015-04-19 NOTE — Progress Notes (Signed)
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  Past Medical History  Diagnosis Date  . Abnormal Pap smear 06/2004    ASCUS, + HPV, CIN II LEEP  . MITRAL VALVE PROLAPSE 08/05/2007    Qualifier: Diagnosis of  By: Tiney Rouge CMA, Ellison Hughs    . URINARY TRACT INFECTION, RECURRENT 12/06/2007    Qualifier: Diagnosis of  By: Arnoldo Morale MD, [place] SYNDROME, BILATERAL 10/28/2010    Qualifier: Diagnosis of  By: Arnoldo Morale MD, Balinda Quails     Past Surgical History  Procedure Laterality Date  . Colposcopy    . Cervical biopsy  w/ loop electrode excision  06/2004    CIN II  . Carpal tunnel release Right 08/2011  . Wisdom tooth extraction  age 34    Family History  Problem Relation Age of Onset  . Heart disease Mother   . Diabetes Mother   . Alzheimer's disease Mother   . Stroke Sister     tia and pacer  . Atrial fibrillation Sister   . Transient ischemic attack Sister     early alzhemeir    History   Social History  . Marital Status: Married    Spouse Name: N/A  . Number of Children: N/A  . Years of Education: N/A   Social History Main Topics  . Smoking status: Never Smoker   . Smokeless tobacco: Not on file  . Alcohol Use: No  . Drug Use: No  . Sexual Activity: Yes   Other Topics Concern  . 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  acyclovir (ZOVIRAX) 800 MG tablet, TID X 5 DAYS PRN, Disp: 28 tablet, Rfl: 11 .  Cholecalciferol (VITAMIN D) 1000  UNITS capsule, Take 1 capsule (1,000 Units total) by mouth 2 (two) times daily., Disp: , Rfl:  .  Glucosamine 750 MG TABS, Take 2 tablets by mouth daily., Disp: , Rfl:  .  KRILL OIL 1000 MG CAPS, Take 2 capsules (2,000 mg total) by mouth 2 (two) times daily., Disp: , Rfl:   EXAM:  Filed Vitals:   04/19/15 1330  BP: 116/78  Pulse: 70  Temp: 98.8 F (37.1 C)    Body mass index is 23.99 kg/(m^2).  GENERAL: vitals reviewed and listed above, alert, oriented, appears well hydrated and in no acute distress  HEENT: atraumatic, conjunttiva clear, no obvious abnormalities on inspection of external nose and ears  NECK: no obvious masses on inspection  SKIN: no ob ious lesions of face or lips  MS: moves all extremities without noticeable abnormality  PSYCH: pleasant and cooperative, no obvious depression or anxiety  ASSESSMENT AND PLAN:  Discussed the following assessment and plan:  H/O cold sores  -discussed pros/cons and reasons for suppressive therapy for cold sores and she opted not to do this -she is very anxious about getting dementia and advise healthy lifestyle,  stress management and offered referal to neurologist if she wanted to discuss the hereditary links with dementia further, she opted to hold on this for now -Patient advised to return or notify a doctor immediately if symptoms worsen or persist or new concerns arise.  There are no Patient Instructions on file for this visit.   Colin Benton R.

## 2015-06-08 ENCOUNTER — Other Ambulatory Visit: Payer: Self-pay

## 2015-06-08 DIAGNOSIS — Z1231 Encounter for screening mammogram for malignant neoplasm of breast: Secondary | ICD-10-CM

## 2015-06-12 ENCOUNTER — Encounter: Payer: Self-pay | Admitting: Family Medicine

## 2015-06-12 ENCOUNTER — Ambulatory Visit (INDEPENDENT_AMBULATORY_CARE_PROVIDER_SITE_OTHER): Payer: 59 | Admitting: Family Medicine

## 2015-06-12 VITALS — BP 116/80 | HR 66 | Temp 98.3°F | Ht 61.0 in | Wt 129.2 lb

## 2015-06-12 DIAGNOSIS — M542 Cervicalgia: Secondary | ICD-10-CM

## 2015-06-12 DIAGNOSIS — M5412 Radiculopathy, cervical region: Secondary | ICD-10-CM

## 2015-06-12 NOTE — Progress Notes (Signed)
HPI:  Acute visit for:  Neck Pain: -hx of cervical radiculopathy, CTS and advance spondylosis, mentioned only at her initial visit with me and managed by neurosurgeon per notes whom she sees yearly with MRI every 2 years -symptoms include significant mod to severe intermittnet neck pain, radiating to arms and tingling and numbness - worsened by long periods of typing, sitting or standing - especially over 7 hours at at time or with > 30 hours of work per week -reports: Dr. 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 Arnoldo Morale has limited her to 30 hours per week with a standing desk and intermittent movement at work advised by her nuerosurgeon -denies: worsening symptoms, weakness -aleve, gabapentin in the past - did not tolerate, HEP, PT in the past  ROS: See pertinent positives and negatives per HPI.  Past Medical History  Diagnosis Date  . Abnormal Pap smear 06/2004    ASCUS, + HPV, CIN II LEEP  . MITRAL VALVE PROLAPSE 08/05/2007    Qualifier: Diagnosis of  By: Tiney Rouge CMA, Ellison Hughs    . URINARY TRACT INFECTION, RECURRENT 12/06/2007    Qualifier: Diagnosis of  By: Arnoldo Morale MD, [place] SYNDROME, BILATERAL 10/28/2010    Qualifier: Diagnosis of  By: Arnoldo Morale MD, Balinda Quails     Past Surgical History  Procedure Laterality Date  . Colposcopy    . Cervical biopsy  w/ loop electrode excision  06/2004    CIN II  . Carpal tunnel release Right 08/2011  . Wisdom tooth extraction  age 78    Family History  Problem Relation Age of Onset  . Heart disease Mother   . Diabetes Mother   . Alzheimer's disease Mother   . Stroke Sister     tia and pacer  . Atrial fibrillation Sister   . Transient ischemic attack Sister     early alzhemeir     History   Social History  . Marital Status: Married    Spouse Name: N/A  . Number of Children: N/A  . Years of Education: N/A   Social History Main Topics  . Smoking status: Never Smoker   . Smokeless tobacco: Not on file  . Alcohol Use: No  . Drug Use: No  . Sexual Activity: Yes   Other Topics Concern  . 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  acyclovir (ZOVIRAX) 800 MG tablet, TID X 5 DAYS PRN, Disp: 28 tablet, Rfl: 11 .  Cholecalciferol (VITAMIN D) 1000 UNITS capsule, Take 1 capsule (1,000 Units total) by mouth 2 (two) times daily., Disp: , Rfl:  .  Glucosamine 750 MG TABS, Take 2 tablets by mouth daily., Disp: , Rfl:  .  KRILL OIL 1000 MG CAPS, Take 2 capsules (2,000 mg total) by mouth 2 (two) times daily., Disp: , Rfl:  .  Naproxen Sodium (ALEVE PO), Take by mouth as needed., Disp: , Rfl:  EXAM:  Filed Vitals:   06/12/15 1500  BP: 116/80  Pulse: 66  Temp: 98.3 F (36.8 C)    Body mass index is 24.42 kg/(m^2).  GENERAL: vitals reviewed and listed above, alert, oriented, appears well hydrated and in no acute distress  HEENT: atraumatic, conjunttiva clear, no obvious abnormalities on inspection of external nose and ears  NECK: no obvious masses on inspection other then head forward posture, TTP in cervical paraspinal muscles bilat  NEURO/MUSCULAR: ROM in head and neck and arms relatively normal, normal sensation to light touch, normal strength in bilat upper extremities  LUNGS: clear to auscultation bilaterally, no wheezes, rales or rhonchi, good air movement  CV: HRRR, no peripheral edema  MS: moves all extremities without noticeable abnormality  PSYCH: pleasant and cooperative, no obvious depression or anxiety  ASSESSMENT AND  PLAN:  Discussed the following assessment and plan:  Cervical radiculopathy - managed by neurosurgeon  Neck pain  ->25 minutes spent with this pt with > 50% spent face to face in counseling -will complete paperwork and have assistant return to pt -advised cont current acitivities and follow up with her treating specialist if any changes and for any future disability paperwork completion -Patient advised to return or notify a doctor immediately if symptoms worsen or persist or new concerns arise.  There are no Patient Instructions on file for this visit.   Colin Benton R.

## 2015-06-12 NOTE — Progress Notes (Signed)
Pre visit review using our clinic review tool, if applicable. No additional management support is needed unless otherwise documented below in the visit note. 

## 2015-06-13 ENCOUNTER — Telehealth: Payer: Self-pay | Admitting: *Deleted

## 2015-06-13 NOTE — Telephone Encounter (Signed)
I called the pt and informed her Dr Maudie Mercury completed the work accomodation forms and these were faxed to Cheyenne Myers, a copy was sent to be scanned and the originals were left at the front desk for her to pick up.

## 2015-06-14 ENCOUNTER — Telehealth: Payer: Self-pay | Admitting: Family Medicine

## 2015-06-14 NOTE — Telephone Encounter (Signed)
I called the pt and informed her Dr Maudie Mercury completed the addendum form, went over the answers to the questions and this was faxed to [employer].

## 2015-06-14 NOTE — Telephone Encounter (Signed)
Pt called to finish your conversation from earlier today. Pt would like to know you have the correct info on the form before you fax it. Pt would like a cb

## 2015-07-16 ENCOUNTER — Other Ambulatory Visit: Payer: Self-pay | Admitting: *Deleted

## 2015-07-16 MED ORDER — ACYCLOVIR 800 MG PO TABS: ORAL_TABLET | ORAL | Status: DC

## 2015-07-17 ENCOUNTER — Ambulatory Visit
Admission: RE | Admit: 2015-07-17 | Discharge: 2015-07-17 | Disposition: A | Discharge status: Home / Self Care | Payer: 59 | Source: Ambulatory Visit

## 2015-07-17 DIAGNOSIS — Z1231 Encounter for screening mammogram for malignant neoplasm of breast: Secondary | ICD-10-CM

## 2015-09-12 NOTE — Addendum Note (Signed)
Addended by: Antonietta Barcelona on: 09/12/2015 03:32 PM   Modules accepted: Orders

## 2015-09-21 ENCOUNTER — Encounter: Payer: Self-pay | Admitting: Family Medicine

## 2015-09-21 ENCOUNTER — Ambulatory Visit (INDEPENDENT_AMBULATORY_CARE_PROVIDER_SITE_OTHER): Payer: 59 | Admitting: Family Medicine

## 2015-09-21 VITALS — BP 120/78 | HR 66 | Temp 98.8°F | Ht 61.0 in | Wt 130.0 lb

## 2015-09-21 DIAGNOSIS — H109 Unspecified conjunctivitis: Secondary | ICD-10-CM

## 2015-09-21 MED ORDER — TOBRAMYCIN-DEXAMETHASONE 0.3-0.1 % OP SUSP: 2.0000 [drp] | OPHTHALMIC | Status: DC

## 2015-09-21 NOTE — Progress Notes (Signed)
Pre visit review using our clinic review tool, if applicable. No additional management support is needed unless otherwise documented below in the visit note. 

## 2015-09-24 ENCOUNTER — Encounter: Payer: Self-pay | Admitting: Family Medicine

## 2015-09-24 NOTE — Progress Notes (Signed)
   Subjective:    Patient ID: Cheyenne Myers, female    DOB: [birth year], 59 y.o.   MRN: [id number]  HPI Here for the onset this am of redness and burning in the left eye. No blurred vision, no light sensitivity. No URI sx.    Review of Systems  Constitutional: Negative.   HENT: Negative.   Eyes: Positive for redness and itching. Negative for photophobia, discharge and visual disturbance.  Respiratory: Negative.        Objective:   Physical Exam  Constitutional: She appears well-developed and well-nourished.  HENT:  Right Ear: External ear normal.  Left Ear: External ear normal.  Nose: Nose normal.  Mouth/Throat: Oropharynx is clear and moist.  Eyes:  Left conjunctiva is pink, cornea clear. The right eye is clear.  Pulmonary/Chest: Effort normal and breath sounds normal.          Assessment & Plan:  [place]. Treat with Tobradex.

## 2015-10-01 ENCOUNTER — Telehealth: Payer: Self-pay | Admitting: Internal Medicine

## 2015-10-01 NOTE — Telephone Encounter (Signed)
Received GI records from Dr. Watt Climes and placed on Dr. Celesta Aver desk for review. Patient is requesting Dr. Carlean Purl.

## 2015-10-05 NOTE — Telephone Encounter (Signed)
Dr. Carlean Purl reviewed records and has accepted patient. Ok to schedule colonoscopy. Informed patient of this and she will call back to schedule Direct Colonoscopy.

## 2015-11-21 ENCOUNTER — Encounter: Payer: Self-pay | Admitting: Internal Medicine

## 2015-12-03 ENCOUNTER — Encounter: Payer: Self-pay | Admitting: Family Medicine

## 2015-12-03 ENCOUNTER — Ambulatory Visit (INDEPENDENT_AMBULATORY_CARE_PROVIDER_SITE_OTHER): Payer: 59 | Admitting: Family Medicine

## 2015-12-03 VITALS — BP 108/72 | HR 79 | Temp 98.1°F | Ht 61.0 in | Wt 128.3 lb

## 2015-12-03 DIAGNOSIS — E785 Hyperlipidemia, unspecified: Secondary | ICD-10-CM

## 2015-12-03 DIAGNOSIS — F410 Panic disorder [episodic paroxysmal anxiety] without agoraphobia: Secondary | ICD-10-CM | POA: Diagnosis not present

## 2015-12-03 DIAGNOSIS — R739 Hyperglycemia, unspecified: Secondary | ICD-10-CM

## 2015-12-03 MED ORDER — LORAZEPAM 0.5 MG PO TABS: ORAL_TABLET | ORAL | Status: DC

## 2015-12-03 NOTE — Progress Notes (Signed)
HPI:  ROS: See pertinent positives and negatives per HPI.  Past Medical History  Diagnosis Date  . Abnormal Pap smear 06/2004    ASCUS, + HPV, CIN II LEEP  . MITRAL VALVE PROLAPSE 08/05/2007    Qualifier: Diagnosis of  By: Tiney Rouge CMA, Ellison Hughs    . URINARY TRACT INFECTION, RECURRENT 12/06/2007    Qualifier: Diagnosis of  By: Arnoldo Morale MD, [place] SYNDROME, BILATERAL 10/28/2010    Qualifier: Diagnosis of  By: Arnoldo Morale MD, Balinda Quails     Past Surgical History  Procedure Laterality Date  . Colposcopy    . Cervical biopsy  w/ loop electrode excision  06/2004    CIN II  . Carpal tunnel release Right 08/2011  . Wisdom tooth extraction  age 59    Family History  Problem Relation Age of Onset  . Heart disease Mother   . Diabetes Mother   . Alzheimer's disease Mother   . Stroke Sister     tia and pacer  . Atrial fibrillation Sister   . Transient ischemic attack Sister     early alzhemeir    Social History   Social History  . Marital Status: Married    Spouse Name: N/A  . Number of Children: N/A  . Years of Education: N/A   Social History Main Topics  . Smoking status: Never Smoker   . Smokeless tobacco: None  . Alcohol Use: No  . Drug Use: No  . Sexual Activity: Yes   Other Topics Concern  . 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  acyclovir (ZOVIRAX) 800 MG tablet, TID X 5 DAYS PRN, Disp: 28 tablet, Rfl: 2 .  Cholecalciferol (VITAMIN D) 1000 UNITS capsule, Take 1 capsule (1,000 Units total) by mouth 2 (two) times daily., Disp: , Rfl:  .  Glucosamine 750 MG TABS, Take 2 tablets by mouth daily., Disp: , Rfl:  .  KRILL OIL 1000 MG CAPS, Take 2 capsules (2,000 mg total) by mouth 2 (two) times daily., Disp: , Rfl:  .  Naproxen Sodium (ALEVE  PO), Take by mouth as needed., Disp: , Rfl:  .  tobramycin-dexamethasone (TOBRADEX) ophthalmic solution, Place 2 drops into the left eye every 4 (four) hours while awake., Disp: 5 mL, Rfl: 0  EXAM:  Filed Vitals:   12/03/15 1559  BP: 108/72  Pulse: 79  Temp: 98.1 F (36.7 C)    Body mass index is 24.25 kg/(m^2).  GENERAL: vitals reviewed and listed above, alert, oriented, appears well hydrated and in no acute distress  HEENT: atraumatic, conjunttiva clear, no obvious abnormalities on inspection of external nose and ears  NECK: no obvious masses on inspection  LUNGS: clear to auscultation bilaterally, no wheezes, rales or rhonchi, good air movement  CV: HRRR, no peripheral edema  MS: moves all extremities without noticeable abnormality  PSYCH: pleasant and cooperative, no obvious depression or anxiety  ASSESSMENT AND PLAN:  Discussed the following assessment and plan:  No diagnosis found.  -Patient advised to return or notify a doctor immediately if symptoms worsen or persist or new concerns arise.  There are no Patient Instructions on file for this visit.  Colin Benton R.

## 2015-12-03 NOTE — Patient Instructions (Signed)
BEFORE YOU LEAVE: -schedule follow up in about 4-6 months  We recommend the following healthy lifestyle measures: - eat a healthy whole foods diet consisting of regular small meals composed of vegetables, fruits, beans, nuts, seeds, healthy meats such as white chicken and fish and whole grains.  - avoid sweets, white starchy foods, fried foods, fast food, processed foods, sodas, red meet and other fattening foods.  - get a least 150-300 minutes of aerobic exercise per week.   Consider decreasing consumption of coconut oil

## 2015-12-03 NOTE — Progress Notes (Signed)
HPI:  Hyperlipidemia: -on labs sent from [facility] (she is in research study) -LDL 163 T 244 HDL 66 -reports chronic and prior PCP told her it was fine -she seems irritated that I am addressing this today and reports she is treating with lots of coconut oil which she has increased in the last several months -she reports she gets regular aerobic exercise and eats a very healthy diet and does not want to take medications -she feels the trig/hdal ratio is most important  Hyperglycemia: -Hgba1c 5.7  On labs from research center -see above  Panic disorder: -getting two MRIs with [facility] and reports given something to help her relax in the past prior to these exams -gets claustrophobia with these exams  ROS: See pertinent positives and negatives per HPI.  Past Medical History  Diagnosis Date  . Abnormal Pap smear 06/2004    ASCUS, + HPV, CIN II LEEP  . MITRAL VALVE PROLAPSE 08/05/2007    Qualifier: Diagnosis of  By: Tiney Rouge CMA, Ellison Hughs    . URINARY TRACT INFECTION, RECURRENT 12/06/2007    Qualifier: Diagnosis of  By: Arnoldo Morale MD, [place] SYNDROME, BILATERAL 10/28/2010    Qualifier: Diagnosis of  By: Arnoldo Morale MD, Balinda Quails     Past Surgical History  Procedure Laterality Date  . Colposcopy    . Cervical biopsy  w/ loop electrode excision  06/2004    CIN II  . Carpal tunnel release Right 08/2011  . Wisdom tooth extraction  age 89    Family History  Problem Relation Age of Onset  . Heart disease Mother   . Diabetes Mother   . Alzheimer's disease Mother   . Stroke Sister     tia and pacer  . Atrial fibrillation Sister   . Transient ischemic attack Sister     early alzhemeir    Social History   Social History  . Marital Status: Married    Spouse Name: N/A  . Number of Children: N/A  . Years of Education: N/A   Social History Main Topics  . Smoking status: Never Smoker   . Smokeless tobacco: None  . Alcohol Use: No  . Drug Use: No  . Sexual Activity: Yes    Other Topics Concern  . 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  acyclovir (ZOVIRAX) 800 MG tablet, TID X 5 DAYS PRN, Disp: 28 tablet, Rfl: 2 .  Cholecalciferol (VITAMIN D) 1000 UNITS capsule, Take 1 capsule (1,000 Units total) by mouth 2 (two) times daily., Disp: , Rfl:  .  Glucosamine 750 MG TABS, Take 2 tablets by mouth daily., Disp: , Rfl:  .  KRILL OIL 1000 MG CAPS, Take 2 capsules (2,000 mg total) by mouth 2 (two) times daily., Disp: , Rfl:  .  Naproxen Sodium (ALEVE PO), Take by mouth as needed., Disp: , Rfl:  .  tobramycin-dexamethasone (TOBRADEX) ophthalmic solution, Place 2 drops into the left eye every 4 (four) hours while awake., Disp: 5 mL, Rfl: 0 .  LORazepam (ATIVAN) 0.5 MG tablet, 1 tablet 30 minutes prior to procedure, Disp: 2 tablet, Rfl: 0  EXAM:  Filed Vitals:   12/03/15 1559  BP: 108/72  Pulse: 79  Temp: 98.1 F (36.7 C)    Body mass index is 24.25 kg/(m^2).  GENERAL: vitals reviewed and listed above, alert, oriented, appears well hydrated and in no acute distress  HEENT: atraumatic, conjunttiva clear, no obvious abnormalities on inspection of external nose and ears  NECK: no obvious masses on inspection  LUNGS: clear to auscultation bilaterally, no wheezes, rales or rhonchi, good air movement  CV: HRRR, no peripheral edema  MS: moves all extremities without noticeable abnormality  PSYCH: pleasant and cooperative, no obvious depression or anxiety  ASSESSMENT AND PLAN:  Discussed the following assessment and plan:  Hyperlipemia  Hyperglycemia  Panic disorder - Plan: LORazepam (ATIVAN) 0.5 MG tablet  -discussed her labs and reviewed treatment options  -she may consider cutting back a little on coconut oil as this is the main  change -she see very upset by these findings and upset that I discussed them with her - I tried to reassure her that the diabetes lab finding is very very mild and that with a continued good diet and regular exercise this is unlikely to progress to diabetes -follow up and recheck advised in 4-6 months -Patient advised to return or notify a doctor immediately if symptoms worsen or persist or new concerns arise.  Patient Instructions  BEFORE YOU LEAVE: -schedule follow up in about 4-6 months  We recommend the following healthy lifestyle measures: - eat a healthy whole foods diet consisting of regular small meals composed of vegetables, fruits, beans, nuts, seeds, healthy meats such as white chicken and fish and whole grains.  - avoid sweets, white starchy foods, fried foods, fast food, processed foods, sodas, red meet and other fattening foods.  - get a least 150-300 minutes of aerobic exercise per week.   Consider decreasing consumption of coconut oil    Cheyenne Myers R.

## 2015-12-03 NOTE — Progress Notes (Signed)
Pre visit review using our clinic review tool, if applicable. No additional management support is needed unless otherwise documented below in the visit note. 

## 2016-01-04 ENCOUNTER — Other Ambulatory Visit: Payer: Self-pay | Admitting: Neurological Surgery

## 2016-01-04 DIAGNOSIS — M47812 Spondylosis without myelopathy or radiculopathy, cervical region: Secondary | ICD-10-CM

## 2016-01-07 MED FILL — diazePAM 5 MG TABS: 5 | 2 days supply | Qty: 2 | Fill #0

## 2016-01-11 ENCOUNTER — Ambulatory Visit (INDEPENDENT_AMBULATORY_CARE_PROVIDER_SITE_OTHER): Payer: 59 | Admitting: Nurse Practitioner

## 2016-01-11 ENCOUNTER — Encounter: Payer: Self-pay | Admitting: Nurse Practitioner

## 2016-01-11 VITALS — BP 136/84 | HR 72 | Ht 61.0 in | Wt 125.0 lb

## 2016-01-11 DIAGNOSIS — Z01419 Encounter for gynecological examination (general) (routine) without abnormal findings: Secondary | ICD-10-CM

## 2016-01-11 DIAGNOSIS — E559 Vitamin D deficiency, unspecified: Secondary | ICD-10-CM

## 2016-01-11 DIAGNOSIS — Z1151 Encounter for screening for human papillomavirus (HPV): Secondary | ICD-10-CM | POA: Diagnosis not present

## 2016-01-11 DIAGNOSIS — Z0000 Encounter for general adult medical examination without abnormal findings: Secondary | ICD-10-CM

## 2016-01-11 NOTE — Patient Instructions (Addendum)

## 2016-01-11 NOTE — Progress Notes (Signed)
Patient ID: Cheyenne Myers, female   DOB: September 11, 1956, 60 y.o.   MRN: [id number]  60 y.o. G61P2002 Married  Caucasian Fe here for annual exam.  Feels well except for cervical neck pain - may need to have surgery.  Patient's last menstrual period was 10/17/2009 (exact date).          Sexually active: Yes.    The current method of family planning is post menopausal status.    Exercising: Yes.    walk Smoker:  no  Health Maintenance: Pap: 01/09/15, Negative with neg HR HPV (history of CIN-II) MMG:  07/17/15, Bi-Rads 1: Negative Colonoscopy:  10/2005, repeat in 10 years, scheduled for 02/07/16 with Dr Carlean Purl BMD: 01/17/15, T Score 0.6 Spine / -0.5 Right Femur Neck / -1.0 Left Femur Neck TDaP:  11/21/06  Hep C and HIV: done today Labs: 11/2015 scanned into EPIC  Urine: negative   reports that she has never smoked. She has never used smokeless tobacco. She reports that she does not drink alcohol or use illicit drugs.  Past Medical History  Diagnosis Date  . Abnormal Pap smear 06/2004    ASCUS, + HPV, CIN II LEEP  . MITRAL VALVE PROLAPSE 08/05/2007    Qualifier: Diagnosis of  By: Tiney Rouge CMA, Ellison Hughs    . URINARY TRACT INFECTION, RECURRENT 12/06/2007    Qualifier: Diagnosis of  By: Arnoldo Morale MD, [place] SYNDROME, BILATERAL 10/28/2010    Qualifier: Diagnosis of  By: Arnoldo Morale MD, Balinda Quails     Past Surgical History  Procedure Laterality Date  . Colposcopy    . Cervical biopsy  w/ loop electrode excision  06/2004    CIN II  . Carpal tunnel release Right 08/2011  . Wisdom tooth extraction  age 55    Current Outpatient Prescriptions  Medication Sig Dispense Refill  . acyclovir (ZOVIRAX) 800 MG tablet TID X 5 DAYS PRN 28 tablet 2  . Cholecalciferol (VITAMIN D) 1000 UNITS capsule Take 1 capsule (1,000 Units total) by mouth 2 (two) times daily.    . Glucosamine 750 MG TABS Take 2 tablets by mouth daily.    Marland Kitchen KRILL OIL 1000 MG CAPS Take 2 capsules (2,000 mg total) by mouth 2 (two) times  daily.    . Naproxen Sodium (ALEVE PO) Take by mouth as needed.    . tobramycin-dexamethasone (TOBRADEX) ophthalmic solution Place 2 drops into the left eye every 4 (four) hours while awake. 5 mL 0   No current facility-administered medications for this visit.    Family History  Problem Relation Age of Onset  . Heart disease Mother   . Diabetes Mother   . Alzheimer's disease Mother   . Stroke Sister     tia and pacer  . Atrial fibrillation Sister   . Transient ischemic attack Sister     early alzhemeir    ROS:  Pertinent items are noted in HPI.  Otherwise, a comprehensive ROS was negative.  Exam:   BP 136/84 mmHg  Pulse 72  Ht 5\' 1"  (1.549 m)  Wt 125 lb (56.7 kg)  BMI 23.63 kg/m2  LMP 10/17/2009 (Exact Date) Height: 5\' 1"  (154.9 cm) Ht Readings from Last 3 Encounters:  01/11/16 5\' 1"  (1.549 m)  12/03/15 5\' 1"  (1.549 m)  09/21/15 5\' 1"  (1.549 m)    General appearance: alert, cooperative and appears stated age Head: Normocephalic, without obvious abnormality, atraumatic Neck: no adenopathy, supple, symmetrical, trachea midline and thyroid normal to inspection and  palpation Lungs: clear to auscultation bilaterally Breasts: normal appearance, no masses or tenderness Heart: regular rate and rhythm Abdomen: soft, non-tender; no masses,  no organomegaly Extremities: extremities normal, atraumatic, no cyanosis or edema Skin: Skin color, texture, turgor normal. No rashes or lesions Lymph nodes: Cervical, supraclavicular, and axillary nodes normal. No abnormal inguinal nodes palpated Neurologic: Grossly normal   Pelvic: External genitalia:  no lesions              Urethra:  normal appearing urethra with no masses, tenderness or lesions              Bartholin's and Skene's: normal                 Vagina: normal appearing vagina with normal color and discharge, no lesions              Cervix: anteverted              Pap taken: Yes.   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   P:   Reviewed health and wellness pertinent to exam  Pap smear as above  Mammogram is due 06/2016  Will follow with labs  Counseled on breast self exam, mammography screening, adequate intake of calcium and vitamin D, diet and exercise, Kegel's exercises return annually or prn  An After Visit Summary was printed and given to the patient.

## 2016-01-12 LAB — HIV ANTIBODY (ROUTINE TESTING W REFLEX): HIV 1&2 Ab, 4th Generation: NONREACTIVE

## 2016-01-12 LAB — VITAMIN D 25 HYDROXY (VIT D DEFICIENCY, FRACTURES): VIT D 25 HYDROXY: 39 ng/mL (ref 30–100)

## 2016-01-12 LAB — HEPATITIS C ANTIBODY: HCV Ab: NEGATIVE

## 2016-01-15 LAB — IPS PAP TEST WITH HPV

## 2016-01-16 ENCOUNTER — Ambulatory Visit
Admission: RE | Admit: 2016-01-16 | Discharge: 2016-01-16 | Disposition: A | Discharge status: Home / Self Care | Payer: 59 | Source: Ambulatory Visit | Attending: Neurological Surgery | Admitting: Neurological Surgery

## 2016-01-16 DIAGNOSIS — M4806 Spinal stenosis, lumbar region: Secondary | ICD-10-CM | POA: Diagnosis not present

## 2016-01-16 DIAGNOSIS — M47812 Spondylosis without myelopathy or radiculopathy, cervical region: Secondary | ICD-10-CM

## 2016-01-21 NOTE — Progress Notes (Signed)
Encounter reviewed by Dr. Brook Amundson C. Silva.  

## 2016-01-24 ENCOUNTER — Ambulatory Visit (AMBULATORY_SURGERY_CENTER): Payer: Self-pay

## 2016-01-24 VITALS — Ht 61.0 in | Wt 125.4 lb

## 2016-01-24 DIAGNOSIS — Z1211 Encounter for screening for malignant neoplasm of colon: Secondary | ICD-10-CM

## 2016-01-24 NOTE — Progress Notes (Signed)
No allergies to eggs or soy No past problems with anesthesia No home oxygen No diet/weight loss meds  Has email and internet; registered for emmi 

## 2016-02-07 ENCOUNTER — Encounter: Payer: Self-pay | Admitting: Internal Medicine

## 2016-02-07 ENCOUNTER — Ambulatory Visit (AMBULATORY_SURGERY_CENTER): Payer: 59 | Admitting: Internal Medicine

## 2016-02-07 VITALS — BP 117/77 | HR 59 | Temp 96.3°F | Resp 11 | Ht 63.0 in | Wt 125.0 lb

## 2016-02-07 DIAGNOSIS — K635 Polyp of colon: Secondary | ICD-10-CM | POA: Diagnosis not present

## 2016-02-07 DIAGNOSIS — D123 Benign neoplasm of transverse colon: Secondary | ICD-10-CM | POA: Diagnosis not present

## 2016-02-07 DIAGNOSIS — I341 Nonrheumatic mitral (valve) prolapse: Secondary | ICD-10-CM | POA: Diagnosis not present

## 2016-02-07 DIAGNOSIS — Z1211 Encounter for screening for malignant neoplasm of colon: Secondary | ICD-10-CM

## 2016-02-07 MED ORDER — SODIUM CHLORIDE 0.9 % IV SOLN: 500.0000 mL | INTRAVENOUS | Status: DC

## 2016-02-07 NOTE — Progress Notes (Signed)
YOU HAD AN ENDOSCOPIC PROCEDURE TODAY AT THE [facility]:   Refer to the procedure report that was given to you for any specific questions about what was found during the examination.  If the procedure report does not answer your questions, please call your gastroenterologist to clarify.  If you requested that your care partner not be given the details of your procedure findings, then the procedure report has been included in a sealed envelope for you to review at your convenience later.  YOU SHOULD EXPECT: Some feelings of bloating in the abdomen. Passage of more gas than usual.  Walking can help get rid of the air that was put into your GI tract during the procedure and reduce the bloating. If you had a lower endoscopy (such as a colonoscopy or flexible sigmoidoscopy) you may notice spotting of blood in your stool or on the toilet paper. If you underwent a bowel prep for your procedure, you may not have a normal bowel movement for a few days.  Please Note:  You might notice some irritation and congestion in your nose or some drainage.  This is from the oxygen used during your procedure.  There is no need for concern and it should clear up in a day or so.  SYMPTOMS TO REPORT IMMEDIATELY:   Following lower endoscopy (colonoscopy or flexible sigmoidoscopy):  Excessive amounts of blood in the stool  Significant tenderness or worsening of abdominal pains  Swelling of the abdomen that is new, acute  Fever of 100F or higher   For urgent or emergent issues, a gastroenterologist can be reached at any hour by calling [phone].   DIET: Your first meal following the procedure should be a small meal and then it is ok to progress to your normal diet. Heavy or fried foods are harder to digest and may make you feel nauseous or bloated.  Likewise, meals heavy in dairy and vegetables can increase bloating.  Drink plenty of fluids but you should avoid alcoholic beverages for 24  hours.  ACTIVITY:  You should plan to take it easy for the rest of today and you should NOT DRIVE or use heavy machinery until tomorrow (because of the sedation medicines used during the test).    FOLLOW UP: Our staff will call the number listed on your records the next business day following your procedure to check on you and address any questions or concerns that you may have regarding the information given to you following your procedure. If we do not reach you, we will leave a message.  However, if you are feeling well and you are not experiencing any problems, there is no need to return our call.  We will assume that you have returned to your regular daily activities without incident.  If any biopsies were taken you will be contacted by phone or by letter within the next 1-3 weeks.  Please call us at [phone] if you have not heard about the biopsies in 3 weeks.    SIGNATURES/CONFIDENTIALITY: You and/or your care partner have signed paperwork which will be entered into your electronic medical record.  These signatures attest to the fact that that the information above on your After Visit Summary has been reviewed and is understood.  Full responsibility of the confidentiality of this discharge information lies with you and/or your care-partner.     Handout was given to your care partner on polyps. You may resume your current medications today. Await biopsy results. Please call   if any questions or concerns.   

## 2016-02-07 NOTE — Progress Notes (Signed)
Called to room to assist during endoscopic procedure.  Patient ID and intended procedure confirmed with present staff. Received instructions for my participation in the procedure from the performing physician.  

## 2016-02-07 NOTE — Op Note (Signed)
[place]  [address] [place], [id number]   COLONOSCOPY PROCEDURE REPORT  PATIENT: Cheyenne, Myers  MR#: [id number] BIRTHDATE: February 26, 1956 , 18  yrs. old GENDER: female ENDOSCOPIST: [name], MD, [facility] PROCEDURE DATE:  02/07/2016 PROCEDURE:   Colonoscopy with snare polypectomy and Colonoscopy, screening First Screening Colonoscopy - Avg.  risk and is 50 yrs.  old or older - No.  Prior Negative Screening - Now for repeat screening. 10 or more years since last screening  History of Adenoma - Now for follow-up colonoscopy & has been > or = to 3 yrs.  N/A  Polyps removed today? Yes ASA CLASS:   Class II INDICATIONS:Screening for colonic neoplasia and Colorectal Neoplasm Risk Assessment for this procedure is average risk. MEDICATIONS: Propofol 200 mg IV, Monitored anesthesia care, and Lidocaine 40 mg IV  DESCRIPTION OF PROCEDURE:   After the risks benefits and alternatives of the procedure were thoroughly explained, informed consent was obtained.  The digital rectal exam revealed no abnormalities of the rectum.   The LB [id number]  endoscope was introduced through the anus and advanced to the cecum, which was identified by both the appendix and ileocecal valve. No adverse events experienced.   The quality of the prep was excellent. (MiraLax was used)  The instrument was then slowly withdrawn as the colon was fully examined. Estimated blood loss is zero unless otherwise noted in this procedure report.      COLON FINDINGS: A polypoid shaped sessile polyp measuring 3 mm in size was found in the transverse colon.  A polypectomy was performed with a cold snare.  The resection was complete, the polyp tissue was completely retrieved and sent to histology.   The examination was otherwise normal.  Retroflexed views revealed no abnormalities. The time to cecum = 2.7 Withdrawal time = 9.6   The scope was withdrawn and the procedure completed. COMPLICATIONS:  There were no immediate complications.  ENDOSCOPIC IMPRESSION: 1.   Sessile polyp was found in the transverse colon; polypectomy was performed with a cold snare 2.   The examination was otherwise normal  RECOMMENDATIONS: Timing of repeat colonoscopy will be determined by pathology findings.  eSigned:  [name], MD, [facility] 02/07/2016 9:09 AM   cc: The Patient and Dr. Colin Benton

## 2016-02-07 NOTE — Progress Notes (Signed)
No problems noted in the recovery room. maw 

## 2016-02-07 NOTE — Patient Instructions (Addendum)
I found and removed one tiny polyp that looks benign. I will let you know pathology results and when to have another routine colonoscopy by mail.  I appreciate the opportunity to care for you. [name], MD, FACG    YOU HAD AN ENDOSCOPIC PROCEDURE TODAY AT [facility]:   Refer to the procedure report that was given to you for any specific questions about what was found during the examination.  If the procedure report does not answer your questions, please call your gastroenterologist to clarify.  If you requested that your care partner not be given the details of your procedure findings, then the procedure report has been included in a sealed envelope for you to review at your convenience later.  YOU SHOULD EXPECT: Some feelings of bloating in the abdomen. Passage of more gas than usual.  Walking can help get rid of the air that was put into your GI tract during the procedure and reduce the bloating. If you had a lower endoscopy (such as a colonoscopy or flexible sigmoidoscopy) you may notice spotting of blood in your stool or on the toilet paper. If you underwent a bowel prep for your procedure, you may not have a normal bowel movement for a few days.  Please Note:  You might notice some irritation and congestion in your nose or some drainage.  This is from the oxygen used during your procedure.  There is no need for concern and it should clear up in a day or so.  SYMPTOMS TO REPORT IMMEDIATELY:   Following lower endoscopy (colonoscopy or flexible sigmoidoscopy):  Excessive amounts of blood in the stool  Significant tenderness or worsening of abdominal pains  Swelling of the abdomen that is new, acute  Fever of 100F or higher   For urgent or emergent issues, a gastroenterologist can be reached at any hour by calling [phone].   DIET: Your first meal following the procedure should be a small meal and then it is ok to progress to your normal diet.  Heavy or fried foods are harder to digest and may make you feel nauseous or bloated.  Likewise, meals heavy in dairy and vegetables can increase bloating.  Drink plenty of fluids but you should avoid alcoholic beverages for 24 hours.  ACTIVITY:  You should plan to take it easy for the rest of today and you should NOT DRIVE or use heavy machinery until tomorrow (because of the sedation medicines used during the test).    FOLLOW UP: Our staff will call the number listed on your records the next business day following your procedure to check on you and address any questions or concerns that you may have regarding the information given to you following your procedure. If we do not reach you, we will leave a message.  However, if you are feeling well and you are not experiencing any problems, there is no need to return our call.  We will assume that you have returned to your regular daily activities without incident.  If any biopsies were taken you will be contacted by phone or by letter within the next 1-3 weeks.  Please call us at [phone] if you have not heard about the biopsies in 3 weeks.    SIGNATURES/CONFIDENTIALITY: You and/or your care partner have signed paperwork which will be entered into your electronic medical record.  These signatures attest to the fact that that the information above on your After Visit Summary has been reviewed  and is understood.  Full responsibility of the confidentiality of this discharge information lies with you and/or your care-partner.     Handout was given to your care partner on polyps. You may resume your current medications today. Await biopsy results. Please call if any questions or concerns.

## 2016-02-07 NOTE — Progress Notes (Signed)
Stable to RR 

## 2016-02-08 ENCOUNTER — Telehealth: Payer: Self-pay | Admitting: *Deleted

## 2016-02-08 NOTE — Telephone Encounter (Signed)
  Follow up Call-  Call back number 02/07/2016  Post procedure Call Back phone  # [phone]  Permission to leave phone message Yes     Patient questions:  Do you have a fever, pain , or abdominal swelling? No. Pain Score  0 *  Have you tolerated food without any problems? Yes.    Have you been able to return to your normal activities? Yes.    Do you have any questions about your discharge instructions: Diet   No. Medications  No. Follow up visit  No.  Do you have questions or concerns about your Care? No.  Actions: * If pain score is 4 or above: No action needed, pain <4.

## 2016-02-13 ENCOUNTER — Encounter: Payer: Self-pay | Admitting: Internal Medicine

## 2016-02-13 DIAGNOSIS — Z860101 Personal history of adenomatous and serrated colon polyps: Secondary | ICD-10-CM

## 2016-02-13 DIAGNOSIS — Z86010 Personal history of colonic polyps: Secondary | ICD-10-CM

## 2016-02-13 HISTORY — DX: Personal history of adenomatous and serrated colon polyps: Z86.0101

## 2016-02-13 HISTORY — DX: Personal history of colonic polyps: Z86.010

## 2016-02-13 NOTE — Progress Notes (Signed)
Quick Note:  3 mm adenoma Recall colon 2024   ______

## 2016-02-21 ENCOUNTER — Encounter: Payer: Self-pay | Admitting: Family Medicine

## 2016-02-21 ENCOUNTER — Ambulatory Visit (INDEPENDENT_AMBULATORY_CARE_PROVIDER_SITE_OTHER): Payer: 59 | Admitting: Family Medicine

## 2016-02-21 VITALS — BP 120/76 | HR 74 | Temp 97.5°F | Ht 61.0 in | Wt 123.4 lb

## 2016-02-21 DIAGNOSIS — Z0000 Encounter for general adult medical examination without abnormal findings: Secondary | ICD-10-CM

## 2016-02-21 DIAGNOSIS — R739 Hyperglycemia, unspecified: Secondary | ICD-10-CM

## 2016-02-21 DIAGNOSIS — E785 Hyperlipidemia, unspecified: Secondary | ICD-10-CM

## 2016-02-21 LAB — LIPID PANEL
CHOLESTEROL: 209 mg/dL — AB (ref 0–200)
HDL: 54.1 mg/dL (ref >39.00)
LDL CALC: 133 mg/dL — AB (ref 0–99)
NonHDL: 154.86
TRIGLYCERIDES: 110 mg/dL (ref 0.0–149.0)
Total CHOL/HDL Ratio: 4
VLDL: 22 mg/dL (ref 0.0–40.0)

## 2016-02-21 LAB — HEMOGLOBIN A1C: HEMOGLOBIN A1C: 5.5 % (ref 4.6–6.5)

## 2016-02-21 NOTE — Patient Instructions (Signed)
BEFORE YOU LEAVE: -labs -physical yearly  -We have ordered labs or studies at this visit. It can take up to 1-2 weeks for results and processing. We will contact you with instructions IF your results are abnormal. Normal results will be released to your [facility]. If you have not heard from [place] or can not find your results in [facility] in 2 weeks please contact our office.  We recommend the following healthy lifestyle measures: - eat a healthy whole foods diet consisting of regular small meals composed of vegetables, fruits, beans, nuts, seeds, healthy meats such as white chicken and fish and whole grains.  - avoid sweets, white starchy foods, fried foods, fast food, processed foods, sodas, red meet and other fattening foods.  - get a least 150-300 minutes of aerobic exercise per week.

## 2016-02-21 NOTE — Progress Notes (Signed)
HPI:  Here for CPE:  -Concerns and/or follow up today:   Hyperlipidemia: -on labs sent from [facility] (she is in research study) -LDL 163 T 244 HDL 66 11/2015 -reports chronic and prior PCP told her it was fine -she seemed irritated when I mentioned it in the past -she was treating with lots of coconut oil and reports she has cut back on this and is actually happy that this has resulted in some weight loss -she reports she gets regular aerobic exercise and eats a very healthy diet and does not want to take medications -she feels the trig/hdl ratio is most important  Hyperglycemia: -Hgba1c 5.7on labs from research center 11/2015 -see above -asymptomatic  Neck Pain/CTS/ -hx cervical radiculopathy -managed by [facility]  -Taking folic acid, vitamin D or calcium: yes  -Hx of HTN: no  -Vaccines: UTD  -pap history: sees [facility], 12/2015  -wants STI testing (Hep C if born 1945-65): no, done with gyn  -FH breast, colon or ovarian ca: see FH Last mammogram: UTD Last colon cancer screening: UTD, letter from GI printed for her  Breast Ca Risk Assessment: -sees gyn for breast health  -Alcohol, Tobacco, drug use: see social history  Review of Systems - no fevers, unintentional weight loss, vision loss, hearing loss, chest pain, sob, hemoptysis, melena, hematochezia, hematuria, genital discharge, changing or concerning skin lesions, bleeding, bruising, loc, thoughts of self harm or SI  Past Medical History  Diagnosis Date  . Abnormal Pap smear 06/2004    ASCUS, + HPV, CIN II LEEP  . MITRAL VALVE PROLAPSE 08/05/2007    Qualifier: Diagnosis of  By: Tiney Rouge CMA, Ellison Hughs    . URINARY TRACT INFECTION, RECURRENT 12/06/2007    Qualifier: Diagnosis of  By: Arnoldo Morale MD, [place] SYNDROME, BILATERAL 10/28/2010    Qualifier: Diagnosis of  By: Arnoldo Morale MD, Balinda Quails   . Hx of adenomatous polyp of colon 02/13/2016  . Cervical radicular pain     managed by [facility]  . Hyperlipemia      Past Surgical History  Procedure Laterality Date  . Colposcopy    . Cervical biopsy  w/ loop electrode excision  06/2004    CIN II  . Carpal tunnel release Right 08/2011  . Wisdom tooth extraction  age 62    Family History  Problem Relation Age of Onset  . Heart disease Mother   . Diabetes Mother   . Alzheimer's disease Mother   . Stroke Sister     tia and pacer  . Atrial fibrillation Sister   . Transient ischemic attack Sister     early alzhemeir  . Colon cancer Neg Hx     Social History   Social History  . Marital Status: Married    Spouse Name: N/A  . Number of Children: N/A  . Years of Education: N/A   Social History Main Topics  . Smoking status: Never Smoker   . Smokeless tobacco: Never Used  . Alcohol Use: No  . Drug Use: No  . Sexual Activity:    Partners: Male    Birth Control/ Protection: Post-menopausal   Other Topics Concern  . 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  acyclovir (ZOVIRAX) 800 MG tablet, TID X 5 DAYS PRN, Disp: 28 tablet, Rfl: 2 .  Cholecalciferol (VITAMIN D) 1000 UNITS capsule, Take 1 capsule (1,000 Units total) by mouth 2 (two) times daily., Disp: , Rfl:  .  Coenzyme Q10 (CO Q 10) 100 MG CAPS, Take by mouth., Disp: , Rfl:  .  Glucosamine 750 MG TABS, Take 2 tablets by mouth daily., Disp: , Rfl:  .  KRILL OIL 1000 MG CAPS, Take 2 capsules (2,000 mg total) by mouth 2 (two) times daily., Disp: , Rfl:  .  Naproxen Sodium (ALEVE PO), Take by mouth as needed. Reported on 02/07/2016, Disp: , Rfl:  .  Probiotic Product (PROBIOTIC DAILY PO), Take by mouth daily., Disp: , Rfl:   EXAM:  Filed Vitals:   02/21/16 0831  BP: 120/76  Pulse: 74  Temp: 97.5 F (36.4 C)    GENERAL: vitals reviewed and listed below, alert,  oriented, appears well hydrated and in no acute distress  HEENT: head atraumatic, PERRLA, normal appearance of eyes, ears, nose and mouth. moist mucus membranes.  NECK: supple, no masses or lymphadenopathy  LUNGS: clear to auscultation bilaterally, no rales, rhonchi or wheeze  CV: HRRR, no peripheral edema or cyanosis, normal pedal pulses  BREAST: declined, does with gyn  ABDOMEN: bowel sounds normal, soft, non tender to palpation, no masses, no rebound or guarding  GU: does with gyn  SKIN: no rash or abnormal lesions - declined full skin exam as does with dermatologist  MS: normal gait, moves all extremities normally  NEURO: CN II-XII grossly intact, normal muscle strength and sensation to light touch on extremities  PSYCH: normal affect, pleasant and cooperative  ASSESSMENT AND PLAN:  Discussed the following assessment and plan:  Visit for preventive health examination  Hyperlipemia - Plan: Lipid Panel  Hyperglycemia - Plan: Hemoglobin A1c  -Discussed and advised all [place] preventive services health task force level A and B recommendations for age, sex and risks.  -Advised at least 150 minutes of exercise per week and a healthy diet low in saturated fats and sweets and consisting of fresh fruits and vegetables, lean meats such as fish and white chicken and whole grains.  -FASTING labs, studies and vaccines per orders this encounter  Orders Placed This Encounter  Procedures  . Lipid Panel  . Hemoglobin A1c    Patient advised to return to clinic immediately if symptoms worsen or persist or new concerns.  Patient Instructions  BEFORE YOU LEAVE: -labs -physical yearly  -We have ordered labs or studies at this visit. It can take up to 1-2 weeks for results and processing. We will contact you with instructions IF your results are abnormal. Normal results will be released to your [facility]. If you have not heard from [place] or can not find your results in [facility] in 2 weeks  please contact our office.  We recommend the following healthy lifestyle measures: - eat a healthy whole foods diet consisting of regular small meals composed of vegetables, fruits, beans, nuts, seeds, healthy meats such as white chicken and fish and whole grains.  - avoid sweets, white starchy foods, fried foods, fast food, processed foods, sodas, red meet and other fattening foods.  - get a least 150-300 minutes of aerobic exercise per week.           No Follow-up on file.  Colin Benton R.

## 2016-02-21 NOTE — Progress Notes (Signed)
Pre visit review using our clinic review tool, if applicable. No additional management support is needed unless otherwise documented below in the visit note. 

## 2016-03-05 DIAGNOSIS — H35033 Hypertensive retinopathy, bilateral: Secondary | ICD-10-CM | POA: Diagnosis not present

## 2016-03-05 DIAGNOSIS — H40013 Open angle with borderline findings, low risk, bilateral: Secondary | ICD-10-CM | POA: Diagnosis not present

## 2016-03-05 DIAGNOSIS — H5213 Myopia, bilateral: Secondary | ICD-10-CM | POA: Diagnosis not present

## 2016-03-05 DIAGNOSIS — D3131 Benign neoplasm of right choroid: Secondary | ICD-10-CM | POA: Diagnosis not present

## 2016-03-05 DIAGNOSIS — H179 Unspecified corneal scar and opacity: Secondary | ICD-10-CM | POA: Diagnosis not present

## 2016-03-05 DIAGNOSIS — H524 Presbyopia: Secondary | ICD-10-CM | POA: Diagnosis not present

## 2016-03-06 DIAGNOSIS — M1288 Other specific arthropathies, not elsewhere classified, other specified site: Secondary | ICD-10-CM | POA: Diagnosis not present

## 2016-05-09 MED FILL — ACYCLOVIR 800 MG TABLET: 800 | 9 days supply | Qty: 28 | Fill #1

## 2016-06-19 ENCOUNTER — Encounter: Payer: Self-pay | Admitting: Family Medicine

## 2016-06-19 ENCOUNTER — Ambulatory Visit (INDEPENDENT_AMBULATORY_CARE_PROVIDER_SITE_OTHER): Payer: 59 | Admitting: Family Medicine

## 2016-06-19 VITALS — BP 108/70 | HR 71 | Temp 98.1°F | Ht 61.0 in | Wt 118.2 lb

## 2016-06-19 DIAGNOSIS — M79646 Pain in unspecified finger(s): Secondary | ICD-10-CM

## 2016-06-19 NOTE — Progress Notes (Signed)
Pre visit review using our clinic review tool, if applicable. No additional management support is needed unless otherwise documented below in the visit note. 

## 2016-06-19 NOTE — Progress Notes (Signed)
HPI:  Cheyenne Myers is here for an acute visit for pain in her finger. She reports she woke up with a painful bump on her left index finger. She thinks that something bit her as she had a spider bite in the past that felt similar to this. No significant pruritus, rash elsewhere, fevers, malaise, joint swelling or streaking of the redness up the finger or hand.  ROS: See pertinent positives and negatives per HPI.  Past Medical History  Diagnosis Date  . Abnormal Pap smear 06/2004    ASCUS, + HPV, CIN II LEEP  . MITRAL VALVE PROLAPSE 08/05/2007    Qualifier: Diagnosis of  By: Tiney Rouge CMA, Ellison Hughs    . URINARY TRACT INFECTION, RECURRENT 12/06/2007    Qualifier: Diagnosis of  By: Arnoldo Morale MD, [place] SYNDROME, BILATERAL 10/28/2010    Qualifier: Diagnosis of  By: Arnoldo Morale MD, Balinda Quails   . Hx of adenomatous polyp of colon 02/13/2016  . Cervical radicular pain     managed by [facility]  . Hyperlipemia     Past Surgical History  Procedure Laterality Date  . Colposcopy    . Cervical biopsy  w/ loop electrode excision  06/2004    CIN II  . Carpal tunnel release Right 08/2011  . Wisdom tooth extraction  age 81    Family History  Problem Relation Age of Onset  . Heart disease Mother   . Diabetes Mother   . Alzheimer's disease Mother   . Stroke Sister     tia and pacer  . Atrial fibrillation Sister   . Transient ischemic attack Sister     early alzhemeir  . Colon cancer Neg Hx     Social History   Social History  . Marital Status: Married    Spouse Name: N/A  . Number of Children: N/A  . Years of Education: N/A   Social History Main Topics  . Smoking status: Never Smoker   . Smokeless tobacco: Never Used  . Alcohol Use: No  . Drug Use: No  . Sexual Activity:    Partners: Male    Birth Control/ Protection: Post-menopausal   Other Topics Concern  . 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  acyclovir (ZOVIRAX) 800 MG tablet, TID X 5 DAYS PRN, Disp: 28 tablet, Rfl: 2 .  Cholecalciferol (VITAMIN D) 1000 UNITS capsule, Take 1 capsule (1,000 Units total) by mouth 2 (two) times daily., Disp: , Rfl:  .  Coenzyme Q10 (CO Q 10) 100 MG CAPS, Take by mouth., Disp: , Rfl:  .  Glucosamine 750 MG TABS, Take 2 tablets by mouth daily., Disp: , Rfl:  .  KRILL OIL 1000 MG CAPS, Take 2 capsules (2,000 mg total) by mouth 2 (two) times daily., Disp: , Rfl:  .  Naproxen Sodium (ALEVE PO), Take by mouth as needed. Reported on 02/07/2016, Disp: , Rfl:  .  Probiotic Product (PROBIOTIC DAILY PO), Take by mouth daily., Disp: , Rfl:   EXAM:  Filed Vitals:   06/19/16 1532  BP: 108/70  Pulse: 71  Temp: 98.1 F (36.7 C)    Body mass index is 22.35 kg/(m^2).  GENERAL: vitals reviewed and listed above, alert, oriented, appears well hydrated and in no acute distress  HEENT: atraumatic, conjunttiva clear, no obvious abnormalities on inspection of external nose and ears  NECK: no obvious masses on inspection  SKIN: Small sub-centimeter papule on dorsal distal left index finger with small dot in the center. No joint swelling or redness. No warmth, redness or streaking up the finger or the hand.  MS: moves all extremities without noticeable abnormality  PSYCH: pleasant and cooperative, no obvious depression or anxiety  ASSESSMENT AND PLAN:  Discussed the following assessment and plan:  Pain of finger, unspecified laterality  -we discussed possible serious and likely etiologies, workup and treatment, treatment risks and return precautions - suspect insect bite -after this discussion, Chealsy opted for observation, hydrocortisone cream, ice -follow up advised as needed if worsening, increased redness or swelling, feeling sick or any other concerns -of  course, we advised Shereda  to return or notify a doctor immediately if symptoms worsen or persist or new concerns arise.  There are no Patient Instructions on file for this visit.  Colin Benton R., DO

## 2016-06-20 ENCOUNTER — Encounter: Payer: Self-pay | Admitting: Family Medicine

## 2016-06-20 ENCOUNTER — Ambulatory Visit (INDEPENDENT_AMBULATORY_CARE_PROVIDER_SITE_OTHER): Payer: 59 | Admitting: Family Medicine

## 2016-06-20 VITALS — BP 100/62 | HR 68 | Temp 98.3°F | Ht 61.0 in

## 2016-06-20 DIAGNOSIS — L989 Disorder of the skin and subcutaneous tissue, unspecified: Secondary | ICD-10-CM

## 2016-06-20 MED ORDER — TRIAMCINOLONE ACETONIDE 0.1 % EX CREA: 1.0000 "application " | TOPICAL_CREAM | Freq: Two times a day (BID) | CUTANEOUS | Status: DC

## 2016-06-20 MED ORDER — CEPHALEXIN 500 MG PO CAPS: 500.0000 mg | ORAL_CAPSULE | Freq: Three times a day (TID) | ORAL | Status: DC

## 2016-06-20 NOTE — Progress Notes (Signed)
HPI:  R Index finger: -erythema  -? Insect bite - see note from yesterday -she had to write all day with this area pressing on pen and feels it was redder and more painful - now better -no blisters, drainage, pus, fevers, malaise, hand swelling  ROS: See pertinent positives and negatives per HPI.  Past Medical History  Diagnosis Date  . Abnormal Pap smear 06/2004    ASCUS, + HPV, CIN II LEEP  . MITRAL VALVE PROLAPSE 08/05/2007    Qualifier: Diagnosis of  By: Tiney Rouge CMA, Ellison Hughs    . URINARY TRACT INFECTION, RECURRENT 12/06/2007    Qualifier: Diagnosis of  By: Arnoldo Morale MD, [place] SYNDROME, BILATERAL 10/28/2010    Qualifier: Diagnosis of  By: Arnoldo Morale MD, Balinda Quails   . Hx of adenomatous polyp of colon 02/13/2016  . Cervical radicular pain     managed by [facility]  . Hyperlipemia     Past Surgical History  Procedure Laterality Date  . Colposcopy    . Cervical biopsy  w/ loop electrode excision  06/2004    CIN II  . Carpal tunnel release Right 08/2011  . Wisdom tooth extraction  age 35    Family History  Problem Relation Age of Onset  . Heart disease Mother   . Diabetes Mother   . Alzheimer's disease Mother   . Stroke Sister     tia and pacer  . Atrial fibrillation Sister   . Transient ischemic attack Sister     early alzhemeir  . Colon cancer Neg Hx     Social History   Social History  . Marital Status: Married    Spouse Name: N/A  . Number of Children: N/A  . Years of Education: N/A   Social History Main Topics  . Smoking status: Never Smoker   . Smokeless tobacco: Never Used  . Alcohol Use: No  . Drug Use: No  . Sexual Activity:    Partners: Male    Birth Control/ Protection: Post-menopausal   Other Topics Concern  . 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  acyclovir (ZOVIRAX) 800 MG tablet, TID X 5 DAYS PRN, Disp: 28 tablet, Rfl: 2 .  Cholecalciferol (VITAMIN D) 1000 UNITS capsule, Take 1 capsule (1,000 Units total) by mouth 2 (two) times daily., Disp: , Rfl:  .  Coenzyme Q10 (CO Q 10) 100 MG CAPS, Take by mouth., Disp: , Rfl:  .  Glucosamine 750 MG TABS, Take 2 tablets by mouth daily., Disp: , Rfl:  .  KRILL OIL 1000 MG CAPS, Take 2 capsules (2,000 mg total) by mouth 2 (two) times daily., Disp: , Rfl:  .  Naproxen Sodium (ALEVE PO), Take by mouth as needed. Reported on 02/07/2016, Disp: , Rfl:  .  Probiotic Product (PROBIOTIC DAILY PO), Take by mouth daily., Disp: , Rfl:  .  cephALEXin (KEFLEX) 500 MG capsule, Take 1 capsule (500 mg total) by mouth 3 (three) times daily., Disp: 15 capsule, Rfl: 0 .  triamcinolone cream (KENALOG) 0.1 %, Apply 1 application topically 2 (two) times daily., Disp: 30 g, Rfl: 0  EXAM:  Filed Vitals:   06/20/16 1542  BP: 100/62  Pulse: 68  Temp: 98.3 F (36.8 C)    There is no weight on file to calculate BMI.  GENERAL: vitals reviewed and listed above, alert, oriented, appears well hydrated and in no acute distress  HEENT: atraumatic, conjunttiva clear, no obvious abnormalities on inspection of external nose and ears  NECK: no obvious masses on inspection  SKIN: small erythematous papule/swelling on R index finger, no fluctuance, induration, drainage, blister - no joint swelling, normal ROM finger joints  MS: moves all extremities without noticeable abnormality  PSYCH: pleasant and cooperative, no obvious depression or anxiety  ASSESSMENT AND PLAN:  Discussed the following assessment and plan:  Skin lesion of hand  -looked like definite insect bite yesterday and query more reaction then infection - she is concerned for infection and going into weekend -steroid cream and delayed abx -return precuations -Patient advised to return or notify a doctor immediately if  symptoms worsen or persist or new concerns arise.  Patient Instructions  Try the stronger steroid cream 1-2 times daily  If any worsening redness start the antibiotic  Follow up if redness spreading or traveling, worsening, fevers, feeling sick, other concerns or if not resolving with treatment.    Colin Benton R., DO

## 2016-06-20 NOTE — Progress Notes (Signed)
Pre visit review using our clinic review tool, if applicable. No additional management support is needed unless otherwise documented below in the visit note. 

## 2016-06-20 NOTE — Patient Instructions (Signed)
Try the stronger steroid cream 1-2 times daily  If any worsening redness start the antibiotic  Follow up if redness spreading or traveling, worsening, fevers, feeling sick, other concerns or if not resolving with treatment.

## 2016-06-30 ENCOUNTER — Ambulatory Visit: Payer: Self-pay | Admitting: Family Medicine

## 2016-07-02 ENCOUNTER — Other Ambulatory Visit: Payer: Self-pay | Admitting: Nurse Practitioner

## 2016-07-02 DIAGNOSIS — Z1231 Encounter for screening mammogram for malignant neoplasm of breast: Secondary | ICD-10-CM

## 2016-07-17 ENCOUNTER — Ambulatory Visit
Admission: RE | Admit: 2016-07-17 | Discharge: 2016-07-17 | Disposition: A | Discharge status: Home / Self Care | Payer: 59 | Source: Ambulatory Visit | Attending: Nurse Practitioner | Admitting: Nurse Practitioner

## 2016-07-17 DIAGNOSIS — Z1231 Encounter for screening mammogram for malignant neoplasm of breast: Secondary | ICD-10-CM

## 2016-07-21 DIAGNOSIS — L821 Other seborrheic keratosis: Secondary | ICD-10-CM | POA: Diagnosis not present

## 2016-07-21 DIAGNOSIS — D489 Neoplasm of uncertain behavior, unspecified: Secondary | ICD-10-CM | POA: Diagnosis not present

## 2016-07-21 DIAGNOSIS — D225 Melanocytic nevi of trunk: Secondary | ICD-10-CM | POA: Diagnosis not present

## 2016-07-21 DIAGNOSIS — Z85828 Personal history of other malignant neoplasm of skin: Secondary | ICD-10-CM | POA: Diagnosis not present

## 2016-09-22 ENCOUNTER — Ambulatory Visit (INDEPENDENT_AMBULATORY_CARE_PROVIDER_SITE_OTHER): Payer: 59 | Admitting: Family Medicine

## 2016-09-22 DIAGNOSIS — Z2911 Encounter for prophylactic immunotherapy for respiratory syncytial virus (RSV): Secondary | ICD-10-CM

## 2016-09-22 DIAGNOSIS — Z23 Encounter for immunization: Secondary | ICD-10-CM | POA: Diagnosis not present

## 2016-10-07 ENCOUNTER — Telehealth: Payer: Self-pay | Admitting: Nurse Practitioner

## 2016-10-07 NOTE — Telephone Encounter (Signed)
Patient called and said, "I am going on a cruise soon and need some refills on my scolpolamine patch."  Pharmacy on file is correct.

## 2016-10-07 NOTE — Telephone Encounter (Signed)
Kem Boroughs, FNP okay to send in rx for Scopolamine 1.5 mg place 1 patch onto the skin every 3 days #4 (1 Muzzy) 0RF?

## 2016-10-07 NOTE — Telephone Encounter (Signed)
Yes OK to send RX for patch. Then may close encounter.

## 2016-10-08 MED ORDER — SCOPOLAMINE 1 MG/3DAYS TD PT72: 1.0000 | MEDICATED_PATCH | TRANSDERMAL | 0 refills | Status: DC

## 2016-10-08 MED FILL — SCOPOLAMINE 1 MG/3 DAY PATC: 1 | 12 days supply | Qty: 4 | Fill #0

## 2016-10-08 NOTE — Telephone Encounter (Signed)
Left detailed message at number provided [phone], advised rx for Scopolamine patches has been sent to her pharmacy on file. Advised to return call with any further questions or concerns.  Will close encounter.

## 2016-10-21 ENCOUNTER — Telehealth: Payer: Self-pay | Admitting: Family Medicine

## 2016-10-21 NOTE — Telephone Encounter (Signed)
Sorry meant to say shingles not pneumonia.

## 2016-10-21 NOTE — Telephone Encounter (Signed)
Cheyenne Myers, can you help her? Not sure what she is talking about. Thanks. Doesn't look like she had a pneumonia shot here.

## 2016-10-21 NOTE — Telephone Encounter (Signed)
Pt would like to know what she should do she just found out that the pnuemonia shot that she just received is not effective and she needs to get the new one that is a two series.  What should she do?

## 2016-10-22 NOTE — Telephone Encounter (Signed)
Left voicemail for patient to return out call

## 2016-10-23 NOTE — Telephone Encounter (Signed)
Spoke with patient. Patient is aware we are aware of new shingles vaccine and will be in communication once vaccines are received system-wide

## 2016-12-17 DIAGNOSIS — L821 Other seborrheic keratosis: Secondary | ICD-10-CM | POA: Diagnosis not present

## 2016-12-17 DIAGNOSIS — L720 Epidermal cyst: Secondary | ICD-10-CM | POA: Diagnosis not present

## 2016-12-17 DIAGNOSIS — Z23 Encounter for immunization: Secondary | ICD-10-CM | POA: Diagnosis not present

## 2017-01-12 ENCOUNTER — Encounter: Payer: Self-pay | Admitting: Nurse Practitioner

## 2017-01-12 ENCOUNTER — Ambulatory Visit (INDEPENDENT_AMBULATORY_CARE_PROVIDER_SITE_OTHER): Payer: 59 | Admitting: Nurse Practitioner

## 2017-01-12 VITALS — BP 100/66 | HR 60 | Ht 61.75 in | Wt 125.0 lb

## 2017-01-12 DIAGNOSIS — Z0000 Encounter for general adult medical examination without abnormal findings: Secondary | ICD-10-CM | POA: Diagnosis not present

## 2017-01-12 DIAGNOSIS — Z01419 Encounter for gynecological examination (general) (routine) without abnormal findings: Secondary | ICD-10-CM | POA: Diagnosis not present

## 2017-01-12 DIAGNOSIS — E559 Vitamin D deficiency, unspecified: Secondary | ICD-10-CM

## 2017-01-12 DIAGNOSIS — D126 Benign neoplasm of colon, unspecified: Secondary | ICD-10-CM | POA: Diagnosis not present

## 2017-01-12 DIAGNOSIS — Z13820 Encounter for screening for osteoporosis: Secondary | ICD-10-CM | POA: Diagnosis not present

## 2017-01-12 LAB — POCT URINALYSIS DIPSTICK
BILIRUBIN UA: NEGATIVE
GLUCOSE UA: NEGATIVE
KETONES UA: NEGATIVE
Leukocytes, UA: NEGATIVE
Nitrite, UA: NEGATIVE
Protein, UA: NEGATIVE
RBC UA: NEGATIVE
UROBILINOGEN UA: NEGATIVE
pH, UA: 5

## 2017-01-12 NOTE — Progress Notes (Signed)
Patient ID: Cheyenne Myers, female   DOB: [birth year], 61 y.o.   MRN: [id number]  61 y.o. [id number] Married  Caucasian Fe here for annual exam.  Still having some pain in cervical neck but does not to have surgery at this time.  OA of both knees.  Stress incontinence at times. Works from home and considering the pro and con's of retirement.  Patient's last menstrual period was 10/17/2009 (exact date).          Sexually active: Yes.    The current method of family planning is post menopausal status.    Exercising: Yes.    walking Smoker:  no  Health Maintenance: Pap: 01/11/16, Negative with neg HR HPV (history of CIN-II - 2011) MMG: 07/17/16, Bi-Rads 1: Negative Colonoscopy: 02/07/16, Tubular Adenoma, repeat in 7 years BMD: 01/17/15, T Score 0.6 Spine / -0.5 Right Femur Neck / -1.0 Left Femur Neck TDaP: 10/23/15 Shingles: 09/22/16 Pneumonia: Not indicated due to age Hep C and HIV: 01/11/16 Labs: PCP takes care of screening labs, we follow Vit D   Urine: Negative    reports that she has never smoked. She has never used smokeless tobacco. She reports that she does not drink alcohol or use drugs.  Past Medical History:  Diagnosis Date  . Abnormal Pap smear 06/2004   ASCUS, + HPV, CIN II LEEP  . CARPAL TUNNEL SYNDROME, BILATERAL 10/28/2010   Qualifier: Diagnosis of  By: Arnoldo Morale MD, Balinda Quails   . Cervical radicular pain    managed by [facility]  . Hx of adenomatous polyp of colon 02/13/2016  . Hyperlipemia   . MITRAL VALVE PROLAPSE 08/05/2007   Qualifier: Diagnosis of  By: Tiney Rouge CMA, Ellison Hughs    . URINARY TRACT INFECTION, RECURRENT 12/06/2007   Qualifier: Diagnosis of  By: Arnoldo Morale MD, Balinda Quails     Past Surgical History:  Procedure Laterality Date  . CARPAL TUNNEL RELEASE Right 08/2011  . CERVICAL BIOPSY  W/ LOOP ELECTRODE EXCISION  06/2004   CIN II  . COLPOSCOPY    . WISDOM TOOTH EXTRACTION  age 61    Current Outpatient Prescriptions  Medication Sig Dispense Refill  . acyclovir (ZOVIRAX) 800 MG tablet  TID X 5 DAYS PRN 28 tablet 2  . Cholecalciferol (VITAMIN D) 1000 UNITS capsule Take 1 capsule (1,000 Units total) by mouth 2 (two) times daily.    . Coenzyme Q10 (CO Q 10) 100 MG CAPS Take by mouth.    . Glucosamine 750 MG TABS Take 2 tablets by mouth daily.    Marland Kitchen KRILL OIL 1000 MG CAPS Take 2 capsules (2,000 mg total) by mouth 2 (two) times daily.    . Naproxen Sodium (ALEVE PO) Take by mouth as needed. Reported on 02/07/2016    . Probiotic Product (PROBIOTIC DAILY PO) Take by mouth daily.    Marland Kitchen scopolamine (TRANSDERM-SCOP, 1.5 MG,) 1 MG/3DAYS Place 1 patch (1.5 mg total) onto the skin every 3 (three) days. 4 patch 0   No current facility-administered medications for this visit.     Family History  Problem Relation Age of Onset  . Heart disease Mother   . Diabetes Mother   . Alzheimer's disease Mother   . Stroke Sister     tia and pacer  . Atrial fibrillation Sister   . Transient ischemic attack Sister     early alzhemeir  . Colon cancer Neg Hx     ROS:  Pertinent items are noted in HPI.  Otherwise, a comprehensive ROS  was negative.  Exam:   BP 100/66 (BP Location: Right Arm, Patient Position: Sitting, Cuff Size: Normal)   Pulse 60   Ht 5' 1.75" (1.568 m)   Wt 125 lb (56.7 kg)   LMP 10/17/2009 (Exact Date)   BMI 23.05 kg/m  Height: 5' 1.75" (156.8 cm) Ht Readings from Last 3 Encounters:  01/12/17 5' 1.75" (1.568 m)  06/20/16 5\' 1"  (1.549 m)  06/19/16 5\' 1"  (1.549 m)    General appearance: alert, cooperative and appears stated age Head: Normocephalic, without obvious abnormality, atraumatic Neck: no adenopathy, supple, symmetrical, trachea midline and thyroid normal to inspection and palpation Lungs: clear to auscultation bilaterally Breasts: normal appearance, no masses or tenderness Heart: regular rate and rhythm Abdomen: soft, non-tender; no masses,  no organomegaly Extremities: extremities normal, atraumatic, no cyanosis or edema Skin: Skin color, texture, turgor  normal. No rashes or lesions Lymph nodes: Cervical, supraclavicular, and axillary nodes normal. No abnormal inguinal nodes palpated Neurologic: Grossly normal   Pelvic: External genitalia:  no lesions              Urethra:  normal appearing urethra with no masses, tenderness or lesions              Bartholin's and Skene's: normal                 Vagina: normal appearing vagina with normal color and discharge, no lesions              Cervix: anteverted              Pap taken: No. Bimanual Exam:  Uterus:  normal size, contour, position, consistency, mobility, non-tender              Adnexa: no mass, fullness, tenderness               Rectovaginal: Confirms               Anus:  normal sphincter tone, no lesions  Chaperone present: yes  A:  Well Woman with normal exam  Postmenopausal no HRT Atrophic vaginitis with dyspareunia using OTC lubrication History of CIN II with LEEP 06/2004             Cervical neck pain - DDD with a BMD that is normal but decreased since last one.  History of tubular adenoma of the colon 01/2016    P:   Reviewed health and wellness pertinent to exam  Pap smear was not done  Will check Vit d and follow  Mammogram is due 06/2017 and order is placed for a repeat BMD  Counseled on breast self exam, mammography screening, adequate intake of calcium and vitamin D, diet and exercise, Kegel's exercises return annually or prn  An After Visit Summary was printed and given to the patient.

## 2017-01-12 NOTE — Patient Instructions (Signed)

## 2017-01-13 LAB — VITAMIN D 25 HYDROXY (VIT D DEFICIENCY, FRACTURES): Vit D, 25-Hydroxy: 40 ng/mL (ref 30–100)

## 2017-01-18 NOTE — Progress Notes (Signed)
Encounter reviewed by Dr. Brook Amundson C. Silva.  

## 2017-01-19 ENCOUNTER — Ambulatory Visit (INDEPENDENT_AMBULATORY_CARE_PROVIDER_SITE_OTHER)
Admission: RE | Admit: 2017-01-19 | Discharge: 2017-01-19 | Disposition: A | Discharge status: Home / Self Care | Payer: 59 | Source: Ambulatory Visit | Attending: Nurse Practitioner | Admitting: Nurse Practitioner

## 2017-01-19 DIAGNOSIS — Z13820 Encounter for screening for osteoporosis: Secondary | ICD-10-CM | POA: Diagnosis not present

## 2017-02-26 NOTE — Progress Notes (Signed)
HPI:  Here for CPE:  -Concerns and/or follow up today:  Sees Kem Boroughs for gyn exams. Sees dermatologist yearly for skin exam, but would like skin check today.  Hyperlipidemia: -had outside labs 12/2016, cholesterol much improved with LDL 105, HDL 66 -taking krill oil and coenzyme q10 -stopped coconut oil -eating healthy, exercising -wants recheck of cholesterol  Hyperglycemia: -Hgba1c 5.3on outside labs 12/2016 -asymptomatic  Neck Pain/CTS: -hx cervical radiculopathy -managed by [facility]   -Taking folic acid, vitamin D or calcium: taking vit d  -Diabetes and Dyslipidemia Screening: fasting  -Hx of HTN: no  -Vaccines: UTD  -pap history: sees gyn  -FDLMP: n/a  -sexual activity: yes, female partner, no new partners  -wants STI testing (Hep C if born 80[birth year]): no  -FH breast, colon or ovarian ca: see FH Last mammogram: sees gyn Last colon cancer screening: colonoscopy 2.2017 w/ polyp  Breast Ca Risk Assessment: -sees gyn  DEXA (>/= 65): -sees gyn  -Alcohol, Tobacco, drug use: see social history  Review of Systems - no fevers, unintentional weight loss, vision loss, hearing loss, chest pain, sob, hemoptysis, melena, hematochezia, hematuria, genital discharge, changing or concerning skin lesions, bleeding, bruising, loc, thoughts of self harm or SI  Past Medical History:  Diagnosis Date  . Abnormal Pap smear 06/2004   ASCUS, + HPV, CIN II LEEP  . CARPAL TUNNEL SYNDROME, BILATERAL 10/28/2010   Qualifier: Diagnosis of  By: Arnoldo Morale MD, Balinda Quails   . Cervical radicular pain    managed by [facility]  . Hx of adenomatous polyp of colon 02/13/2016  . Hyperlipemia   . MITRAL VALVE PROLAPSE 08/05/2007   Qualifier: Diagnosis of  By: Tiney Rouge CMA, Ellison Hughs    . URINARY TRACT INFECTION, RECURRENT 12/06/2007   Qualifier: Diagnosis of  By: Arnoldo Morale MD, Balinda Quails     Past Surgical History:  Procedure Laterality Date  . CARPAL TUNNEL RELEASE Right 08/2011  . CERVICAL BIOPSY  W/ LOOP  ELECTRODE EXCISION  06/2004   CIN II  . COLPOSCOPY    . WISDOM TOOTH EXTRACTION  age 71    Family History  Problem Relation Age of Onset  . Heart disease Mother   . Diabetes Mother   . Alzheimer's disease Mother   . Stroke Sister     tia and pacer  . Atrial fibrillation Sister   . Transient ischemic attack Sister     early alzhemeir  . Colon cancer Neg Hx     Social History   Social History  . Marital status: Married    Spouse name: N/A  . Number of children: N/A  . Years of education: N/A   Social History Main Topics  . Smoking status: Never Smoker  . Smokeless tobacco: Never Used  . Alcohol use No  . Drug use: No  . Sexual activity: Yes    Partners: Male    Birth control/ protection: Post-menopausal   Other Topics Concern  . 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  acyclovir (ZOVIRAX) 800 MG tablet, TID X 5 DAYS PRN, Disp: 28 tablet, Rfl: 2 .  Cholecalciferol (VITAMIN D) 1000 UNITS capsule, Take 1 capsule (1,000 Units total) by mouth 2 (two)  times daily., Disp: , Rfl:  .  Coenzyme Q10 (CO Q 10) 100 MG CAPS, Take by mouth., Disp: , Rfl:  .  Glucosamine 750 MG TABS, Take 2 tablets by mouth daily., Disp: , Rfl:  .  KRILL OIL 1000 MG CAPS, Take 2 capsules (2,000 mg total) by mouth 2 (two) times daily., Disp: , Rfl:  .  Naproxen Sodium (ALEVE PO), Take by mouth as needed. Reported on 02/07/2016, Disp: , Rfl:  .  Probiotic Product (PROBIOTIC DAILY PO), Take by mouth daily., Disp: , Rfl:  .  scopolamine (TRANSDERM-SCOP, 1.5 MG,) 1 MG/3DAYS, Place 1 patch (1.5 mg total) onto the skin every 3 (three) days., Disp: 4 patch, Rfl: 0  EXAM:  Vitals:   02/27/17 0808  BP: 122/82  Pulse: 67  Temp: 97.9 F (36.6 C)    GENERAL: vitals reviewed and listed  below, alert, oriented, appears well hydrated and in no acute distress  HEENT: head atraumatic, PERRLA, normal appearance of eyes, ears, nose and mouth. moist mucus membranes.  NECK: supple, no masses or lymphadenopathy  LUNGS: clear to auscultation bilaterally, no rales, rhonchi or wheeze  CV: HRRR, no peripheral edema or cyanosis, normal pedal pulses  ABDOMEN: bowel sounds normal, soft, non tender to palpation, no masses, no rebound or guarding  SKIN: no rash or abnormal lesions  GU/BREAST: declined  MS: normal gait, moves all extremities normally  NEURO: normal gait, speech and thought processing grossly intact, muscle tone grossly intact throughout  PSYCH: normal affect, pleasant and cooperative  ASSESSMENT AND PLAN:  Discussed the following assessment and plan:  Encounter for preventive health examination  Hyperlipidemia, unspecified hyperlipidemia type - Plan: Lipid Panel   -Discussed and advised all [place] preventive services health task force level A and B recommendations for age, sex and risks.  -Advised at least 150 minutes of exercise per week and a healthy diet with avoidance of (less then 1 serving per week) processed foods, white starches, red meat, fast foods and sweets and consisting of: * 5-9 servings of fresh fruits and vegetables (not corn or potatoes) *nuts and seeds, beans *olives and olive oil *lean meats such as fish and white chicken  *whole grains  -labs, studies and vaccines per orders this encounter  -she insisted on rechecking cholesterol  Orders Placed This Encounter  Procedures  . Lipid Panel    Patient advised to return to clinic immediately if symptoms worsen or persist or new concerns.  Patient Instructions  BEFORE YOU LEAVE: -labs -follow up: yearly for CPE  Vit D3 [phone] IU daily  Debrox ear drops for earwax  We have ordered labs or studies at this visit. It can take up to 1-2 weeks for results and processing. IF results  require follow up or explanation, we will call you with instructions. Clinically stable results will be released to your [facility]. If you have not heard from [place] or cannot find your results in [facility] in 2 weeks please contact our office at [phone].  If you are not yet signed up for [facility], please consider signing up.   We recommend the following healthy lifestyle for LIFE: 1) Small portions.   Tip: eat off of a salad plate instead of a dinner plate.  Tip: if you need more or a snack choose fruits, veggies and/or a handful of nuts or seeds.  2) Eat a healthy clean diet.  * Tip: Avoid (less then 1 serving per week): processed foods, sweets, sweetened drinks, white starches (rice, flour, bread,  potatoes, pasta, etc), red meat, fast foods, butter  *Tip: CHOOSE instead   * 5-9 servings per day of fresh or frozen fruits and vegetables (but not corn, potatoes, bananas, canned or dried fruit)   *nuts and seeds, beans   *olives and olive oil   *small portions of lean meats such as fish and white chicken    *small portions of whole grains  3)Get at least 150 minutes of sweaty aerobic exercise per week.  4)Reduce stress - consider counseling, meditation and relaxation to balance other aspects of your life.   WE NOW OFFER   [facility] FAST TRACK!!!  SAME DAY Appointments for ACUTE CARE  Such as: Sprains, Injuries, cuts, abrasions, rashes, muscle pain, joint pain, back pain Colds, flu, sore throats, headache, allergies, cough, fever  Ear pain, sinus and eye infections Abdominal pain, nausea, vomiting, diarrhea, upset stomach Animal/insect bites  3 Easy Ways to Schedule: Walk-In Scheduling Call in scheduling Mychart Sign-up: [email or web address]               No Follow-up on file.  Colin Benton R., DO

## 2017-02-27 ENCOUNTER — Encounter: Payer: Self-pay | Admitting: Family Medicine

## 2017-02-27 ENCOUNTER — Ambulatory Visit (INDEPENDENT_AMBULATORY_CARE_PROVIDER_SITE_OTHER): Payer: 59 | Admitting: Family Medicine

## 2017-02-27 VITALS — BP 122/82 | HR 67 | Temp 97.9°F | Ht 61.0 in | Wt 125.2 lb

## 2017-02-27 DIAGNOSIS — Z0000 Encounter for general adult medical examination without abnormal findings: Secondary | ICD-10-CM | POA: Diagnosis not present

## 2017-02-27 DIAGNOSIS — E785 Hyperlipidemia, unspecified: Secondary | ICD-10-CM | POA: Diagnosis not present

## 2017-02-27 LAB — LIPID PANEL
CHOL/HDL RATIO: 3
Cholesterol: 208 mg/dL — ABNORMAL HIGH (ref 0–200)
HDL: 59.8 mg/dL (ref >39.00)
LDL Cholesterol: 124 mg/dL — ABNORMAL HIGH (ref 0–99)
NONHDL: 147.89
Triglycerides: 117 mg/dL (ref 0.0–149.0)
VLDL: 23.4 mg/dL (ref 0.0–40.0)

## 2017-02-27 NOTE — Progress Notes (Signed)
Pre visit review using our clinic review tool, if applicable. No additional management support is needed unless otherwise documented below in the visit note. 

## 2017-02-27 NOTE — Patient Instructions (Signed)
BEFORE YOU LEAVE: -labs -follow up: yearly for CPE  Vit D3 [phone] IU daily  Debrox ear drops for earwax  We have ordered labs or studies at this visit. It can take up to 1-2 weeks for results and processing. IF results require follow up or explanation, we will call you with instructions. Clinically stable results will be released to your [facility]. If you have not heard from [place] or cannot find your results in [facility] in 2 weeks please contact our office at [phone].  If you are not yet signed up for [facility], please consider signing up.   We recommend the following healthy lifestyle for LIFE: 1) Small portions.   Tip: eat off of a salad plate instead of a dinner plate.  Tip: if you need more or a snack choose fruits, veggies and/or a handful of nuts or seeds.  2) Eat a healthy clean diet.  * Tip: Avoid (less then 1 serving per week): processed foods, sweets, sweetened drinks, white starches (rice, flour, bread, potatoes, pasta, etc), red meat, fast foods, butter  *Tip: CHOOSE instead   * 5-9 servings per day of fresh or frozen fruits and vegetables (but not corn, potatoes, bananas, canned or dried fruit)   *nuts and seeds, beans   *olives and olive oil   *small portions of lean meats such as fish and white chicken    *small portions of whole grains  3)Get at least 150 minutes of sweaty aerobic exercise per week.  4)Reduce stress - consider counseling, meditation and relaxation to balance other aspects of your life.   WE NOW OFFER   [facility] FAST TRACK!!!  SAME DAY Appointments for ACUTE CARE  Such as: Sprains, Injuries, cuts, abrasions, rashes, muscle pain, joint pain, back pain Colds, flu, sore throats, headache, allergies, cough, fever  Ear pain, sinus and eye infections Abdominal pain, nausea, vomiting, diarrhea, upset stomach Animal/insect bites  3 Easy Ways to Schedule: Walk-In Scheduling Call in scheduling Mychart Sign-up:  [email or web address]

## 2017-03-09 DIAGNOSIS — H35033 Hypertensive retinopathy, bilateral: Secondary | ICD-10-CM | POA: Diagnosis not present

## 2017-03-09 DIAGNOSIS — H2513 Age-related nuclear cataract, bilateral: Secondary | ICD-10-CM | POA: Diagnosis not present

## 2017-03-09 DIAGNOSIS — H25013 Cortical age-related cataract, bilateral: Secondary | ICD-10-CM | POA: Diagnosis not present

## 2017-03-09 DIAGNOSIS — H40013 Open angle with borderline findings, low risk, bilateral: Secondary | ICD-10-CM | POA: Diagnosis not present

## 2017-05-12 ENCOUNTER — Telehealth: Payer: Self-pay | Admitting: Family Medicine

## 2017-05-12 NOTE — Telephone Encounter (Signed)
Patient called in stating that her Lipid Panel was coded incorrectly for her CPE on 02/27/17.  States that it should be the same screening code as the other labs instead of the Hyperlipidemia.  Can you advise?

## 2017-05-12 NOTE — Telephone Encounter (Signed)
Would check with coders. To my understanding, should be associated with dx rather then preventive code and this usually results in better likelihood of insurance covering. I am ok with changing association if coders feel would be beneficial to pt. Thanks.

## 2017-05-14 NOTE — Telephone Encounter (Signed)
Hi Shannon,  The lipid panel was billed correctly with hyperlipidemia.  I cannot change the dx.  Thanks [employer]

## 2017-05-20 DIAGNOSIS — H40012 Open angle with borderline findings, low risk, left eye: Secondary | ICD-10-CM | POA: Diagnosis not present

## 2017-05-20 DIAGNOSIS — H40013 Open angle with borderline findings, low risk, bilateral: Secondary | ICD-10-CM | POA: Diagnosis not present

## 2017-05-20 DIAGNOSIS — H40053 Ocular hypertension, bilateral: Secondary | ICD-10-CM | POA: Diagnosis not present

## 2017-05-20 DIAGNOSIS — H01003 Unspecified blepharitis right eye, unspecified eyelid: Secondary | ICD-10-CM | POA: Diagnosis not present

## 2017-05-20 DIAGNOSIS — H179 Unspecified corneal scar and opacity: Secondary | ICD-10-CM | POA: Diagnosis not present

## 2017-05-20 DIAGNOSIS — H40011 Open angle with borderline findings, low risk, right eye: Secondary | ICD-10-CM | POA: Diagnosis not present

## 2017-06-03 ENCOUNTER — Ambulatory Visit (INDEPENDENT_AMBULATORY_CARE_PROVIDER_SITE_OTHER): Payer: BLUE CROSS/BLUE SHIELD | Admitting: Family Medicine

## 2017-06-03 ENCOUNTER — Encounter: Payer: Self-pay | Admitting: Family Medicine

## 2017-06-03 ENCOUNTER — Telehealth: Payer: Self-pay | Admitting: Family Medicine

## 2017-06-03 VITALS — BP 122/76 | HR 89 | Temp 98.5°F | Ht 62.0 in | Wt 122.0 lb

## 2017-06-03 DIAGNOSIS — B9689 Other specified bacterial agents as the cause of diseases classified elsewhere: Secondary | ICD-10-CM | POA: Diagnosis not present

## 2017-06-03 DIAGNOSIS — J329 Chronic sinusitis, unspecified: Secondary | ICD-10-CM | POA: Diagnosis not present

## 2017-06-03 MED ORDER — AMOXICILLIN-POT CLAVULANATE 875-125 MG PO TABS: 1.0000 | ORAL_TABLET | Freq: Two times a day (BID) | ORAL | 0 refills | Status: DC

## 2017-06-03 MED FILL — AMOX TR-K CLV 875-125 MG TA: 875-125 | 7 days supply | Qty: 14 | Fill #0

## 2017-06-03 NOTE — Telephone Encounter (Signed)
Yes thanks, saw patient today.

## 2017-06-03 NOTE — Progress Notes (Signed)
Phone: [phone]  Subjective:  Patient presents today to establish care with me as their new primary care provider. Also for acute issue sinusitis concern. Patient was formerly a patient of Dr. Maudie Mercury. Chief complaint-noted.   Cheyenne Myers is a 61 y.o. year old very pleasant female patient who presents with sinusitis symptoms including nasal congestion, sinus tenderness -other symptoms include: yellow discharge from the nose -day of illness:12 -Symptoms have improved then worsened -previous treatments: mucinex, sinus rinses -sick contacts/travel/risks: denies flu exposure.  -Hx of: allergies- no  ROS- no fever, chills, nausea, vomiting. Cough sinus pressure noted. Yellow discharge from nose. No dental pain. No shortness of breath  The following were reviewed and entered/updated in epic: Past Medical History:  Diagnosis Date  . Abnormal Pap smear 06/2004   ASCUS, + HPV, CIN II LEEP  . CARPAL TUNNEL SYNDROME, BILATERAL 10/28/2010   Qualifier: Diagnosis of  By: Arnoldo Morale MD, Balinda Quails   . Cervical radicular pain    managed by [facility]  . Hx of adenomatous polyp of colon 02/13/2016  . Hyperlipemia   . MITRAL VALVE PROLAPSE 08/05/2007   Qualifier: Diagnosis of  By: Tiney Rouge CMA, Ellison Hughs    . URINARY TRACT INFECTION, RECURRENT 12/06/2007   Qualifier: Diagnosis of  By: Arnoldo Morale MD, Balinda Quails    Patient Active Problem List   Diagnosis Date Noted  . Hyperglycemia 02/21/2016  . Hx of adenomatous polyp of colon 02/13/2016  . Cervical radiculopathy - managed by neurosurgeon 07/18/2014  . H/O cold sores 07/18/2014  . HYPERLIPIDEMIA 08/05/2007   Past Surgical History:  Procedure Laterality Date  . CARPAL TUNNEL RELEASE Right 08/2011  . CERVICAL BIOPSY  W/ LOOP ELECTRODE EXCISION  06/2004   CIN II  . COLPOSCOPY    . WISDOM TOOTH EXTRACTION  age 24    Family History  Problem Relation Age of Onset  . Heart disease Mother   . Diabetes Mother   . Alzheimer's disease Mother   . Stroke Sister        tia  and pacer  . Atrial fibrillation Sister   . Transient ischemic attack Sister        early alzhemeir  . Colon cancer Neg Hx     Medications- reviewed and updated Current Outpatient Prescriptions  Medication Sig Dispense Refill  . acyclovir (ZOVIRAX) 800 MG tablet TID X 5 DAYS PRN 28 tablet 2  . Cholecalciferol (VITAMIN D) 1000 UNITS capsule Take 1 capsule (1,000 Units total) by mouth 2 (two) times daily.    . Coenzyme Q10 (CO Q 10) 100 MG CAPS Take by mouth.    . Glucosamine 750 MG TABS Take 2 tablets by mouth daily.    Marland Kitchen KRILL OIL 1000 MG CAPS Take 2 capsules (2,000 mg total) by mouth 2 (two) times daily.    . Probiotic Product (PROBIOTIC DAILY PO) Take by mouth daily.    Marland Kitchen scopolamine (TRANSDERM-SCOP, 1.5 MG,) 1 MG/3DAYS Place 1 patch (1.5 mg total) onto the skin every 3 (three) days. 4 patch 0   Allergies-reviewed and updated No Known Allergies  Social History   Social History  . Marital status: Married    Spouse name: N/A  . Number of children: N/A  . Years of education: N/A   Social History Main Topics  . Smoking status: Never Smoker  . Smokeless tobacco: Never Used  . Alcohol use No  . Drug use: No  . Sexual activity: Yes    Partners: Male    Birth control/  protection: Post-menopausal   Other Topics Concern  . None   Social History Narrative   Married. 2 children. 2 grandkids 5 and 1.5 in 05/2017.       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      Lifestyle: regular walking, healthy diet   Hobbies: travel, camping, hike, bike, church- Christian faith       Objective: BP 122/76 (BP Location: Left Arm, Patient Position: Sitting, Cuff Size: Normal)   Pulse 89   Temp 98.5 F (36.9 C) (Oral)   Ht 5\' 2"  (1.575 m)   Wt 122 lb (55.3 kg)   LMP 10/17/2009 (Exact Date)   SpO2 96%   BMI 22.31 kg/m  Gen: NAD, resting comfortably HEENT: Turbinates erythematous with yellow drainage, TM normal, pharynx mildly erythematous with no  tonsilar exudate or edema, frontal sinus tenderness Neck: no thyromegaly CV: RRR no murmurs rubs or gallops Lungs: CTAB no crackles, wheeze, rhonchi Abdomen: soft/nontender/nondistended/normal bowel sounds. No rebound or guarding.  Ext: no edema Skin: warm, dry Neuro: grossly normal, moves all extremities, PERRLA  Assessment/Plan:  Sinsusitis Bacterial based on: Symptoms >10 days, double sickening, or severe symptoms in first 3 days  Treatment: -considered steroid: we opted out for now (prednisone) -other symptomatic care with mucinex and daily sinus rinse -Antibiotic indicated: yes  Finally, we reviewed reasons to return to care including if symptoms worsen or persist or new concerns arise (particularly fever or shortness of breath)  Meds ordered this encounter  Medications  . amoxicillin-clavulanate (AUGMENTIN) 875-125 MG tablet    Sig: Take 1 tablet by mouth 2 (two) times daily.    Dispense:  14 tablet    Refill:  0    Garret Reddish, MD

## 2017-06-03 NOTE — Patient Instructions (Signed)
Sinsusitis Bacterial based on: Symptoms >10 days, double sickening, or severe symptoms in first 3 days  Treatment: -considered steroid: we opted out for now (prednisone) -other symptomatic care with mucinex and daily sinus rinse -Antibiotic indicated: yes  Finally, we reviewed reasons to return to care including if symptoms worsen or persist or new concerns arise (particularly fever or shortness of breath)  Meds ordered this encounter  Medications  . amoxicillin-clavulanate (AUGMENTIN) 875-125 MG tablet    Sig: Take 1 tablet by mouth 2 (two) times daily.    Dispense:  14 tablet    Refill:  0

## 2017-06-03 NOTE — Telephone Encounter (Signed)
Patient is requesting to transfer providers from Dr. Maudie Mercury to Dr. Yong Channel.  Please advise if you accept this request.

## 2017-06-05 NOTE — Telephone Encounter (Signed)
Ok with me 

## 2017-06-10 ENCOUNTER — Other Ambulatory Visit: Payer: Self-pay | Admitting: Family Medicine

## 2017-06-10 DIAGNOSIS — Z1231 Encounter for screening mammogram for malignant neoplasm of breast: Secondary | ICD-10-CM

## 2017-07-29 DIAGNOSIS — L237 Allergic contact dermatitis due to plants, except food: Secondary | ICD-10-CM | POA: Diagnosis not present

## 2017-07-29 DIAGNOSIS — D2272 Melanocytic nevi of left lower limb, including hip: Secondary | ICD-10-CM | POA: Diagnosis not present

## 2017-07-29 DIAGNOSIS — L570 Actinic keratosis: Secondary | ICD-10-CM | POA: Diagnosis not present

## 2017-07-29 DIAGNOSIS — Z85828 Personal history of other malignant neoplasm of skin: Secondary | ICD-10-CM | POA: Diagnosis not present

## 2017-07-29 DIAGNOSIS — L738 Other specified follicular disorders: Secondary | ICD-10-CM | POA: Diagnosis not present

## 2017-07-31 ENCOUNTER — Ambulatory Visit
Admission: RE | Admit: 2017-07-31 | Discharge: 2017-07-31 | Disposition: A | Discharge status: Home / Self Care | Payer: BLUE CROSS/BLUE SHIELD | Source: Ambulatory Visit | Attending: Family Medicine | Admitting: Family Medicine

## 2017-07-31 DIAGNOSIS — Z1231 Encounter for screening mammogram for malignant neoplasm of breast: Secondary | ICD-10-CM

## 2017-08-10 ENCOUNTER — Ambulatory Visit: Payer: Self-pay

## 2017-08-17 DIAGNOSIS — H40052 Ocular hypertension, left eye: Secondary | ICD-10-CM | POA: Diagnosis not present

## 2017-08-17 DIAGNOSIS — H01003 Unspecified blepharitis right eye, unspecified eyelid: Secondary | ICD-10-CM | POA: Diagnosis not present

## 2017-08-17 DIAGNOSIS — H1013 Acute atopic conjunctivitis, bilateral: Secondary | ICD-10-CM | POA: Diagnosis not present

## 2017-08-17 DIAGNOSIS — H40013 Open angle with borderline findings, low risk, bilateral: Secondary | ICD-10-CM | POA: Diagnosis not present

## 2017-09-10 ENCOUNTER — Encounter: Payer: Self-pay | Admitting: Family Medicine

## 2017-09-29 ENCOUNTER — Ambulatory Visit (INDEPENDENT_AMBULATORY_CARE_PROVIDER_SITE_OTHER): Payer: BLUE CROSS/BLUE SHIELD | Admitting: *Deleted

## 2017-09-29 DIAGNOSIS — Z23 Encounter for immunization: Secondary | ICD-10-CM | POA: Diagnosis not present

## 2017-10-07 DIAGNOSIS — H5310 Unspecified subjective visual disturbances: Secondary | ICD-10-CM | POA: Diagnosis not present

## 2017-10-07 DIAGNOSIS — R51 Headache: Secondary | ICD-10-CM | POA: Diagnosis not present

## 2017-10-07 DIAGNOSIS — H40013 Open angle with borderline findings, low risk, bilateral: Secondary | ICD-10-CM | POA: Diagnosis not present

## 2017-10-07 DIAGNOSIS — H1013 Acute atopic conjunctivitis, bilateral: Secondary | ICD-10-CM | POA: Diagnosis not present

## 2017-12-30 LAB — LIPID PANEL
Cholesterol: 206 — AB (ref 0–200)
HDL: 63 (ref 35–70)
LDL Cholesterol: 123
TRIGLYCERIDES: 102 (ref 40–160)

## 2017-12-30 LAB — BASIC METABOLIC PANEL
BUN: 18 (ref 4–21)
Creatinine: 0.7 (ref 0.5–1.1)
GLUCOSE: 93
POTASSIUM: 4.3 (ref 3.4–5.3)
SODIUM: 143 (ref 137–147)

## 2017-12-30 LAB — TSH: TSH: 1.19 (ref 0.41–5.90)

## 2017-12-30 LAB — CBC AND DIFFERENTIAL
HCT: 40 (ref 36–46)
Hemoglobin: 13.4 (ref 12.0–16.0)
Platelets: 265 (ref 150–399)
WBC: 5.2

## 2017-12-30 LAB — VITAMIN B12: Vitamin B-12: 772

## 2017-12-30 LAB — HEMOGLOBIN A1C: Hemoglobin A1C: 5.4

## 2017-12-31 ENCOUNTER — Encounter: Payer: Self-pay | Admitting: Family Medicine

## 2018-01-04 DIAGNOSIS — H179 Unspecified corneal scar and opacity: Secondary | ICD-10-CM | POA: Diagnosis not present

## 2018-01-04 DIAGNOSIS — H01003 Unspecified blepharitis right eye, unspecified eyelid: Secondary | ICD-10-CM | POA: Diagnosis not present

## 2018-01-04 DIAGNOSIS — H40013 Open angle with borderline findings, low risk, bilateral: Secondary | ICD-10-CM | POA: Diagnosis not present

## 2018-01-04 DIAGNOSIS — H1013 Acute atopic conjunctivitis, bilateral: Secondary | ICD-10-CM | POA: Diagnosis not present

## 2018-01-13 ENCOUNTER — Encounter: Payer: Self-pay | Admitting: Certified Nurse Midwife

## 2018-01-13 ENCOUNTER — Ambulatory Visit (INDEPENDENT_AMBULATORY_CARE_PROVIDER_SITE_OTHER): Payer: BLUE CROSS/BLUE SHIELD | Admitting: Certified Nurse Midwife

## 2018-01-13 ENCOUNTER — Ambulatory Visit: Payer: 59 | Admitting: Nurse Practitioner

## 2018-01-13 ENCOUNTER — Other Ambulatory Visit: Payer: Self-pay

## 2018-01-13 VITALS — BP 136/80 | HR 72 | Resp 16 | Ht 61.0 in | Wt 117.8 lb

## 2018-01-13 DIAGNOSIS — N951 Menopausal and female climacteric states: Secondary | ICD-10-CM

## 2018-01-13 DIAGNOSIS — N898 Other specified noninflammatory disorders of vagina: Secondary | ICD-10-CM | POA: Diagnosis not present

## 2018-01-13 DIAGNOSIS — Z8739 Personal history of other diseases of the musculoskeletal system and connective tissue: Secondary | ICD-10-CM | POA: Diagnosis not present

## 2018-01-13 DIAGNOSIS — Z01419 Encounter for gynecological examination (general) (routine) without abnormal findings: Secondary | ICD-10-CM

## 2018-01-13 NOTE — Progress Notes (Addendum)
62 y.o. G75P2002 Married  Caucasian Fe here for annual exam. Menopausal no HRT, denies vaginal bleeding or vaginal dryness. Has noted GI discomfort in the past 24 hours and small amount of diarrhea with blood noted, history of hemorrhoid. She had bowel movement today with no blood noted or black tarry stools. Feels better. Keeps grandchild after school and feels it might have been a virus. Patient sees Dr.Hunter PCP yearly with labs and management of Vitamin D and Zovirax. No health issues today.  Patient's last menstrual period was 10/17/2009 (exact date).          Sexually active: Yes.    The current method of family planning is post menopausal status.    Exercising: Yes.    walking Smoker:  no  Health Maintenance: Pap:  01-11-16 neg HPV HR neg History of Abnormal Pap: yes, years ago per patient MMG: 07-31-17 category c density birads 1:neg Self Breast exams: no Colonoscopy:  02/07/16 polyp removed; f/u 5 years BMD:   2018 - Low Bone mass, osteopenia TDaP:  2016 Shingles: 2017 Pneumonia: none Hep C and HIV: both neg 2017 Labs: PCP   reports that  has never smoked. she has never used smokeless tobacco. She reports that she does not drink alcohol or use drugs.  Past Medical History:  Diagnosis Date  . Abnormal Pap smear 06/2004   ASCUS, + HPV, CIN II LEEP  . Cervical radicular pain    managed by [facility]  . Hx of adenomatous polyp of colon 02/13/2016  . Hyperlipemia   . MITRAL VALVE PROLAPSE 08/05/2007   Qualifier: Diagnosis of  By: Tiney Rouge CMA, Ellison Hughs    . URINARY TRACT INFECTION, RECURRENT 12/06/2007   years ago.     Past Surgical History:  Procedure Laterality Date  . CARPAL TUNNEL RELEASE Right 08/2011  . CERVICAL BIOPSY  W/ LOOP ELECTRODE EXCISION  06/2004   CIN II  . COLPOSCOPY    . WISDOM TOOTH EXTRACTION  age 17    Current Outpatient Medications  Medication Sig Dispense Refill  . acyclovir (ZOVIRAX) 800 MG tablet TID X 5 DAYS PRN 28 tablet 2  . Cholecalciferol (VITAMIN D)  1000 UNITS capsule Take 1 capsule (1,000 Units total) by mouth 2 (two) times daily.    . Coenzyme Q10 (CO Q 10) 100 MG CAPS Take by mouth.    . Glucosamine 750 MG TABS Take 2 tablets by mouth daily.    Marland Kitchen KRILL OIL 1000 MG CAPS Take 2 capsules (2,000 mg total) by mouth 2 (two) times daily.    . Probiotic Product (PROBIOTIC DAILY PO) Take by mouth daily.    Marland Kitchen scopolamine (TRANSDERM-SCOP, 1.5 MG,) 1 MG/3DAYS Place 1 patch (1.5 mg total) onto the skin every 3 (three) days. 4 patch 0   No current facility-administered medications for this visit.     Family History  Problem Relation Age of Onset  . Heart disease Mother   . Diabetes Mother   . Alzheimer's disease Mother   . Stroke Sister        tia and pacer  . Atrial fibrillation Sister   . Dementia Sister        early  . Transient ischemic attack Sister   . Dementia Sister        severe emotional disturbance  . Diabetes Maternal Grandmother   . Breast cancer Maternal Grandmother   . Diabetes Maternal Grandfather   . Colon cancer Neg Hx     ROS:  Pertinent items are  noted in HPI.  Otherwise, a comprehensive ROS was negative.  Exam:   BP 136/80 (BP Location: Right Arm, Patient Position: Sitting, Cuff Size: Normal)   Pulse 72   Resp 16   Ht 5\' 1"  (1.549 m)   Wt 117 lb 12.8 oz (53.4 kg)   LMP 10/17/2009 (Exact Date)   BMI 22.26 kg/m  Height: 5\' 1"  (154.9 cm) Ht Readings from Last 3 Encounters:  01/13/18 5\' 1"  (1.549 m)  06/03/17 5\' 2"  (1.575 m)  02/27/17 5\' 1"  (1.549 m)    General appearance: alert, cooperative and appears stated age Head: Normocephalic, without obvious abnormality, atraumatic Neck: no adenopathy, supple, symmetrical, trachea midline and thyroid normal to inspection and palpation Lungs: clear to auscultation bilaterally Breasts: normal appearance, no masses or tenderness, No nipple retraction or dimpling, No nipple discharge or bleeding, No axillary or supraclavicular adenopathy Heart: regular rate and  rhythm Abdomen: soft, non-tender; no masses,  no organomegaly Extremities: extremities normal, atraumatic, no cyanosis or edema Skin: Skin color, texture, turgor normal. No rashes or lesions Lymph nodes: Cervical, supraclavicular, and axillary nodes normal. No abnormal inguinal nodes palpated Neurologic: Grossly normal   Pelvic: External genitalia:  no lesions              Urethra:  normal appearing urethra with no masses, tenderness or lesions              Bartholin's and Skene's: normal                 Vagina: normal appearing vagina with normal color and discharge, no lesions              Cervix: multiparous appearance, no cervical motion tenderness and no lesions              Pap taken: No. Bimanual Exam:  Uterus:  normal size, contour, position, consistency, mobility, non-tender              Adnexa: normal adnexa and no mass, fullness, tenderness               Rectovaginal: Confirms               Anus:  normal sphincter tone, no lesions  Chaperone present: yes  A:  Well Woman with normal exam  Menopausal no HRT  Vaginal dryness  History of HSV 1,cholesterol elevation with PCP management  Vitamin D deficiency with MD management  Osteopenia history, obtain results    P:   Reviewed health and wellness pertinent to exam  Aware of need to evaluate if vaginal bleeding  Discussed vaginal dryness finding and discussed options for treatment,patient does not want to use estrogen. Discussed OTC products options, instructions given for coconut oil use. Will advise if no change.  Follow up with MD as indicated.  Discussed importance of calcium 4 servings in diet and regular weight bearing exercise and vitamin D. Questions addressed.  Pap smear: no   counseled on breast self exam, mammography screening, feminine hygiene, , adequate intake of calcium and vitamin D, diet and exercise  return annually or prn  An After Visit Summary was printed and given to the patient.

## 2018-01-13 NOTE — Patient Instructions (Signed)

## 2018-01-15 ENCOUNTER — Encounter: Payer: Self-pay | Admitting: Certified Nurse Midwife

## 2018-01-15 ENCOUNTER — Telehealth: Payer: Self-pay | Admitting: Certified Nurse Midwife

## 2018-01-15 NOTE — Telephone Encounter (Signed)
yes

## 2018-01-15 NOTE — Telephone Encounter (Addendum)
01/19/2017  Results:  Lumbar spine (L1-L4) Femoral neck (FN)  T-score +0.4  RFN:-0.7 LFN:-1.1   Change in BMD from previous DXA test (%) -1.5%  -2.2%    01/24/2015  Results:  Lumbar spine (L1-L4) Femoral neck (FN)  T-score  + 0.6 RFN: - 0.5 LFN: - 1.0  Change in BMD from previous DXA test (%)  - 7.6%  - 9.2*     Melvia Heaps CNM okay to advise patient T score of +0.4 in lumbar spine, change of -1.5% and RFN -0.7, LFN -1.1 with -2.2% change. Normal results. Osteopenia in left femur.Continue calcium and Vit D along with exercise and walking and upper body weight.

## 2018-01-15 NOTE — Telephone Encounter (Signed)
Patient sent the following correspondence through [facility]. Routing to triage to assist patient with request.  ----- Message from Krebs, Generic sent at 01/15/2018 8:11 AM EST -----    Hello Cheyenne Myers,    Did you find my 2018 T-scores?  I could only find the change from my past bone density to the one I had in 2018.  Thanks  Cheyenne Myers

## 2018-01-15 NOTE — Telephone Encounter (Signed)
Spoke with patient. Advised of results. Patient has concerns about the mention of her having osteoporosis at her office visit and this being in her chart. States she has never had osteoporosis and based on current results does not want this in her chart if not diagnosed. Advised results are normal. Left femur is -1.1 which falls barely into osteopenia range. Advised will review with Melvia Heaps CNM and return call.

## 2018-01-18 NOTE — Telephone Encounter (Signed)
Chart updated 01/18/2018 8:39 AM    To: Gustavus Messing Reddinger    From: Gwendlyn Deutscher, RN    Created: 01/18/2018 8:39 AM     Abigail Butts,  I wanted to let you know that your chart has been updated to reflect your most recent bone density test results. If you need anything please let us know.  Sincerely,  Reesa Chew, RN     Encounter closed.

## 2018-01-18 NOTE — Telephone Encounter (Signed)
Her chart is updated to reflect current osteopenia finding

## 2018-04-21 ENCOUNTER — Encounter: Payer: Self-pay | Admitting: Family Medicine

## 2018-04-21 ENCOUNTER — Ambulatory Visit (INDEPENDENT_AMBULATORY_CARE_PROVIDER_SITE_OTHER): Payer: BLUE CROSS/BLUE SHIELD | Admitting: Family Medicine

## 2018-04-21 VITALS — BP 118/84 | HR 75 | Temp 98.1°F | Ht 61.0 in | Wt 116.0 lb

## 2018-04-21 DIAGNOSIS — Z0000 Encounter for general adult medical examination without abnormal findings: Secondary | ICD-10-CM

## 2018-04-21 DIAGNOSIS — Z23 Encounter for immunization: Secondary | ICD-10-CM

## 2018-04-21 MED ORDER — ACYCLOVIR 800 MG PO TABS: ORAL_TABLET | ORAL | 2 refills | Status: DC

## 2018-04-21 MED FILL — ACYCLOVIR 800 MG TABLET: 800 | 9 days supply | Qty: 28 | Fill #0

## 2018-04-21 NOTE — Patient Instructions (Addendum)
Shingrix #1 today (after you check with insurance). Repeat injection in 2-3 months. Schedule this before you leave.   No changes today  Please drop off a copy of your labs when you can   Could also ask [employer] the cost for CBC, CMP, lipid panel under E78.5

## 2018-04-21 NOTE — Progress Notes (Signed)
Phone: [phone]  Subjective:  Patient presents today for their annual physical. Chief complaint-noted.   See problem oriented charting- ROS- full  review of systems was completed and negative including No chest pain or shortness of breath. No headache or blurry vision.   The following were reviewed and entered/updated in epic: Past Medical History:  Diagnosis Date  . Abnormal Pap smear 06/2004   ASCUS, + HPV, CIN II LEEP  . Cervical radicular pain    managed by [facility]  . Hx of adenomatous polyp of colon 02/13/2016  . Hyperlipemia   . MITRAL VALVE PROLAPSE 08/05/2007   Qualifier: Diagnosis of  By: Tiney Rouge CMA, Ellison Hughs    . URINARY TRACT INFECTION, RECURRENT 12/06/2007   years ago.    Patient Active Problem List   Diagnosis Date Noted  . Cervical radiculopathy - managed by neurosurgeon 07/18/2014    Priority: Medium  . Hyperlipidemia 08/05/2007    Priority: Medium  . Hx of adenomatous polyp of colon 02/13/2016    Priority: Low  . H/O cold sores 07/18/2014    Priority: Low   Past Surgical History:  Procedure Laterality Date  . CARPAL TUNNEL RELEASE Right 08/2011  . CERVICAL BIOPSY  W/ LOOP ELECTRODE EXCISION  06/2004   CIN II  . COLPOSCOPY    . WISDOM TOOTH EXTRACTION  age 83    Family History  Problem Relation Age of Onset  . Heart disease Mother   . Diabetes Mother   . Alzheimer's disease Mother   . Stroke Sister        tia and pacer  . Atrial fibrillation Sister   . Dementia Sister        early  . Transient ischemic attack Sister   . Dementia Sister        severe emotional disturbance  . Heart attack Father        age 58 died- but was smoker.   . Diabetes Maternal Grandmother   . Breast cancer Maternal Grandmother   . Diabetes Maternal Grandfather   . Colon cancer Sister        32    Medications- reviewed and updated Current Outpatient Medications  Medication Sig Dispense Refill  . acyclovir (ZOVIRAX) 800 MG tablet TID X 5 DAYS PRN 28 tablet 2  .  Cholecalciferol (VITAMIN D) 1000 UNITS capsule Take 1 capsule (1,000 Units total) by mouth 2 (two) times daily.    . Coenzyme Q10 (CO Q 10) 100 MG CAPS Take by mouth.    . Glucosamine 750 MG TABS Take 2 tablets by mouth daily.    Marland Kitchen KRILL OIL 1000 MG CAPS Take 2 capsules (2,000 mg total) by mouth 2 (two) times daily.    . Probiotic Product (PROBIOTIC DAILY PO) Take by mouth daily.    Marland Kitchen scopolamine (TRANSDERM-SCOP, 1.5 MG,) 1 MG/3DAYS Place 1 patch (1.5 mg total) onto the skin every 3 (three) days. 4 patch 0   No current facility-administered medications for this visit.     Allergies-reviewed and updated No Known Allergies  Social History   Social History Narrative   Married. 2 children. 2 grandkids 5 and 1.5 in 05/2017.       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 Oropharynx normal Neck: no thyromegaly CV: RRR no murmurs rubs or gallops Lungs: CTAB no crackles, wheeze, rhonchi Abdomen: soft/nontender/nondistended/normal bowel sounds. No rebound or guarding.  Ext: no edema Skin: warm, dry Neuro: grossly normal, moves all extremities, PERRLA  Assessment/Plan:  62 y.o. female presenting for annual physical.  Health Maintenance counseling: 1. Anticipatory guidance: Patient counseled regarding regular dental exams -q6 months, eye exams - suspected preglaucoma so sees regularly, wearing seatbelts.  2. Risk factor reduction:  Advised patient of need for regular exercise and diet rich and fruits and vegetables to reduce risk of heart  attack and stroke. Exercise- walks regularly and does weights. Diet-healthy diet.  Wt Readings from Last 3 Encounters:  04/21/18 116 lb (52.6 kg)  01/13/18 117 lb 12.8 oz (53.4 kg)  06/03/17 122 lb (55.3 kg)  3. Immunizations/screenings/ancillary studies-we discussed Shingrix- opts in  Immunization History  Administered Date(s) Administered  . Influenza Split 10/14/2012  . Influenza Whole 10/26/2009  . Influenza,inj,Quad PF,6+ Mos 08/29/2013, 09/29/2017  . Influenza-Unspecified 09/21/2014, 09/05/2015, 09/02/2016  . Td 11/21/2006  . Tdap 10/23/2015  . Zoster 09/22/2016  . Zoster Recombinat (Shingrix) 04/21/2018  4. Cervical cancer screening- follows with [facility] just retired seeing new provider. Pap 2017 so will be due 2020 likely.  5. Breast cancer screening-  breast exam with gynecology and mammogram July 31, 2017 6. Colon cancer screening -February 07, 2016 with 7-year repeat due to small adenoma. We did send a message to Dr. Carlean Purl today as she found out has half sister that died of colon cancer at age 81- to see if we should move to 5 years.  7. Skin cancer screening-sees dermatology yearly- Dr. Delman Cheadle.  Advised regular sunscreen use. Denies worrisome, changing, or new skin lesions.  8. Birth control/STD check- postmenopausal/monogamous 9. Osteoporosis screening - had 01/19/17. Worst t score of -1.1. Through GYN  Status of chronic or acute concerns   Mild hyperlipidemia- takes Krill oil.  BMI pretty ideal.  History of cervical radiculopathy-managed by neurosurgery in the past. Has done way better since getting off cpu  Cold sores-intermittent and likes to have acyclovir on hand  Small area on back- possible small lipoma vs. Cyst. She is going to discuss with dermatology potential cost of removal as it has been growing- and she has high deductible plan.    Future Appointments  Date Time Provider [place]  07/02/2018  9:00 AM [facility] NURSE [facility]    01/19/2019  2:45 PM Regina Eck, CNM [place] None  04/25/2019  8:15 AM Marin Olp, MD [facility]   Return in about 1 year (around 04/22/2019) for physical.  Lab/Order associations: Need for prophylactic vaccination and inoculation against varicella - Plan: Varicella-zoster vaccine IM (Shingrix)  Meds ordered this encounter  Medications  . acyclovir (ZOVIRAX) 800 MG tablet    Sig: TID X 5 DAYS PRN    Dispense:  28 tablet    Refill:  2   Return precautions advised.  Garret Reddish, MD

## 2018-04-22 ENCOUNTER — Telehealth: Payer: Self-pay

## 2018-04-22 ENCOUNTER — Encounter: Payer: Self-pay | Admitting: Family Medicine

## 2018-04-22 MED ORDER — ACYCLOVIR 800 MG PO TABS: ORAL_TABLET | ORAL | 2 refills | Status: DC

## 2018-04-22 NOTE — Telephone Encounter (Signed)
-----   Message from [name], MD sent at 04/22/2018  3:16 PM EDT ----- Regarding: Patient that needs recall change This is the patient that needs colon recall changed to 2022 due to half sister w./ colon cancer   Please let her know we made the change

## 2018-04-22 NOTE — Addendum Note (Signed)
Addended by: Everrett Coombe on: 04/22/2018 03:30 PM   Modules accepted: Orders

## 2018-04-22 NOTE — Telephone Encounter (Signed)
Lorrain informed and colon recall changed in the system. She said thank you for the concern and call.

## 2018-04-22 NOTE — Progress Notes (Signed)
I will change recall to 2022 (5 years) - reasonable - gray area

## 2018-05-04 ENCOUNTER — Encounter: Payer: Self-pay | Admitting: Family Medicine

## 2018-05-10 ENCOUNTER — Encounter: Payer: Self-pay | Admitting: Family Medicine

## 2018-05-11 ENCOUNTER — Other Ambulatory Visit: Payer: Self-pay

## 2018-05-11 MED ORDER — DIAZEPAM 5 MG PO TABS: ORAL_TABLET | ORAL | 0 refills | Status: DC

## 2018-05-11 MED FILL — diazePAM 5 MG TABS: 5 | 1 days supply | Qty: 1 | Fill #0

## 2018-06-10 DIAGNOSIS — H25013 Cortical age-related cataract, bilateral: Secondary | ICD-10-CM | POA: Diagnosis not present

## 2018-06-10 DIAGNOSIS — H35033 Hypertensive retinopathy, bilateral: Secondary | ICD-10-CM | POA: Diagnosis not present

## 2018-06-10 DIAGNOSIS — H2513 Age-related nuclear cataract, bilateral: Secondary | ICD-10-CM | POA: Diagnosis not present

## 2018-06-10 DIAGNOSIS — H40013 Open angle with borderline findings, low risk, bilateral: Secondary | ICD-10-CM | POA: Diagnosis not present

## 2018-06-30 ENCOUNTER — Ambulatory Visit (INDEPENDENT_AMBULATORY_CARE_PROVIDER_SITE_OTHER): Payer: BLUE CROSS/BLUE SHIELD | Admitting: Surgical

## 2018-06-30 ENCOUNTER — Encounter: Payer: Self-pay | Admitting: Surgical

## 2018-06-30 DIAGNOSIS — Z23 Encounter for immunization: Secondary | ICD-10-CM | POA: Diagnosis not present

## 2018-06-30 NOTE — Progress Notes (Signed)
Patient came in for her second shingrix vaccine today. Vaccine was given in left deltoid and patient tolerated well.

## 2018-07-01 ENCOUNTER — Other Ambulatory Visit: Payer: Self-pay | Admitting: Family Medicine

## 2018-07-01 ENCOUNTER — Encounter: Payer: Self-pay | Admitting: Family Medicine

## 2018-07-01 DIAGNOSIS — Z1231 Encounter for screening mammogram for malignant neoplasm of breast: Secondary | ICD-10-CM

## 2018-07-02 ENCOUNTER — Ambulatory Visit: Payer: BLUE CROSS/BLUE SHIELD

## 2018-07-26 DIAGNOSIS — H43811 Vitreous degeneration, right eye: Secondary | ICD-10-CM | POA: Diagnosis not present

## 2018-07-26 DIAGNOSIS — H43391 Other vitreous opacities, right eye: Secondary | ICD-10-CM | POA: Diagnosis not present

## 2018-07-26 DIAGNOSIS — H40053 Ocular hypertension, bilateral: Secondary | ICD-10-CM | POA: Diagnosis not present

## 2018-07-26 DIAGNOSIS — H5319 Other subjective visual disturbances: Secondary | ICD-10-CM | POA: Diagnosis not present

## 2018-07-26 MED FILL — LATANOPROST 0.005% EYE DRP: 0.005 | 25 days supply | Qty: 3 | Fill #0 | Status: TO

## 2018-07-28 ENCOUNTER — Encounter: Payer: Self-pay | Admitting: Family Medicine

## 2018-07-28 ENCOUNTER — Ambulatory Visit (INDEPENDENT_AMBULATORY_CARE_PROVIDER_SITE_OTHER): Payer: BLUE CROSS/BLUE SHIELD | Admitting: Family Medicine

## 2018-07-28 VITALS — BP 150/98 | HR 68 | Temp 97.8°F | Ht 61.0 in | Wt 119.4 lb

## 2018-07-28 DIAGNOSIS — H409 Unspecified glaucoma: Secondary | ICD-10-CM

## 2018-07-28 DIAGNOSIS — W57XXXA Bitten or stung by nonvenomous insect and other nonvenomous arthropods, initial encounter: Secondary | ICD-10-CM

## 2018-07-28 DIAGNOSIS — S1096XA Insect bite of unspecified part of neck, initial encounter: Secondary | ICD-10-CM

## 2018-07-28 DIAGNOSIS — R5381 Other malaise: Secondary | ICD-10-CM | POA: Diagnosis not present

## 2018-07-28 DIAGNOSIS — R030 Elevated blood-pressure reading, without diagnosis of hypertension: Secondary | ICD-10-CM | POA: Diagnosis not present

## 2018-07-28 DIAGNOSIS — R5383 Other fatigue: Secondary | ICD-10-CM | POA: Diagnosis not present

## 2018-07-28 LAB — COMPREHENSIVE METABOLIC PANEL
ALT: 11 U/L (ref 0–35)
AST: 16 U/L (ref 0–37)
Albumin: 4.4 g/dL (ref 3.5–5.2)
Alkaline Phosphatase: 24 U/L — ABNORMAL LOW (ref 39–117)
BUN: 14 mg/dL (ref 6–23)
CO2: 32 mEq/L (ref 19–32)
Calcium: 9.9 mg/dL (ref 8.4–10.5)
Chloride: 106 mEq/L (ref 96–112)
Creatinine, Ser: 0.77 mg/dL (ref 0.40–1.20)
GFR: 80.77 mL/min (ref >60.00)
Glucose, Bld: 103 mg/dL — ABNORMAL HIGH (ref 70–99)
Potassium: 4.5 mEq/L (ref 3.5–5.1)
Sodium: 143 mEq/L (ref 135–145)
Total Bilirubin: 0.6 mg/dL (ref 0.2–1.2)
Total Protein: 7.1 g/dL (ref 6.0–8.3)

## 2018-07-28 LAB — CBC WITH DIFFERENTIAL/PLATELET
Basophils Absolute: 0.1 10*3/uL (ref 0.0–0.1)
Basophils Relative: 0.9 % (ref 0.0–3.0)
Eosinophils Absolute: 0.1 10*3/uL (ref 0.0–0.7)
Eosinophils Relative: 1.4 % (ref 0.0–5.0)
HCT: 39.9 % (ref 36.0–46.0)
Hemoglobin: 13.6 g/dL (ref 12.0–15.0)
Lymphocytes Relative: 29.1 % (ref 12.0–46.0)
Lymphs Abs: 1.9 10*3/uL (ref 0.7–4.0)
MCHC: 33.9 g/dL (ref 30.0–36.0)
MCV: 91.2 fl (ref 78.0–100.0)
Monocytes Absolute: 0.5 10*3/uL (ref 0.1–1.0)
Monocytes Relative: 7.9 % (ref 3.0–12.0)
Neutro Abs: 3.9 10*3/uL (ref 1.4–7.7)
Neutrophils Relative %: 60.7 % (ref 43.0–77.0)
Platelets: 231 10*3/uL (ref 150.0–400.0)
RBC: 4.38 Mil/uL (ref 3.87–5.11)
RDW: 12.7 % (ref 11.5–15.5)
WBC: 6.5 10*3/uL (ref 4.0–10.5)

## 2018-07-28 MED ORDER — DOXYCYCLINE HYCLATE 100 MG PO TABS: 100.0000 mg | ORAL_TABLET | Freq: Two times a day (BID) | ORAL | 0 refills | Status: DC

## 2018-07-28 MED FILL — DOXYCYCLINE HYCLATE 100 MG: 100 | 10 days supply | Qty: 20 | Fill #0

## 2018-07-28 NOTE — Progress Notes (Signed)
Cheyenne Myers is a 62 y.o. female here for an acute visit.  History of Present Illness:   Cheyenne Myers, CMA acting as scribe for Dr. Briscoe Deutscher.   HPI: Patient removed tic on 07/17/18. On 8/4 you started having extreme fatigue and dull headache. She did not have any chest pain or palpitations. She does feel like her legs are weak. Tick was removed from neck. She was seen at eye doctor for "floaters in her eyes" she was started on a new eye drop for that on Monday.   PMHx, SurgHx, SocialHx, Medications, and Allergies were reviewed in the Visit Navigator and updated as appropriate.  Current Medications:   .  acyclovir (ZOVIRAX) 800 MG tablet, TID X 5 DAYS PRN, Disp: 28 tablet, Rfl: 2 .  Cholecalciferol (VITAMIN D) 1000 UNITS capsule, Take 1 capsule (1,000 Units total) by mouth 2 (two) times daily., Disp: , Rfl:  .  Coenzyme Q10 (CO Q 10) 100 MG CAPS, Take by mouth., Disp: , Rfl:  .  diazepam (VALIUM) 5 MG tablet, Take 1 tablet prior to procedure (MRI), Disp: 1 tablet, Rfl: 0 .  Glucosamine 750 MG TABS, Take 2 tablets by mouth daily., Disp: , Rfl:  .  KRILL OIL 1000 MG CAPS, Take 2 capsules (2,000 mg total) by mouth 2 (two) times daily., Disp: , Rfl:  .  Probiotic Product (PROBIOTIC DAILY PO), Take by mouth daily., Disp: , Rfl:  .  scopolamine (TRANSDERM-SCOP, 1.5 MG,) 1 MG/3DAYS, Place 1 patch (1.5 mg total) onto the skin every 3 (three) days., Disp: 4 patch, Rfl: 0   No Known Allergies   Review of Systems:   Pertinent items are noted in the HPI. Otherwise, ROS is negative.  Vitals:   Vitals:   07/28/18 1419  BP: (!) 150/98  Pulse: 68  Temp: 97.8 F (36.6 C)  TempSrc: Oral  SpO2: 97%  Weight: 119 lb 6.4 oz (54.2 kg)  Height: 5\' 1"  (1.549 m)     Body mass index is 22.56 kg/m.  Physical Exam:   Physical Exam  Constitutional: She appears well-nourished.  HENT:  Head: Normocephalic and atraumatic.  Eyes: Pupils are equal, round, and reactive to light. EOM are  normal.  Neck: Normal range of motion. Neck supple.  Cardiovascular: Normal rate, regular rhythm, normal heart sounds and intact distal pulses.  Pulmonary/Chest: Effort normal.  Abdominal: Soft.  Skin: Skin is warm.  Psychiatric: She has a normal mood and affect. Her behavior is normal.  Nursing note and vitals reviewed.  Assessment and Plan:   Sheliah was seen today for insect bite.  Diagnoses and all orders for this visit:  Malaise and fatigue Comments: DDx includes tick borne illness, viral illness. Labs pending. Advised rest, hydration. Orders: -     CBC with Differential/Platelet -     Comprehensive metabolic panel -     doxycycline (VIBRA-TABS) 100 MG tablet; Take 1 tablet (100 mg total) by mouth 2 (two) times daily.  Glaucoma Comments: New Rx of Xalatan a few days ago.  Tick bite of neck, initial encounter Comments: Okay Doxy for empiric coverage.  Orders: -     doxycycline (VIBRA-TABS) 100 MG tablet; Take 1 tablet (100 mg total) by mouth 2 (two) times daily.  Elevated blood-pressure reading, without diagnosis of hypertension Comments: New. Patient advised to keep BP log. She will f/u with her PCP about this.   . Reviewed expectations re: course of current medical issues. . Discussed self-management of symptoms. Marland Kitchen  Outlined signs and symptoms indicating need for more acute intervention. . Patient verbalized understanding and all questions were answered. Marland Kitchen Health Maintenance issues including appropriate healthy diet, exercise, and smoking avoidance were discussed with patient. . See orders for this visit as documented in the electronic medical record. . Patient received an After Visit Summary.  CMA served as Education administrator during this visit. History, Physical, and Plan performed by medical provider. The above documentation has been reviewed and is accurate and complete. Briscoe Deutscher, D.O.  Briscoe Deutscher, DO [place], [facility] 07/30/2018

## 2018-07-30 ENCOUNTER — Encounter: Payer: Self-pay | Admitting: Family Medicine

## 2018-08-02 ENCOUNTER — Ambulatory Visit: Payer: Self-pay

## 2018-08-05 ENCOUNTER — Ambulatory Visit: Payer: BLUE CROSS/BLUE SHIELD | Admitting: Family Medicine

## 2018-08-06 ENCOUNTER — Encounter: Payer: Self-pay | Admitting: Family Medicine

## 2018-08-06 DIAGNOSIS — W57XXXA Bitten or stung by nonvenomous insect and other nonvenomous arthropods, initial encounter: Secondary | ICD-10-CM

## 2018-08-06 DIAGNOSIS — R5381 Other malaise: Secondary | ICD-10-CM

## 2018-08-06 DIAGNOSIS — R5383 Other fatigue: Principal | ICD-10-CM

## 2018-08-06 DIAGNOSIS — S1096XA Insect bite of unspecified part of neck, initial encounter: Secondary | ICD-10-CM

## 2018-08-06 MED ORDER — DOXYCYCLINE HYCLATE 100 MG PO TABS: 100.0000 mg | ORAL_TABLET | Freq: Two times a day (BID) | ORAL | 0 refills | Status: DC

## 2018-08-16 DIAGNOSIS — H40013 Open angle with borderline findings, low risk, bilateral: Secondary | ICD-10-CM | POA: Diagnosis not present

## 2018-08-16 DIAGNOSIS — H1013 Acute atopic conjunctivitis, bilateral: Secondary | ICD-10-CM | POA: Diagnosis not present

## 2018-08-16 DIAGNOSIS — H40033 Anatomical narrow angle, bilateral: Secondary | ICD-10-CM | POA: Diagnosis not present

## 2018-08-16 DIAGNOSIS — H40053 Ocular hypertension, bilateral: Secondary | ICD-10-CM | POA: Diagnosis not present

## 2018-08-24 ENCOUNTER — Ambulatory Visit
Admission: RE | Admit: 2018-08-24 | Discharge: 2018-08-24 | Disposition: A | Discharge status: Home / Self Care | Payer: BLUE CROSS/BLUE SHIELD | Source: Ambulatory Visit | Attending: Family Medicine | Admitting: Family Medicine

## 2018-08-24 DIAGNOSIS — Z1231 Encounter for screening mammogram for malignant neoplasm of breast: Secondary | ICD-10-CM | POA: Diagnosis not present

## 2018-08-25 DIAGNOSIS — D171 Benign lipomatous neoplasm of skin and subcutaneous tissue of trunk: Secondary | ICD-10-CM | POA: Diagnosis not present

## 2018-08-25 DIAGNOSIS — Z85828 Personal history of other malignant neoplasm of skin: Secondary | ICD-10-CM | POA: Diagnosis not present

## 2018-08-25 DIAGNOSIS — L821 Other seborrheic keratosis: Secondary | ICD-10-CM | POA: Diagnosis not present

## 2018-08-25 DIAGNOSIS — B361 Tinea nigra: Secondary | ICD-10-CM | POA: Diagnosis not present

## 2018-08-25 MED FILL — KETOCONAZOLE 2% CREAM: 2 | 20 days supply | Qty: 15 | Fill #0

## 2018-08-26 ENCOUNTER — Ambulatory Visit: Payer: BLUE CROSS/BLUE SHIELD

## 2018-08-26 ENCOUNTER — Ambulatory Visit: Payer: BLUE CROSS/BLUE SHIELD | Admitting: Family Medicine

## 2018-09-02 DIAGNOSIS — M47812 Spondylosis without myelopathy or radiculopathy, cervical region: Secondary | ICD-10-CM | POA: Diagnosis not present

## 2018-09-02 MED FILL — predniSONE 10 MG (48) TBPK: 10 | 12 days supply | Qty: 48 | Fill #0

## 2018-09-03 ENCOUNTER — Ambulatory Visit (INDEPENDENT_AMBULATORY_CARE_PROVIDER_SITE_OTHER): Payer: BLUE CROSS/BLUE SHIELD | Admitting: Family Medicine

## 2018-09-03 ENCOUNTER — Encounter: Payer: Self-pay | Admitting: Family Medicine

## 2018-09-03 VITALS — BP 152/86 | HR 87 | Temp 98.4°F | Ht 61.0 in | Wt 114.6 lb

## 2018-09-03 DIAGNOSIS — R202 Paresthesia of skin: Secondary | ICD-10-CM | POA: Diagnosis not present

## 2018-09-03 DIAGNOSIS — R29898 Other symptoms and signs involving the musculoskeletal system: Secondary | ICD-10-CM | POA: Diagnosis not present

## 2018-09-03 DIAGNOSIS — R030 Elevated blood-pressure reading, without diagnosis of hypertension: Secondary | ICD-10-CM | POA: Diagnosis not present

## 2018-09-03 NOTE — Patient Instructions (Addendum)
Call your eye doctor to make sure dose of prednisone is ok for the eye pressure.   im less concerned about the prednisone in regards to lyme disease as you were adequately treated.   I think Dr. Clarice Pole advice was very reasonable and lets follow that path  If not better with prednisone or on follow up MRI we will have to get a new game plan   See [place] back for new or worsening symptoms

## 2018-09-03 NOTE — Progress Notes (Signed)
Subjective:  Cheyenne Myers is a 62 y.o. year old very pleasant female patient who presents for/with See problem oriented charting ROS- no fever, chills, arthralgias . Does have some fatigue and paresthesias in arms as well as weakness sensation in legs.   Past Medical History-  Patient Active Problem List   Diagnosis Date Noted  . Cervical radiculopathy - managed by neurosurgeon 07/18/2014    Priority: Medium  . Hyperlipidemia 08/05/2007    Priority: Medium  . Hx of adenomatous polyp of colon 02/13/2016    Priority: Low  . H/O cold sores 07/18/2014    Priority: Low  . Glaucoma 07/28/2018    Medications- reviewed and updated Current Outpatient Medications  Medication Sig Dispense Refill  . acyclovir (ZOVIRAX) 800 MG tablet TID X 5 DAYS PRN 28 tablet 2  . Cholecalciferol (VITAMIN D) 1000 UNITS capsule Take 1 capsule (1,000 Units total) by mouth 2 (two) times daily.    . Coenzyme Q10 (CO Q 10) 100 MG CAPS Take by mouth.    . Glucosamine 750 MG TABS Take 2 tablets by mouth daily.    Marland Kitchen KRILL OIL 1000 MG CAPS Take 2 capsules (2,000 mg total) by mouth 2 (two) times daily.    Marland Kitchen latanoprost (XALATAN) 0.005 % ophthalmic solution   6  . predniSONE (STERAPRED UNI-PAK 48 TAB) 10 MG (48) TBPK tablet   0  . Probiotic Product (PROBIOTIC DAILY PO) Take by mouth daily.     No current facility-administered medications for this visit.     Objective: BP (!) 152/86 (BP Location: Left Arm, Patient Position: Sitting, Cuff Size: Normal)   Pulse 87   Temp 98.4 F (36.9 C) (Oral)   Ht 5\' 1"  (1.549 m)   Wt 114 lb 9.6 oz (52 kg)   LMP 10/17/2009 (Exact Date)   SpO2 97%   BMI 21.65 kg/m  Gen: NAD, resting comfortably CV: RRR no murmurs rubs or gallops Lungs: CTAB no crackles, wheeze, rhonchi Abdomen: soft/nontender/nondistended/normal bowel sounds. No rebound or guarding.  Ext: no edema Skin: warm, dry Neuro: no facial weakness/asymmetry. No objective muscle weakness. Patient states drags feet when  walking but is able to easily lift knee up to waist level. 5/5 strength and upper and lower extremities. Intact sensation distally to monofilament.   Assessment/Plan:  White coat hypertension S: controlled on no rx at home. Home BPs 120/80 or less- patient admits to high BP in office frequently BP Readings from Last 3 Encounters:  09/03/18 (!) 152/86  07/28/18 (!) 150/98  04/21/18 118/84  A/P: We discussed blood pressure goal of <140/90. Continue without meds as long as home BP remains controlled   Weakness of lower extremities S: went [place] 25th [place] on [place]. She had a tick attached to her neck for about 24 hours. Was not engorged. Got full tick off. Next week was feeling better but halfway through a walk had floaters/flashes of light. Saw eye doctor- Pressures were up so she placed her on eye drops. Went camping again around [employer] park- felt great but later on august 6th started feeling really weak. August 7th came to see Dr. Juleen China- placed on doxycycline for 10 days. Felt very fatigued as main symptom. Never had bullseye or other symptoms. No joint aches. No fever. Lyme testing was not done due to expense and potential for false positive- plus plan was to empirically treat regardless. She had travelled in [place] and concern that she was in areas of higher  prevalence than [place].   Of doxycycline, she took 10 days total but vomited up 2 pills. She felt better after last dose. August 25th went to church and got a big bear hug and immediately set off tingling in arms and felt fatigued (had similar several years ago and follows with neurosurgeon). She was concerned it was coming from her neck. September 4th picked up some water bottles and next AM woke up with bad headache and legs felt weak. Went to neurosurgeon yesterday. Did 4 cervical x-rays which showed significant arthritic changes it sounds like- neck pain up to 4/10 pain after exam. Had a bone spur that has  increased in size significantly. Dr. Ellene Route wanted to start her on prednisone to help with bone spurs. Also advised, rest for 3 weeks and get MRI if not approved. Took 6 pills of prednisone yesterday and 2 pills today. Symptoms right now include leg weakness, some fatigue, Weakness in arms still and paresthesias.  She reports reassuring neurological examination with Dr. Ellene Route yesterday including no objective weakness on his exam as well.   She reports  Lyme test years ago negative.  A/P:  62 year old female with history cervical radiculopathy with what sounds like recurrent cervical radiculopathy. Has seen neurosurgery who stated reassuring neurological exam and recommended course of prednisone. Patient had many concerns about this so wanted to chat with me. First she was recently treated for potential lyme disease and she was concerned about recurrence/flare up- and in light of that she was nervous to take prednisone in case it worsened lyme symptoms by lowering immune system. We reviewed that original treatment was empiric and she didn't have clear cut lyme disease- we reviewed that treatment was reasonable and appropriate. Since she had appropriate treatment, we are less concerned that this is lyme disease causing symptoms. Plus neurosurgery seemed to think could be from history cervical disc disease. With that being said if she has worsening symptoms on prednisone advised her to stop and follow up immediately. She has a reassuring neuro exam today with no objective muscle weakness- she reports leg weakness but stands without difficulty and has 5/5 strength on exam. We did originally consider lyme disease testing by western blot due to lower false positive risk than ELISA testing but patient on high deductible plan and cost being about 225 - we ultimately opted against this.   She also has questions in relation to glaucoma- I advied her to call her eye doctor ASAP before continuing prednisone and that  dosing would need to be coordinated between neurosurgery and optho.   Extended counseling needed today on above topics  Future Appointments  Date Time Provider [place]  01/19/2019  2:45 PM Regina Eck, CNM [place] None  04/25/2019  8:15 AM Yong Channel Brayton Mars, MD [facility]   Lab/Order associations: Weakness of both lower extremities - Plan: CANCELED: B. Burgdorfi Antibodies by WB, CANCELED: B. Burgdorfi Antibodies by WB  White coat syndrome without diagnosis of hypertension  Time Stamp The duration of face-to-face time during this visit was greater than 25 minutes. Greater than 50% of this time was spent in counseling, explanation of diagnosis, planning of further management, and/or coordination of care including discussion of lyme disease symptoms and inconsistencies with her symptoms, options for testing, potential causes, discussing patient concerns about worsening lyme disease by suppressing immune system, relation to other disease processes like glaucoma    Return precautions advised.  Garret Reddish, MD

## 2018-09-16 DIAGNOSIS — G8220 Paraplegia, unspecified: Secondary | ICD-10-CM | POA: Insufficient documentation

## 2018-09-23 DIAGNOSIS — M47812 Spondylosis without myelopathy or radiculopathy, cervical region: Secondary | ICD-10-CM | POA: Diagnosis not present

## 2018-09-23 DIAGNOSIS — M4802 Spinal stenosis, cervical region: Secondary | ICD-10-CM | POA: Diagnosis not present

## 2018-09-23 DIAGNOSIS — R030 Elevated blood-pressure reading, without diagnosis of hypertension: Secondary | ICD-10-CM | POA: Diagnosis not present

## 2018-09-28 ENCOUNTER — Ambulatory Visit (INDEPENDENT_AMBULATORY_CARE_PROVIDER_SITE_OTHER): Payer: BLUE CROSS/BLUE SHIELD

## 2018-09-28 DIAGNOSIS — Z23 Encounter for immunization: Secondary | ICD-10-CM

## 2018-09-28 NOTE — Progress Notes (Signed)
Patient in today for Flu vaccine. VIS given. Administered in left arm. Tolerated well

## 2018-09-28 NOTE — Patient Instructions (Signed)
There are no preventive care reminders to display for this patient.  Depression screen PHQ 2/9 04/21/2018  Decreased Interest 0  Down, Depressed, Hopeless 0  PHQ - 2 Score 0

## 2018-10-08 DIAGNOSIS — M4802 Spinal stenosis, cervical region: Secondary | ICD-10-CM | POA: Diagnosis not present

## 2018-10-12 ENCOUNTER — Ambulatory Visit (INDEPENDENT_AMBULATORY_CARE_PROVIDER_SITE_OTHER): Payer: BLUE CROSS/BLUE SHIELD | Admitting: Neurology

## 2018-10-12 ENCOUNTER — Encounter: Payer: Self-pay | Admitting: Neurology

## 2018-10-12 ENCOUNTER — Other Ambulatory Visit: Payer: Self-pay

## 2018-10-12 VITALS — BP 130/92 | HR 84 | Resp 14 | Ht 61.0 in | Wt 115.0 lb

## 2018-10-12 DIAGNOSIS — R799 Abnormal finding of blood chemistry, unspecified: Secondary | ICD-10-CM | POA: Diagnosis not present

## 2018-10-12 DIAGNOSIS — R202 Paresthesia of skin: Secondary | ICD-10-CM

## 2018-10-12 DIAGNOSIS — R531 Weakness: Secondary | ICD-10-CM | POA: Diagnosis not present

## 2018-10-12 NOTE — Progress Notes (Signed)
Reason for visit: Paresthesias, fatigue  Referring physician: Dr. Melany Guernsey Roudebush is a 62 y.o. female  History of present illness:  Ms. Aronoff is a 62 year old right-handed white female with a history of cervical spondylosis, she has been seen and followed through Dr. Ellene Route in the past.  The patient has been functioning fairly well until late July 2019.  The patient was bitten by tick, within 10 days she began having problems with fatigue and paresthesias on the arms and legs and mid back.  The patient was treated with a 10-day course of doxycycline.  The patient seemed to improve some with the fatigue but this never completely resolved.  The patient had some worsening of symptoms in early September 2019, at church someone hugged her around the neck and this initiated neck discomfort and some paresthesias down the arms.  She was lifting heavy objects at another time, and again her symptoms worsened.  At this point, the patient has indicated that when she flexes or extends the neck she can increase the paresthesias and burning sensations down the arms and legs and in the mid portion of the back.  She feels weak all over, even though the fatigue issue has improved some since onset.  The patient denies issues controlling the bowels or the bladder.  She denies any balance issues.  She has had some mild cognitive changes this are not severe.  The patient has had some floaters in the vision, some occasional sparkling lights, but no loss of vision.  She denies any problems with speech or swallowing.  She will be entering into physical therapy in the near future.  She brings a CD of a recent MRI of the cervical spine done by Dr. Ellene Route, this was reviewed, does show some spinal stenosis of the C4-5 and C5-6 levels without significant cord compression but there is some flattening of the cord at the C5-6 level.  Past Medical History:  Diagnosis Date  . Abnormal Pap smear 06/2004   ASCUS, + HPV, CIN II LEEP   . Cervical radicular pain    managed by [facility]  . Hx of adenomatous polyp of colon 02/13/2016  . Hyperlipemia   . MITRAL VALVE PROLAPSE 08/05/2007   Qualifier: Diagnosis of  By: Tiney Rouge CMA, Ellison Hughs    . URINARY TRACT INFECTION, RECURRENT 12/06/2007   years ago.     Past Surgical History:  Procedure Laterality Date  . CARPAL TUNNEL RELEASE Right 08/2011  . CERVICAL BIOPSY  W/ LOOP ELECTRODE EXCISION  06/2004   CIN II  . COLPOSCOPY    . WISDOM TOOTH EXTRACTION  age 51    Family History  Problem Relation Age of Onset  . Heart disease Mother   . Diabetes Mother   . Alzheimer's disease Mother   . Stroke Sister        tia and pacer  . Atrial fibrillation Sister   . Dementia Sister        early  . Transient ischemic attack Sister   . Dementia Sister        severe emotional disturbance  . Heart attack Father        age 16 died- but was smoker.   . Diabetes Maternal Grandmother   . Breast cancer Maternal Grandmother   . Diabetes Maternal Grandfather   . Colon cancer Sister        55    Social history:  reports that she has never smoked. She has never used  smokeless tobacco. She reports that she does not drink alcohol or use drugs.  Medications:  Prior to Admission medications   Medication Sig Start Date End Date Taking? Authorizing Provider  Cholecalciferol (VITAMIN D) 1000 UNITS capsule Take 1 capsule (1,000 Units total) by mouth 2 (two) times daily. 02/21/11  Yes Ricard Dillon, MD  Coenzyme Q10 (CO Q 10) 100 MG CAPS Take by mouth.   Yes [provider]  Glucosamine 750 MG TABS Take 2 tablets by mouth daily.   Yes [provider]  KRILL OIL 1000 MG CAPS Take 2 capsules (2,000 mg total) by mouth 2 (two) times daily. 02/21/11  Yes Ricard Dillon, MD  latanoprost (XALATAN) 0.005 % ophthalmic solution  07/26/18  Yes [provider]  Probiotic Product (PROBIOTIC DAILY PO) Take by mouth daily.   Yes [provider]  acyclovir (ZOVIRAX) 800 MG tablet  TID X 5 DAYS PRN Patient not taking: Reported on 10/12/2018 04/22/18   Marin Olp, MD  predniSONE (STERAPRED UNI-PAK 48 TAB) 10 MG (48) TBPK tablet  09/02/18   [provider]     No Known Allergies  ROS:  Out of a complete 14 system review of symptoms, the patient complains only of the following symptoms, and all other reviewed systems are negative.  Paresthesias, numbness, weakness, tremor Fatigue Anxiety, decreased energy, change in appetite  Blood pressure (!) 130/92, pulse 84, resp. rate 14, height 5\' 1"  (1.549 m), weight 115 lb (52.2 kg), last menstrual period 10/17/2009.  Physical Exam  General: The patient is alert and cooperative at the time of the examination.  Eyes: Pupils are equal, round, and reactive to light. Discs are flat bilaterally.  Neck: The neck is supple, no carotid bruits are noted.  Respiratory: The respiratory examination is clear.  Cardiovascular: The cardiovascular examination reveals a regular rate and rhythm, no obvious murmurs or rubs are noted.  Skin: Extremities are without significant edema.  Neurologic Exam  Mental status: The patient is alert and oriented x 3 at the time of the examination. The patient has apparent normal recent and remote memory, with an apparently normal attention span and concentration ability.  Cranial nerves: Facial symmetry is present. There is good sensation of the face to pinprick and soft touch bilaterally. The strength of the facial muscles and the muscles to head turning and shoulder shrug are normal bilaterally. Speech is well enunciated, no aphasia or dysarthria is noted. Extraocular movements are full. Visual fields are full. The tongue is midline, and the patient has symmetric elevation of the soft palate. No obvious hearing deficits are noted.  Motor: The motor testing reveals 5 over 5 strength of all 4 extremities. Good symmetric motor tone is noted throughout.  Sensory: Sensory testing is intact  to pinprick, soft touch, vibration sensation, and position sense on all 4 extremities. No evidence of extinction is noted.  Coordination: Cerebellar testing reveals good finger-nose-finger and heel-to-shin bilaterally.  Gait and station: Gait is normal. Tandem gait is normal. Romberg is negative. No drift is seen.  Reflexes: Deep tendon reflexes are symmetric and normal bilaterally. Toes are downgoing bilaterally.   Assessment/Plan:  1.  Subjective paresthesias, fatigue  2.  Recent tick bite  3.  Cervical spondylosis, spinal stenosis  The clinical examination today is completely normal.  The patient has normal strength, sensation, reflexes, and balance.  The patient reports subjective paresthesias and burning, she reports a Lhermitte's phenomenon with neck flexion and extension.  This suggests that the  spinal stenosis may be symptomatic.  The patient however also is reporting generalized fatigue since the tick bite, further blood work will be undertaken at this time.  No evidence of a peripheral nervous system process is noted on clinical examination, reflexes and strength are normal.  She will follow-up in 3 months.  Jill Alexanders MD 10/12/2018 12:23 PM  [facility] [address] [place], [place] [address]  Phone [phone] Fax [phone]

## 2018-10-14 DIAGNOSIS — M4802 Spinal stenosis, cervical region: Secondary | ICD-10-CM | POA: Diagnosis not present

## 2018-10-14 LAB — PAN-ANCA
ANCA Proteinase 3: <3.5 U/mL (ref 0.0–3.5)
Myeloperoxidase Ab: <9 U/mL (ref 0.0–9.0)

## 2018-10-14 LAB — ANA W/REFLEX: Anti Nuclear Antibody(ANA): NEGATIVE

## 2018-10-14 LAB — ANGIOTENSIN CONVERTING ENZYME: ANGIO CONVERT ENZYME: 44 U/L (ref 14–82)

## 2018-10-14 LAB — VITAMIN B12: VITAMIN B 12: 987 pg/mL (ref 232–1245)

## 2018-10-14 LAB — B. BURGDORFI ANTIBODIES: Lyme IgG/IgM Ab: <0.91 {ISR} (ref 0.00–0.90)

## 2018-10-14 LAB — RPR: RPR: NONREACTIVE

## 2018-10-14 LAB — COPPER, SERUM: COPPER: 129 ug/dL (ref 72–166)

## 2018-10-14 LAB — ROCKY MTN SPOTTED FVR ABS PNL(IGG+IGM)
RMSF IgG: NEGATIVE
RMSF IgM: 0.75 index (ref 0.00–0.89)

## 2018-10-14 LAB — SEDIMENTATION RATE: SED RATE: 15 mm/h (ref 0–40)

## 2018-10-14 LAB — HIV ANTIBODY (ROUTINE TESTING W REFLEX): HIV Screen 4th Generation wRfx: NONREACTIVE

## 2018-10-14 LAB — RHEUMATOID FACTOR: Rhuematoid fact SerPl-aCnc: <10 IU/mL (ref 0.0–13.9)

## 2018-10-25 DIAGNOSIS — M4802 Spinal stenosis, cervical region: Secondary | ICD-10-CM | POA: Diagnosis not present

## 2018-10-25 DIAGNOSIS — H401131 Primary open-angle glaucoma, bilateral, mild stage: Secondary | ICD-10-CM | POA: Diagnosis not present

## 2018-10-25 DIAGNOSIS — H40051 Ocular hypertension, right eye: Secondary | ICD-10-CM | POA: Diagnosis not present

## 2018-10-25 DIAGNOSIS — H1013 Acute atopic conjunctivitis, bilateral: Secondary | ICD-10-CM | POA: Diagnosis not present

## 2018-10-25 DIAGNOSIS — H40033 Anatomical narrow angle, bilateral: Secondary | ICD-10-CM | POA: Diagnosis not present

## 2018-10-29 DIAGNOSIS — M4802 Spinal stenosis, cervical region: Secondary | ICD-10-CM | POA: Diagnosis not present

## 2018-11-03 DIAGNOSIS — M4802 Spinal stenosis, cervical region: Secondary | ICD-10-CM | POA: Diagnosis not present

## 2018-11-05 DIAGNOSIS — M4802 Spinal stenosis, cervical region: Secondary | ICD-10-CM | POA: Diagnosis not present

## 2018-11-11 ENCOUNTER — Telehealth: Payer: Self-pay | Admitting: Neurology

## 2018-11-11 NOTE — Telephone Encounter (Signed)
Patient called in stating she see's Dr. Ellene Route but he is out of the country until next week. She said a lot has changed since the last time she saw Dr. Jannifer Franklin and wanted to know if she can get in tomorrow to see him.

## 2018-11-11 NOTE — Telephone Encounter (Signed)
Spoke with Abigail Butts.  She was just seen 10/12/18. Per [facility] notes, physical exam is normal, and so he wants NS opinion as to if stenosis is causing sx. I have explained pt. should f/u with NS as planned. She verbalized understanding of same/fim

## 2018-11-15 DIAGNOSIS — M4802 Spinal stenosis, cervical region: Secondary | ICD-10-CM | POA: Diagnosis not present

## 2018-11-16 ENCOUNTER — Telehealth: Payer: Self-pay | Admitting: Family Medicine

## 2018-11-16 MED ORDER — ALPRAZOLAM 0.5 MG PO TABS: 0.5000 mg | ORAL_TABLET | Freq: Every day | ORAL | 0 refills | Status: DC | PRN

## 2018-11-16 NOTE — Telephone Encounter (Signed)
I sent in Xanax for her to try around the time of the MRI.  She should not drive for 8 hours after taking this-so will need to have someone with her.

## 2018-11-16 NOTE — Telephone Encounter (Signed)
Called and let patient know that medication had been sent to pharmacy. She verbalized understanding

## 2018-11-16 NOTE — Telephone Encounter (Signed)
See note

## 2018-11-16 NOTE — Telephone Encounter (Signed)
Copied from [place] [id number]. Topic: General - Other >> Nov 16, 2018  8:31 AM Lennox Solders wrote: Reason for CRM: pt is waiting to have mri of her back and would like to have something to take for claustrophobic . Pt has tried diazepam 5 mg and it did not help. Dr Ellene Route will order MRI. [place] . Mri has not been schedule yet. Pt wants medication to have on hand for mri. Pt has an appt with dr elsner on 11-25-18

## 2018-11-17 DIAGNOSIS — R29898 Other symptoms and signs involving the musculoskeletal system: Secondary | ICD-10-CM | POA: Diagnosis not present

## 2018-11-25 DIAGNOSIS — R29898 Other symptoms and signs involving the musculoskeletal system: Secondary | ICD-10-CM | POA: Diagnosis not present

## 2018-11-26 ENCOUNTER — Other Ambulatory Visit: Payer: Self-pay | Admitting: Neurological Surgery

## 2018-11-26 DIAGNOSIS — R29898 Other symptoms and signs involving the musculoskeletal system: Secondary | ICD-10-CM

## 2018-11-28 ENCOUNTER — Encounter: Payer: Self-pay | Admitting: Family Medicine

## 2018-11-29 ENCOUNTER — Telehealth: Payer: Self-pay | Admitting: Neurology

## 2018-11-29 ENCOUNTER — Ambulatory Visit (INDEPENDENT_AMBULATORY_CARE_PROVIDER_SITE_OTHER): Payer: BLUE CROSS/BLUE SHIELD | Admitting: Neurology

## 2018-11-29 ENCOUNTER — Encounter: Payer: Self-pay | Admitting: Neurology

## 2018-11-29 ENCOUNTER — Other Ambulatory Visit: Payer: BLUE CROSS/BLUE SHIELD

## 2018-11-29 VITALS — BP 129/90 | HR 83 | Ht 61.0 in | Wt 109.0 lb

## 2018-11-29 DIAGNOSIS — R202 Paresthesia of skin: Secondary | ICD-10-CM | POA: Diagnosis not present

## 2018-11-29 MED ORDER — TRAMADOL HCL 50 MG PO TABS: 50.0000 mg | ORAL_TABLET | Freq: Four times a day (QID) | ORAL | 1 refills | Status: DC | PRN

## 2018-11-29 MED ORDER — GABAPENTIN 300 MG PO CAPS: 300.0000 mg | ORAL_CAPSULE | Freq: Two times a day (BID) | ORAL | 1 refills | Status: DC

## 2018-11-29 MED ORDER — LIDOCAINE 5 % EX PTCH: 1.0000 | MEDICATED_PATCH | CUTANEOUS | 1 refills | Status: DC

## 2018-11-29 MED ORDER — ALPRAZOLAM 0.5 MG PO TABS: ORAL_TABLET | ORAL | 0 refills | Status: DC

## 2018-11-29 NOTE — Addendum Note (Signed)
Addended by: Kathrynn Ducking on: 11/29/2018 09:11 AM   Modules accepted: Orders

## 2018-11-29 NOTE — Telephone Encounter (Signed)
I called the patient.  The patient has had gradual worsening of her symptoms, she has significant pain now on all 4 extremities, burning pain in the feet, difficulty with walking.  Dr. Ellene Route will be getting MRI of the mid back and lumbar spine, we may need to repeat MRI of the cervical spine and even brain with and without gadolinium enhancement to exclude demyelinating disease or transverse myelitis.  I will try to get the patient worked into the office.  I will call in a prescription for gabapentin and Ultram to help treat the pain.

## 2018-11-29 NOTE — Progress Notes (Signed)
Reason for visit: Diffuse pain and discomfort  Cheyenne Myers is an 62 y.o. female  History of present illness:  Cheyenne Myers is a 62 year old right-handed white female who comes in today on an urgent basis for evaluation of pain and dysesthesias of all 4 extremities.  The patient has had worsening pain in the feet with weightbearing that has occurred since November 2019.  MRI of the cervical spine shown some mild spinal stenosis with some contact of the spinal cord at the C5-6 level, reviewed the disc again today does not show evidence of spinal cord injury or severe spinal cord compression.  The patient is to undergo MRI of the thoracic spine and lumbar spine in the near future, she reports pressure in the low back, she has discomfort in the legs, she is having severe pain with weightbearing involving her feet that prevent her from walking longer distances.  The patient has significant pain between the shoulder blades, she has had extensive blood work done previously, she was concerned about Lyme disease but these studies were unremarkable.  The patient has had no weakness of the extremities, she denies issues controlling the bowels or the bladder.  She does report when she flexes the neck down that she does have increased sensations down the arms bilaterally.  Past Medical History:  Diagnosis Date  . Abnormal Pap smear 06/2004   ASCUS, + HPV, CIN II LEEP  . Cervical radicular pain    managed by [facility]  . Hx of adenomatous polyp of colon 02/13/2016  . Hyperlipemia   . MITRAL VALVE PROLAPSE 08/05/2007   Qualifier: Diagnosis of  By: Tiney Rouge CMA, Ellison Hughs    . URINARY TRACT INFECTION, RECURRENT 12/06/2007   years ago.     Past Surgical History:  Procedure Laterality Date  . CARPAL TUNNEL RELEASE Right 08/2011  . CERVICAL BIOPSY  W/ LOOP ELECTRODE EXCISION  06/2004   CIN II  . COLPOSCOPY    . WISDOM TOOTH EXTRACTION  age 59    Family History  Problem Relation Age of Onset  . Heart disease Mother    . Diabetes Mother   . Alzheimer's disease Mother   . Stroke Sister        tia and pacer  . Atrial fibrillation Sister   . Dementia Sister        early  . Transient ischemic attack Sister   . Dementia Sister        severe emotional disturbance  . Heart attack Father        age 68 died- but was smoker.   . Diabetes Maternal Grandmother   . Breast cancer Maternal Grandmother   . Diabetes Maternal Grandfather   . Colon cancer Sister        60    Social history:  reports that she has never smoked. She has never used smokeless tobacco. She reports that she does not drink alcohol or use drugs.   No Known Allergies  Medications:  Prior to Admission medications   Medication Sig Start Date End Date Taking? Authorizing Provider  acyclovir (ZOVIRAX) 800 MG tablet TID X 5 DAYS PRN 04/22/18  Yes Marin Olp, MD  Cholecalciferol (VITAMIN D) 1000 UNITS capsule Take 1 capsule (1,000 Units total) by mouth 2 (two) times daily. 02/21/11  Yes Ricard Dillon, MD  Coenzyme Q10 (CO Q 10) 100 MG CAPS Take by mouth.   Yes [provider]  gabapentin (NEURONTIN) 300 MG capsule Take 1 capsule (  300 mg total) by mouth 2 (two) times daily. 11/29/18  Yes Kathrynn Ducking, MD  Glucosamine 750 MG TABS Take 2 tablets by mouth daily.   Yes [provider]  KRILL OIL 1000 MG CAPS Take 2 capsules (2,000 mg total) by mouth 2 (two) times daily. 02/21/11  Yes Ricard Dillon, MD  latanoprost (XALATAN) 0.005 % ophthalmic solution  07/26/18  Yes [provider]  predniSONE (STERAPRED UNI-PAK 48 TAB) 10 MG (48) TBPK tablet  09/02/18  Yes [provider]  Probiotic Product (PROBIOTIC DAILY PO) Take by mouth daily.   Yes [provider]  traMADol (ULTRAM) 50 MG tablet Take 1 tablet (50 mg total) by mouth every 6 (six) hours as needed. 11/29/18  Yes Kathrynn Ducking, MD    ROS:  Out of a complete 14 system review of symptoms, the patient complains only of the following symptoms,  and all other reviewed systems are negative.  Decreased activity, decreased appetite, excessive sweating Light sensitivity Palpitations of the heart Nausea Back pain, walking difficulty, neck pain Dizziness, numbness, weakness, tremors Anxiety  Blood pressure 129/90, pulse 83, height 5\' 1"  (1.549 m), weight 109 lb (49.4 kg), last menstrual period 10/17/2009.  Physical Exam  General: The patient is alert and cooperative at the time of the examination.  Skin: No significant peripheral edema is noted.   Neurologic Exam  Mental status: The patient is alert and oriented x 3 at the time of the examination. The patient has apparent normal recent and remote memory, with an apparently normal attention span and concentration ability.   Cranial nerves: Facial symmetry is present. Speech is normal, no aphasia or dysarthria is noted. Extraocular movements are full. Visual fields are full.  Motor: The patient has good strength in all 4 extremities.  Sensory examination: Soft touch sensation is symmetric on the face, arms, and legs.  Sensation to pinprick, soft touch, and vibration sensation is intact on all 4 extremities.  Coordination: The patient has good finger-nose-finger and heel-to-shin bilaterally.  Gait and station: The patient has a normal gait. Tandem gait is normal. Romberg is negative. No drift is seen.  Reflexes: Deep tendon reflexes are symmetric, and are normal throughout.   Assessment/Plan:  1.  Subjective pain and dysesthesias, all 4 extremities  Clinical examination once again is normal.  The patient reports worsening discomfort and severe pain in the feet with weightbearing, she has pain in the intrascapular area and in the low back.  Further blood work will be done today.  The patient will have MRI of the thoracic spine and low back, if the studies are normal, we may consider MRI of the cervical spine with contrast and MRI of the brain to exclude demyelinating disease.   The patient has been given a prescription for gabapentin to take 300 mg twice daily, Ultram was given for pain, she was given a prescription for a Lidoderm patch as well.  Cheyenne Alexanders MD 11/29/2018 1:09 PM  [facility] [address] [place], [place] [address]  Phone [phone] Fax [phone]

## 2018-11-29 NOTE — Telephone Encounter (Signed)
Called pt. Scheduled appt today at 12pm. Advised her to check in 1130-1145am. She verbalized understanding.

## 2018-11-29 NOTE — Telephone Encounter (Signed)
Dr. Jannifer Franklin- how would you like to proceed?

## 2018-11-29 NOTE — Telephone Encounter (Signed)
Patient says shooting pain up her spine x 2-3 weeks. She saw Dr. Ellene Route last Thursday and was told to see Dr. Jannifer Franklin. Lime disease has not been ruled out. She is not sure what to do. Her legs are so weak she can hardly walk. Please call.Cheyenne Myers

## 2018-11-30 ENCOUNTER — Ambulatory Visit
Admission: RE | Admit: 2018-11-30 | Discharge: 2018-11-30 | Disposition: A | Discharge status: Home / Self Care | Payer: BLUE CROSS/BLUE SHIELD | Source: Ambulatory Visit | Attending: Neurological Surgery | Admitting: Neurological Surgery

## 2018-11-30 DIAGNOSIS — M48061 Spinal stenosis, lumbar region without neurogenic claudication: Secondary | ICD-10-CM | POA: Diagnosis not present

## 2018-11-30 DIAGNOSIS — R29898 Other symptoms and signs involving the musculoskeletal system: Secondary | ICD-10-CM

## 2018-12-01 ENCOUNTER — Telehealth: Payer: Self-pay | Admitting: Neurology

## 2018-12-01 LAB — COMPREHENSIVE METABOLIC PANEL
ALT: 11 IU/L (ref 0–32)
AST: 13 IU/L (ref 0–40)
Albumin/Globulin Ratio: 3.1 — ABNORMAL HIGH (ref 1.2–2.2)
Albumin: 4.9 g/dL — ABNORMAL HIGH (ref 3.6–4.8)
Alkaline Phosphatase: 26 IU/L — ABNORMAL LOW (ref 39–117)
BUN/Creatinine Ratio: 22 (ref 12–28)
BUN: 15 mg/dL (ref 8–27)
Bilirubin Total: 0.8 mg/dL (ref 0.0–1.2)
CO2: 20 mmol/L (ref 20–29)
CREATININE: 0.68 mg/dL (ref 0.57–1.00)
Calcium: 9.7 mg/dL (ref 8.7–10.3)
Chloride: 102 mmol/L (ref 96–106)
GFR calc Af Amer: 108 mL/min/{1.73_m2} (ref >59)
GFR calc non Af Amer: 94 mL/min/{1.73_m2} (ref >59)
Globulin, Total: 1.6 g/dL (ref 1.5–4.5)
Glucose: 100 mg/dL — ABNORMAL HIGH (ref 65–99)
Potassium: 4.1 mmol/L (ref 3.5–5.2)
Sodium: 141 mmol/L (ref 134–144)
Total Protein: 6.5 g/dL (ref 6.0–8.5)

## 2018-12-01 LAB — HEPATITIS C ANTIBODY

## 2018-12-01 LAB — CBC WITH DIFFERENTIAL/PLATELET
Basophils Absolute: 0 10*3/uL (ref 0.0–0.2)
Basos: 1 %
EOS (ABSOLUTE): 0 10*3/uL (ref 0.0–0.4)
Eos: 0 %
Hematocrit: 41.3 % (ref 34.0–46.6)
Hemoglobin: 13.6 g/dL (ref 11.1–15.9)
IMMATURE GRANS (ABS): 0 10*3/uL (ref 0.0–0.1)
Immature Granulocytes: 0 %
Lymphocytes Absolute: 1.4 10*3/uL (ref 0.7–3.1)
Lymphs: 17 %
MCH: 30.2 pg (ref 26.6–33.0)
MCHC: 32.9 g/dL (ref 31.5–35.7)
MCV: 92 fL (ref 79–97)
Monocytes Absolute: 0.5 10*3/uL (ref 0.1–0.9)
Monocytes: 6 %
NEUTROS PCT: 76 %
Neutrophils Absolute: 6.1 10*3/uL (ref 1.4–7.0)
Platelets: 287 10*3/uL (ref 150–450)
RBC: 4.5 x10E6/uL (ref 3.77–5.28)
RDW: 12.6 % (ref 12.3–15.4)
WBC: 8 10*3/uL (ref 3.4–10.8)

## 2018-12-01 LAB — C-REACTIVE PROTEIN: CRP: <1 mg/L (ref 0–10)

## 2018-12-01 LAB — TSH: TSH: 0.866 u[IU]/mL (ref 0.450–4.500)

## 2018-12-01 LAB — SEDIMENTATION RATE: Sed Rate: 8 mm/hr (ref 0–40)

## 2018-12-01 LAB — HTLV I+II ANTIBODIES, (EIA), BLD: HTLV I/II AB: NEGATIVE

## 2018-12-01 MED ORDER — GABAPENTIN 100 MG PO CAPS: 100.0000 mg | ORAL_CAPSULE | Freq: Three times a day (TID) | ORAL | 3 refills | Status: DC

## 2018-12-01 NOTE — Telephone Encounter (Signed)
Dr. Jannifer Franklin,  Please advise on message.

## 2018-12-01 NOTE — Telephone Encounter (Signed)
I called the patient.  The 300 mg gabapentin capsule is too much for her, we will reduce the dose to 100 mg 3 times daily.  A prescription was sent in.  MRI of the lumbar spine shows some neuroforaminal stenosis at the L5-S1 level on the right, otherwise the study is relatively unremarkable.   MRI lumbar 11/30/18:  IMPRESSION: 1. At L4-5 there is mild bilateral facet arthropathy. 2. At L5-S1 there is a mild broad-based disc bulge. Severe right and mild left facet arthropathy.

## 2018-12-01 NOTE — Telephone Encounter (Signed)
Pt called stating that medication gabapentin (NEURONTIN) 300 MG capsule is currently too strong her, stating once she takes medication she is "knocked out for awhile". Requesting a call to discuss a lower dosing. Also wanted to inform Dr. Jannifer Franklin she had her lumbar MRI yesterday and her thoracic this Friday 12/13.

## 2018-12-02 ENCOUNTER — Ambulatory Visit
Admission: RE | Admit: 2018-12-02 | Discharge: 2018-12-02 | Disposition: A | Discharge status: Home / Self Care | Payer: BLUE CROSS/BLUE SHIELD | Source: Ambulatory Visit | Attending: Neurological Surgery | Admitting: Neurological Surgery

## 2018-12-02 DIAGNOSIS — R29898 Other symptoms and signs involving the musculoskeletal system: Secondary | ICD-10-CM

## 2018-12-02 DIAGNOSIS — M47814 Spondylosis without myelopathy or radiculopathy, thoracic region: Secondary | ICD-10-CM | POA: Diagnosis not present

## 2018-12-03 ENCOUNTER — Other Ambulatory Visit: Payer: BLUE CROSS/BLUE SHIELD

## 2018-12-03 ENCOUNTER — Telehealth: Payer: Self-pay | Admitting: Neurology

## 2018-12-03 DIAGNOSIS — R202 Paresthesia of skin: Secondary | ICD-10-CM

## 2018-12-03 NOTE — Telephone Encounter (Signed)
I called the patient back.  The patient has had blood work testing for Lyme disease that was negative.  The standard for Lyme testing is consistent with the CDC recommendations nationally.  No indication for Western blot as the screening antibody was negative.  I do not believe that the urine test for Lyme disease meets the CDC requirement and recommendations.

## 2018-12-03 NOTE — Telephone Encounter (Signed)
Pt requesting a call stating she is wanting to know if Dr. Jannifer Franklin can place an order for a Urine lyme test done through [facility]. Also wanting to know if there is anyway possible to have her MRI's approved and sent to [facility] ASAP.

## 2018-12-03 NOTE — Telephone Encounter (Signed)
Pt has called to inform Dr Jannifer Franklin that she had her MRI at [facility] last night.

## 2018-12-03 NOTE — Addendum Note (Signed)
Addended by: Kathrynn Ducking on: 12/03/2018 10:39 AM   Modules accepted: Orders

## 2018-12-03 NOTE — Telephone Encounter (Signed)
I called patient.  The MRI of the thoracic spine is normal.  Because of her current symptoms are not clear.  We will check MRI of the brain with and without gadolinium, and cervical spine with gadolinium, consider lumbar puncture if no source of symptoms is noted.     MRI thoracic 12/03/18:  IMPRESSION: Normal MRI of the thoracic spine for patient age. No significant disc or facet pathology. No canal or neural foraminal stenosis. No impingement.

## 2018-12-05 ENCOUNTER — Other Ambulatory Visit: Payer: BLUE CROSS/BLUE SHIELD

## 2018-12-06 NOTE — Telephone Encounter (Signed)
I called the patient.  The MRI studies were done on the head and neck, the brain is with and without contrast, the neck is with contrast only.  We will have to therefore do the MRI of the brain first.  She has alprazolam to take if needed.

## 2018-12-06 NOTE — Telephone Encounter (Signed)
Patient called in stating she is extremely claustrophobic and she knows Dr. Jannifer Franklin gave her something to help her with it but since it is 2 exams she doesn't know if she has enough medicine to help her get through the whole thing since it can take up to about 2 hours... she also wants to know which exam is the most important for her to get done just incase.

## 2018-12-06 NOTE — Telephone Encounter (Signed)
[employer] Auth: [id number] (exp. 12/06/18 to 01/04/19) s  Spoke to the patient to get her scheduled her at [facility] this week she informed me that she prefer [facility] imaging due to the cost is cheaper. She is scheduled at [facility] for 12/12/18.

## 2018-12-08 ENCOUNTER — Encounter

## 2018-12-08 ENCOUNTER — Ambulatory Visit: Payer: BLUE CROSS/BLUE SHIELD | Admitting: Neurology

## 2018-12-10 ENCOUNTER — Telehealth: Payer: Self-pay | Admitting: Family Medicine

## 2018-12-10 NOTE — Telephone Encounter (Signed)
Please advise if sameday blocks can be used  Copied from [place] [id number]. Topic: General - Other >> Dec 10, 2018  7:39 AM Keene Breath wrote: Reason for CRM: Patient called to schedule an appointment with doctor Hunter on 12/20/18 or 12/21/18.  There are same day blocks.  Please call patient to see if she can be fit in on one of these days.  Patient specifically wants to see Dr. Yong Channel.  Please advise.  Patient's call back [phone]

## 2018-12-10 NOTE — Telephone Encounter (Signed)
Pt is calling back checking on getting an appt with dr hunter on 12/30 or 12/31

## 2018-12-12 ENCOUNTER — Other Ambulatory Visit: Payer: BLUE CROSS/BLUE SHIELD

## 2018-12-13 ENCOUNTER — Telehealth: Payer: Self-pay | Admitting: Neurology

## 2018-12-13 NOTE — Telephone Encounter (Addendum)
I contacted the patient. She stated two nights ago she woke up with a burning sensation radiating from her neck to her pelvic area. Patient stated she was bitten by a tick in July of 2019 and believes the burning sensation is related to this.   Patient has had a workup for Lyme disease and results were unremarkable (she is aware of these results).  Patient states she is currently taking 300 mg of gabapentin in the evening and she splits 100 mg in half or in thirds throughout the day depending on how she feels. Patient states she takes the 300 mg at bed time because it will put her to sleep but over the last two nights the burning sensation has woke her from sleeping.   Dr. Jannifer Franklin has ordered MRI of cervical spine and brain and patient is scheduled to have these studies completed on 12/17/2018. I advised Dr. Jannifer Franklin is currently out of the office and will return next week (12/20/2018), but we could schedule her for o/v visit on 12/23/2018 at 12 pm for her to discuss these symptoms and she was agreeable to this.  Patient also advised if symptoms worsen to seek care at the ED or urgent care. Pt verbalized understanding.  Patient wanted to know if any medication adjustments could be recommended to help with the burning sensation until her MRI results from 12/17/2018 are received and she is able to see Dr. Jannifer Franklin. I advised I would fwd message to the work in MD for further instruction. Patient voiced understanding.

## 2018-12-13 NOTE — Telephone Encounter (Signed)
Patient complains of burning on her back for the last 2 days and in her pelvic area. She has shooting pain down her feet and legs and feels like it is on fire.Please call to discuss. She states would like to be seen. I advised Dr. Jannifer Franklin is out of the office and will sent message to his nurse.

## 2018-12-13 NOTE — Telephone Encounter (Signed)
Patient notified we would wait for Dr. Jannifer Franklin to return to address message. Patient advised not to make any medication changes at this time. MB RN

## 2018-12-13 NOTE — Telephone Encounter (Signed)
Not an emergency - please address with dr Jannifer Franklin upon his return.

## 2018-12-16 NOTE — Telephone Encounter (Signed)
Thanks for keeping me in the loop- Im out of office this week- if she would like to see one of other providers this week- we can offer that. Glad she has MRI scheduled though so reasonable to wait on that and Dr. Jannifer Franklin' expert opinion.

## 2018-12-17 ENCOUNTER — Telehealth: Payer: Self-pay | Admitting: Neurology

## 2018-12-17 ENCOUNTER — Ambulatory Visit
Admission: RE | Admit: 2018-12-17 | Discharge: 2018-12-17 | Disposition: A | Discharge status: Home / Self Care | Payer: BLUE CROSS/BLUE SHIELD | Source: Ambulatory Visit | Attending: Neurology | Admitting: Neurology

## 2018-12-17 DIAGNOSIS — R202 Paresthesia of skin: Secondary | ICD-10-CM | POA: Diagnosis not present

## 2018-12-17 MED ORDER — GADOBENATE DIMEGLUMINE 529 MG/ML IV SOLN
10.0000 mL | Freq: Once | INTRAVENOUS | Status: AC | PRN
  Administered 2018-12-17: 10 mL via INTRAVENOUS

## 2018-12-17 NOTE — Telephone Encounter (Signed)
Pt requesting a call stating she is wanting to know if when she had blood work done for lyme diease if they also tested western blot. Please advise

## 2018-12-19 ENCOUNTER — Telehealth: Payer: Self-pay | Admitting: Neurology

## 2018-12-19 DIAGNOSIS — R202 Paresthesia of skin: Secondary | ICD-10-CM

## 2018-12-19 NOTE — Telephone Encounter (Signed)
  I called the patient.  MRI of the brain is normal, MRI of the cervical spine does not show spinal cord lesion.  The patient continues to have symptoms with paresthesias, she had EMG and nerve conduction study done in 2013 for similar symptoms involving the arms.  The patient is on gabapentin taking 100 mg 3 times daily, 300 mg at night.  I will get nerve conduction studies of both legs and one arm, EMG on one leg.  Prior EMG study in 2013 was normal.   The patient is convinced that she has Lyme disease.  Blood work studies were negative.  She may be getting an opinion through another office.  Her next scheduled revisit is on January 22, if we cannot get EMG study done before this, we will cancel the appointment.   MRI brain 12/17/18:  IMPRESSION: This is a normal MRI of the brain with and without contrast.    MRI cervical 12/17/18:  IMPRESSION: This MRI of the cervical spine with contrast shows the following: 1.   Mild cervical degenerative changes at C4-C5, C5-C6 and C6-C7 that do not lead to nerve root compression or spinal stenosis. 2.   The spinal cord appears normal. 3.   There is a normal enhancement pattern.

## 2018-12-19 NOTE — Telephone Encounter (Signed)
The results of the MRI studies were released on my chart to the patient.

## 2018-12-20 ENCOUNTER — Ambulatory Visit (INDEPENDENT_AMBULATORY_CARE_PROVIDER_SITE_OTHER): Payer: BLUE CROSS/BLUE SHIELD | Admitting: Family Medicine

## 2018-12-20 ENCOUNTER — Encounter: Payer: Self-pay | Admitting: Family Medicine

## 2018-12-20 VITALS — BP 124/70 | HR 80 | Temp 98.2°F | Ht 61.0 in | Wt 111.0 lb

## 2018-12-20 DIAGNOSIS — R202 Paresthesia of skin: Secondary | ICD-10-CM | POA: Diagnosis not present

## 2018-12-20 DIAGNOSIS — R29898 Other symptoms and signs involving the musculoskeletal system: Secondary | ICD-10-CM

## 2018-12-20 DIAGNOSIS — W57XXXD Bitten or stung by nonvenomous insect and other nonvenomous arthropods, subsequent encounter: Secondary | ICD-10-CM
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
## 2018-12-20 NOTE — Patient Instructions (Signed)
Health Maintenance Due  Topic Date Due  . PAP SMEAR-please have them send [place] most recent pap next time you see GYN 01/10/2019   Schedule a lab visit at the check out desk for tomorrow morning  I am sorry I do not have a clear answer at this point. As from last mychart message- I think we have to take this one step at a time- lets get the EMGs done and then proceed forward. I do think you may need additional specialist information such as from [facility] potentially depending on findings

## 2018-12-20 NOTE — Telephone Encounter (Signed)
I have already talked to the patient regarding this issue, she was tested for Lyme's disease, the test was negative.  The patient is still convinced that she may have Lyme disease.

## 2018-12-23 ENCOUNTER — Ambulatory Visit: Payer: Self-pay | Admitting: Neurology

## 2018-12-24 DIAGNOSIS — M2550 Pain in unspecified joint: Secondary | ICD-10-CM | POA: Diagnosis not present

## 2018-12-24 DIAGNOSIS — G629 Polyneuropathy, unspecified: Secondary | ICD-10-CM | POA: Diagnosis not present

## 2018-12-24 DIAGNOSIS — R5383 Other fatigue: Secondary | ICD-10-CM | POA: Diagnosis not present

## 2018-12-24 DIAGNOSIS — R251 Tremor, unspecified: Secondary | ICD-10-CM | POA: Diagnosis not present

## 2018-12-24 LAB — LYME DISEASE, WESTERN BLOT
IGG P45 AB.: ABSENT
IGG P58 AB.: ABSENT
IgG P18 Ab.: ABSENT
IgG P23 Ab.: ABSENT
IgG P28 Ab.: ABSENT
IgG P30 Ab.: ABSENT
IgG P39 Ab.: ABSENT
IgG P41 Ab.: ABSENT
IgG P66 Ab.: ABSENT
IgG P93 Ab.: ABSENT
IgM P23 Ab.: ABSENT
IgM P39 Ab.: ABSENT
IgM P41 Ab.: ABSENT
Lyme IgG Wb: NEGATIVE
Lyme IgM Wb: NEGATIVE

## 2018-12-31 ENCOUNTER — Encounter: Payer: Self-pay | Admitting: Family Medicine

## 2019-01-12 ENCOUNTER — Ambulatory Visit: Payer: BLUE CROSS/BLUE SHIELD | Admitting: Neurology

## 2019-01-19 ENCOUNTER — Other Ambulatory Visit (HOSPITAL_COMMUNITY)
Admission: RE | Admit: 2019-01-19 | Discharge: 2019-01-19 | Disposition: A | Discharge status: Home / Self Care | Payer: BLUE CROSS/BLUE SHIELD | Source: Ambulatory Visit | Attending: Certified Nurse Midwife | Admitting: Certified Nurse Midwife

## 2019-01-19 ENCOUNTER — Ambulatory Visit (INDEPENDENT_AMBULATORY_CARE_PROVIDER_SITE_OTHER): Payer: BLUE CROSS/BLUE SHIELD | Admitting: Certified Nurse Midwife

## 2019-01-19 ENCOUNTER — Encounter: Payer: Self-pay | Admitting: Certified Nurse Midwife

## 2019-01-19 VITALS — BP 126/74 | HR 68 | Resp 14 | Ht 61.0 in | Wt 107.0 lb

## 2019-01-19 DIAGNOSIS — N952 Postmenopausal atrophic vaginitis: Secondary | ICD-10-CM

## 2019-01-19 DIAGNOSIS — Z01419 Encounter for gynecological examination (general) (routine) without abnormal findings: Secondary | ICD-10-CM | POA: Diagnosis not present

## 2019-01-19 DIAGNOSIS — Z124 Encounter for screening for malignant neoplasm of cervix: Secondary | ICD-10-CM

## 2019-01-19 NOTE — Progress Notes (Signed)
63 y.o. G2P2002 Married  Caucasian Fe here for annual exam. Deer tick noted in 7/19 and started with neurological symptoms and seen by Neurology. Had negative Lyme tests. Seeing [facility] now, and being treated with supplements which appears to be helping. Still seeing Dr.Hunter for aex and labs also. Had cervical issues with neck and now continues follow up with as needed with orthopedic if needed. No other health issues today.  Patient's last menstrual period was 10/17/2009 (exact date).          Sexually active: Yes.    The current method of family planning is post menopausal status.    Exercising: Yes.    Walking Smoker:  no  Review of Systems  All other systems reviewed and are negative.   Health Maintenance: Pap:  01-11-16 neg HPV HR neg History of Abnormal Pap: yes MMG:  08-24-18 category c density birads 1:neg Self Breast exams: yes Colonoscopy:  2017 polyp f/u 29yrs BMD:   2018, osteopenia TDaP:  2016 Shingles: 2019 Pneumonia: none  Hep C and HIV: both neg 2019 Labs: PCP   reports that she has never smoked. She has never used smokeless tobacco. She reports that she does not drink alcohol or use drugs.  Past Medical History:  Diagnosis Date  . Abnormal Pap smear 06/2004   ASCUS, + HPV, CIN II LEEP  . Cervical radicular pain    managed by [facility]  . Hx of adenomatous polyp of colon 02/13/2016  . Hyperlipemia   . MITRAL VALVE PROLAPSE 08/05/2007   Qualifier: Diagnosis of  By: Tiney Rouge CMA, Ellison Hughs    . URINARY TRACT INFECTION, RECURRENT 12/06/2007   years ago.     Past Surgical History:  Procedure Laterality Date  . CARPAL TUNNEL RELEASE Right 08/2011  . CERVICAL BIOPSY  W/ LOOP ELECTRODE EXCISION  06/2004   CIN II  . COLPOSCOPY    . WISDOM TOOTH EXTRACTION  age 89    Current Outpatient Medications  Medication Sig Dispense Refill  . acyclovir (ZOVIRAX) 800 MG tablet TID X 5 DAYS PRN 28 tablet 2  . Cholecalciferol (VITAMIN D) 1000 UNITS capsule Take 1  capsule (1,000 Units total) by mouth 2 (two) times daily.    . Coenzyme Q10 (CO Q 10) 100 MG CAPS Take by mouth.    . gabapentin (NEURONTIN) 100 MG capsule Take 1 capsule (100 mg total) by mouth 3 (three) times daily. 90 capsule 3  . Glucosamine 750 MG TABS Take 2 tablets by mouth daily.    Marland Kitchen KRILL OIL 1000 MG CAPS Take 2 capsules (2,000 mg total) by mouth 2 (two) times daily.    Marland Kitchen latanoprost (XALATAN) 0.005 % ophthalmic solution   6  . lidocaine (LIDODERM) 5 % Place 1 patch onto the skin daily. Remove & Discard patch within 12 hours or as directed by MD 30 patch 1  . Probiotic Product (PROBIOTIC DAILY PO) Take by mouth daily.     No current facility-administered medications for this visit.     Family History  Problem Relation Age of Onset  . Heart disease Mother   . Diabetes Mother   . Alzheimer's disease Mother   . Stroke Sister        tia and pacer  . Atrial fibrillation Sister   . Dementia Sister        early  . Transient ischemic attack Sister   . Dementia Sister        severe emotional disturbance  . Heart  attack Father        age 12 died- but was smoker.   . Diabetes Maternal Grandmother   . Breast cancer Maternal Grandmother   . Diabetes Maternal Grandfather   . Colon cancer Sister        21    ROS:  Pertinent items are noted in HPI.  Otherwise, a comprehensive ROS was negative.  Exam:   LMP 10/17/2009 (Exact Date)    Ht Readings from Last 3 Encounters:  12/20/18 5\' 1"  (1.549 m)  11/29/18 5\' 1"  (1.549 m)  10/12/18 5\' 1"  (1.549 m)    General appearance: alert, cooperative and appears stated age Head: Normocephalic, without obvious abnormality, atraumatic Neck: no adenopathy, supple, symmetrical, trachea midline and thyroid normal to inspection and palpation Lungs: clear to auscultation bilaterally Breasts: normal appearance, no masses or tenderness, No nipple retraction or dimpling, No nipple discharge or bleeding, No axillary or supraclavicular  adenopathy Heart: regular rate and rhythm Abdomen: soft, non-tender; no masses,  no organomegaly Extremities: extremities normal, atraumatic, no cyanosis or edema Skin: Skin color, texture, turgor normal. No rashes or lesions Lymph nodes: Cervical, supraclavicular, and axillary nodes normal. No abnormal inguinal nodes palpated Neurologic: Grossly normal   Pelvic: External genitalia:  no lesions, atrophic appearance, with dryness noted              Urethra:  normal appearing urethra with no masses, tenderness or lesions              Bartholin's and Skene's: normal                 Vagina: atrophic  appearing vagina with normal color and scant  discharge, no lesions, dryness noted              Cervix: no cervical motion tenderness, no lesions and cervix flat into posterior vaginal wall              Pap taken: Yes.   Bimanual Exam:  Uterus:  normal size, contour, position, consistency, mobility, non-tender and mid position              Adnexa: normal adnexa and no mass, fullness, tenderness               Rectovaginal: Confirms               Anus:  normal sphincter tone, no lesions  Chaperone present: yes  A:  Well Woman with normal exam  Post menopausal no HRT  Atrophic vaginitis  Diagnosis of Lyme disease working with [facility] for treatment  Continues with PCP for aex/labs and Zovirax management.  Weight loss with illness and diet change.  BMD due, history of Osteopenia  P:   Reviewed health and wellness pertinent to exam  Aware of need to advise if vaginal bleeding  Discussed atrophy finding and using Olive or coconut oil for dryness discussed. Patient will start using twice weekly and advise if issues.  Continue follow up as indicated with MD's  Patient will advise once she has checked with insurance as to where this can be done for coverage, so order can be placed  Pap smear: yes   counseled on breast self exam, mammography screening, feminine hygiene, adequate  intake of calcium and vitamin D, diet and exercise  return annually or prn  An After Visit Summary was printed and given to the patient.

## 2019-01-19 NOTE — Patient Instructions (Signed)

## 2019-01-20 DIAGNOSIS — M47812 Spondylosis without myelopathy or radiculopathy, cervical region: Secondary | ICD-10-CM | POA: Diagnosis not present

## 2019-01-21 ENCOUNTER — Encounter: Payer: Self-pay | Admitting: Family Medicine

## 2019-01-21 LAB — CYTOLOGY - PAP
Diagnosis: NEGATIVE
HPV: NOT DETECTED

## 2019-01-24 ENCOUNTER — Encounter: Payer: BLUE CROSS/BLUE SHIELD | Admitting: Neurology

## 2019-01-26 DIAGNOSIS — B2790 Infectious mononucleosis, unspecified without complication: Secondary | ICD-10-CM | POA: Diagnosis not present

## 2019-01-26 DIAGNOSIS — R5383 Other fatigue: Secondary | ICD-10-CM | POA: Diagnosis not present

## 2019-01-26 DIAGNOSIS — B9711 Coxsackievirus as the cause of diseases classified elsewhere: Secondary | ICD-10-CM | POA: Diagnosis not present

## 2019-01-26 DIAGNOSIS — G629 Polyneuropathy, unspecified: Secondary | ICD-10-CM | POA: Diagnosis not present

## 2019-01-28 DIAGNOSIS — H40033 Anatomical narrow angle, bilateral: Secondary | ICD-10-CM | POA: Diagnosis not present

## 2019-01-28 DIAGNOSIS — H40051 Ocular hypertension, right eye: Secondary | ICD-10-CM | POA: Diagnosis not present

## 2019-01-28 DIAGNOSIS — H401131 Primary open-angle glaucoma, bilateral, mild stage: Secondary | ICD-10-CM | POA: Diagnosis not present

## 2019-01-28 DIAGNOSIS — H1013 Acute atopic conjunctivitis, bilateral: Secondary | ICD-10-CM | POA: Diagnosis not present

## 2019-02-09 ENCOUNTER — Encounter: Payer: BLUE CROSS/BLUE SHIELD | Admitting: Neurology

## 2019-03-02 DIAGNOSIS — E039 Hypothyroidism, unspecified: Secondary | ICD-10-CM | POA: Diagnosis not present

## 2019-03-02 DIAGNOSIS — R5383 Other fatigue: Secondary | ICD-10-CM | POA: Diagnosis not present

## 2019-03-02 DIAGNOSIS — N951 Menopausal and female climacteric states: Secondary | ICD-10-CM | POA: Diagnosis not present

## 2019-03-02 DIAGNOSIS — B2790 Infectious mononucleosis, unspecified without complication: Secondary | ICD-10-CM | POA: Diagnosis not present

## 2019-03-14 ENCOUNTER — Telehealth: Payer: Self-pay | Admitting: Neurology

## 2019-03-14 MED ORDER — GABAPENTIN 100 MG PO CAPS: 100.0000 mg | ORAL_CAPSULE | Freq: Three times a day (TID) | ORAL | 3 refills | Status: DC

## 2019-03-14 NOTE — Addendum Note (Signed)
Addended by: Verlin Grills T on: 03/14/2019 11:59 AM   Modules accepted: Orders

## 2019-03-14 NOTE — Telephone Encounter (Signed)
Chart reviewed and refill is appropriate. Rx submitted to [facility].

## 2019-03-14 NOTE — Telephone Encounter (Signed)
Pt has called for a refill on her gabapentin (NEURONTIN) 100 MG capsule [facility]

## 2019-03-30 DIAGNOSIS — H401130 Primary open-angle glaucoma, bilateral, stage unspecified: Secondary | ICD-10-CM | POA: Diagnosis not present

## 2019-03-30 DIAGNOSIS — H40051 Ocular hypertension, right eye: Secondary | ICD-10-CM | POA: Diagnosis not present

## 2019-03-30 DIAGNOSIS — H1045 Other chronic allergic conjunctivitis: Secondary | ICD-10-CM | POA: Diagnosis not present

## 2019-03-30 DIAGNOSIS — H40033 Anatomical narrow angle, bilateral: Secondary | ICD-10-CM | POA: Diagnosis not present

## 2019-04-11 ENCOUNTER — Encounter: Payer: Self-pay | Admitting: Family Medicine

## 2019-04-13 MED ORDER — GABAPENTIN 100 MG PO CAPS: ORAL_CAPSULE | ORAL | 0 refills | Status: DC

## 2019-04-20 DIAGNOSIS — E039 Hypothyroidism, unspecified: Secondary | ICD-10-CM | POA: Diagnosis not present

## 2019-04-20 DIAGNOSIS — H1045 Other chronic allergic conjunctivitis: Secondary | ICD-10-CM | POA: Diagnosis not present

## 2019-04-20 DIAGNOSIS — H401130 Primary open-angle glaucoma, bilateral, stage unspecified: Secondary | ICD-10-CM | POA: Diagnosis not present

## 2019-04-20 DIAGNOSIS — R5383 Other fatigue: Secondary | ICD-10-CM | POA: Diagnosis not present

## 2019-04-20 DIAGNOSIS — B2790 Infectious mononucleosis, unspecified without complication: Secondary | ICD-10-CM | POA: Diagnosis not present

## 2019-04-20 DIAGNOSIS — H40053 Ocular hypertension, bilateral: Secondary | ICD-10-CM | POA: Diagnosis not present

## 2019-04-20 DIAGNOSIS — H40033 Anatomical narrow angle, bilateral: Secondary | ICD-10-CM | POA: Diagnosis not present

## 2019-04-20 DIAGNOSIS — N951 Menopausal and female climacteric states: Secondary | ICD-10-CM | POA: Diagnosis not present

## 2019-04-20 DIAGNOSIS — F419 Anxiety disorder, unspecified: Secondary | ICD-10-CM | POA: Diagnosis not present

## 2019-04-25 ENCOUNTER — Encounter: Payer: BLUE CROSS/BLUE SHIELD | Admitting: Family Medicine

## 2019-05-02 DIAGNOSIS — H401111 Primary open-angle glaucoma, right eye, mild stage: Secondary | ICD-10-CM | POA: Diagnosis not present

## 2019-05-19 DIAGNOSIS — H401130 Primary open-angle glaucoma, bilateral, stage unspecified: Secondary | ICD-10-CM | POA: Diagnosis not present

## 2019-05-27 ENCOUNTER — Encounter: Payer: Self-pay | Admitting: Family Medicine

## 2019-05-27 ENCOUNTER — Other Ambulatory Visit: Payer: Self-pay

## 2019-05-27 ENCOUNTER — Ambulatory Visit (INDEPENDENT_AMBULATORY_CARE_PROVIDER_SITE_OTHER): Payer: BC Managed Care – PPO | Admitting: Family Medicine

## 2019-05-27 VITALS — BP 120/62 | HR 76 | Temp 98.6°F | Ht 61.0 in | Wt 105.6 lb

## 2019-05-27 DIAGNOSIS — Z0000 Encounter for general adult medical examination without abnormal findings: Secondary | ICD-10-CM | POA: Diagnosis not present

## 2019-05-27 DIAGNOSIS — E785 Hyperlipidemia, unspecified: Secondary | ICD-10-CM | POA: Diagnosis not present

## 2019-05-27 MED ORDER — GABAPENTIN 100 MG PO CAPS: ORAL_CAPSULE | ORAL | 2 refills | Status: DC

## 2019-05-27 NOTE — Patient Instructions (Addendum)
Thrilled you are doing so well! What an encouragement compared to where you were in December- really happy for you  Please stop by lab before you go If you do not have mychart- we will call you about results within 5 business days of [place] receiving them.  If you have mychart- we will send your results within 3 business days of [place] receiving them.  If abnormal or we want to clarify a result, we will call or mychart you to make sure you receive the message.  If you have questions or concerns or don't hear within 5-7 days, please send [place] a message or call us.

## 2019-05-27 NOTE — Progress Notes (Signed)
Phone: [phone]   Subjective:  Patient presents today for their annual physical. Chief complaint-noted.   See problem oriented charting- ROS- full  review of systems was completed and negative except for: still with some internal tremulousness  The following were reviewed and entered/updated in epic: Past Medical History:  Diagnosis Date  . Abnormal Pap smear 06/2004   ASCUS, + HPV, CIN II LEEP  . Cervical radicular pain    managed by [facility]  . Hx of adenomatous polyp of colon 02/13/2016  . Hyperlipemia   . MITRAL VALVE PROLAPSE 08/05/2007   Qualifier: Diagnosis of  By: Tiney Rouge CMA, Ellison Hughs    . URINARY TRACT INFECTION, RECURRENT 12/06/2007   years ago.    Patient Active Problem List   Diagnosis Date Noted  . Stenosis of cervical spine 07/18/2014    Priority: Medium  . Hyperlipidemia 08/05/2007    Priority: Medium  . Hx of adenomatous polyp of colon 02/13/2016    Priority: Low  . H/O cold sores 07/18/2014    Priority: Low  . Paraparesis ([place]) 09/16/2018  . Glaucoma 07/28/2018  . Cervical spondylosis without myelopathy 01/25/2014   Past Surgical History:  Procedure Laterality Date  . CARPAL TUNNEL RELEASE Right 08/2011  . CERVICAL BIOPSY  W/ LOOP ELECTRODE EXCISION  06/2004   CIN II  . COLPOSCOPY    . WISDOM TOOTH EXTRACTION  age 5    Family History  Problem Relation Age of Onset  . Heart disease Mother   . Diabetes Mother   . Alzheimer's disease Mother   . Stroke Sister        tia and pacer  . Atrial fibrillation Sister   . Dementia Sister        early  . Transient ischemic attack Sister   . Dementia Sister        severe emotional disturbance  . Heart attack Father        age 32 died- but was smoker.   . Diabetes Maternal Grandmother   . Breast cancer Maternal Grandmother   . Diabetes Maternal Grandfather   . Colon cancer Sister        63    Medications- reviewed and updated Current Outpatient Medications  Medication Sig Dispense Refill  .  acyclovir (ZOVIRAX) 800 MG tablet TID X 5 DAYS PRN 28 tablet 2  . Cholecalciferol (VITAMIN D) 1000 UNITS capsule Take 1 capsule (1,000 Units total) by mouth 2 (two) times daily.    . Coenzyme Q10 (CO Q 10) 100 MG CAPS Take by mouth.    . dorzolamide-timolol (COSOPT) 22.3-6.8 MG/ML ophthalmic solution 1 drop 2 (two) times daily.    . Estradiol-Progesterone 1-100 MG CAPS Take by mouth.    . gabapentin (NEURONTIN) 100 MG capsule take 100 mg at lunch and 300 mg at bedtime 360 capsule 2  . Glucosamine 750 MG TABS Take 2 tablets by mouth daily.    Marland Kitchen KRILL OIL 1000 MG CAPS Take 2 capsules (2,000 mg total) by mouth 2 (two) times daily.    Marland Kitchen latanoprost (XALATAN) 0.005 % ophthalmic solution   6  . Probiotic Product (PROBIOTIC DAILY PO) Take by mouth daily.    Marland Kitchen UNABLE TO FIND Low dose Neltrexone 150mg  xl    . UNABLE TO FIND 500 mg. Curcumin     No current facility-administered medications for this visit.     Allergies-reviewed and updated No Known Allergies  Social History   Social History Narrative   Married. 2 children. 2 grandkids  5 and 1.5 in 05/2017.       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 Oropharynx normal Neck: no thyromegaly CV: RRR no murmurs rubs or gallops Lungs: CTAB no crackles, wheeze, rhonchi Abdomen: soft/nontender/nondistended/normal bowel sounds. No rebound or guarding.  Ext: no edema Skin: warm, dry Neuro: grossly normal, moves all extremities, PERRLA   Assessment and Plan   63 y.o. female presenting for annual physical.  Health Maintenance counseling: 1. Anticipatory  guidance: Patient counseled regarding regular dental exams -q6 months, eye exams - now with glaucoma- has 2 eye drops- recently with laser eye surgery,  avoiding smoking and second hand smoke , limiting alcohol to 1 beverage per day .   2. Risk factor reduction:  Advised patient of need for regular exercise and diet rich and fruits and vegetables to reduce risk of heart attack and stroke. Exercise- walking regularly, some light weights. Diet-some major changes see discussion below- has led to weight loss (do not want further weight loss if possible but diet is restricted to help with her symptoms)  Wt Readings from Last 3 Encounters:  05/27/19 105 lb 9.6 oz (47.9 kg)  01/19/19 107 lb (48.5 kg)  12/20/18 111 lb (50.3 kg)  3. Immunizations/screenings/ancillary studies- up to date fully  Immunization History  Administered Date(s) Administered  . Influenza Split 10/14/2012  . Influenza Whole 10/26/2009  . Influenza,inj,Quad PF,6+ Mos 08/29/2013, 09/29/2017, 09/28/2018  . Influenza-Unspecified 09/21/2014, 09/05/2015, 09/02/2016  . Td 11/21/2006  . Tdap 10/23/2015  . Zoster 09/22/2016  . Zoster Recombinat (Shingrix) 04/21/2018, 06/30/2018  4. Cervical cancer screening- follows with GYN. Pap 12/2018 5. Breast cancer screening-  breast exam with gyn and mammogram 08/2018 6. Colon cancer screening -  01/2016 with 5 year repeat 7. Skin cancer screening- follows with Dr. Delman Cheadle. advised regular sunscreen use. Denies worrisome, changing, or new skin lesions.  8. Birth control/STD check- postmenopausal/monogomous 9. Osteoporosis screening at 77- had with GYN- 12/2016 with worst t score -1.1. plans to repeat with GYN.  10. never smoker  Status of chronic or acute concerns   # mild hyperlipidemia- takes krill oil. Will udpate lipids  # white coat elevated BP without hypertension- remains controlled at home- now in office with weight loss BP Readings from Last 3 Encounters:  05/27/19 120/62  01/19/19  126/74  12/20/18 124/70   # history cervical radiculopathy- managed by neurosurgery in the past.   # cold sores- lieks to have acyclovir on hand- was on valtrex through integrative health previously 1 g BID- just finished. 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 Some adrenal suppression was thought to contribute- on an otc adrenal supplement. Still some ups and downs- gets some tremulousness and gabapentin  still helps- 300 at night and 100 at lunchtime-im willing to refill for up to a year. Tingling in arm not present, numbness into feet not present. States more of an internal tremor feeling. Back to being able to walk 3 miles again.  - on a gluten free, sugar free, egg free, diary free - did a food profile and cant have bananas, pineapple, almond - has started oat milk along with her oatmeal -super clean diet in 6 months.  - focuses on blood type diet with integrative health - told patient I did not have a clear answer for internal tremulousness. Her integrative doctor states could be lyme symptom but has tested negative 3 so not likely.   # on 3 mg low dose naltrexone through integrative health- states for immune health  #vitamin d- #s have been fine from 40-60 in the past. Followed by integrative health right now  1 year cpe reasonable as doing well Future Appointments  Date Time Provider [place]  01/24/2020  3:00 PM Regina Eck, CNM [place] None   Lab/Order associations: has had breakfast and lunch  Preventative health care - Plan: Lipid panel, Comprehensive metabolic panel, CBC, CBC, Comprehensive metabolic panel, Lipid panel, CANCELED: CBC, CANCELED: Comprehensive metabolic panel, CANCELED: Lipid panel, CANCELED:  CBC, CANCELED: Comprehensive metabolic panel, CANCELED: Lipid panel  Hyperlipidemia, unspecified hyperlipidemia type - Plan: Lipid panel, Comprehensive metabolic panel, CBC, CBC, Comprehensive metabolic panel, Lipid panel, CANCELED: CBC, CANCELED: Comprehensive metabolic panel, CANCELED: Lipid panel, CANCELED: CBC, CANCELED: Comprehensive metabolic panel, CANCELED: Lipid panel  Meds ordered this encounter  Medications  . gabapentin (NEURONTIN) 100 MG capsule    Sig: take 100 mg at lunch and 300 mg at bedtime    Dispense:  360 capsule    Refill:  2   Return precautions advised.  Garret Reddish, MD

## 2019-05-28 LAB — COMPREHENSIVE METABOLIC PANEL
AG Ratio: 2.4 (calc) (ref 1.0–2.5)
ALT: 19 U/L (ref 6–29)
AST: 21 U/L (ref 10–35)
Albumin: 4.3 g/dL (ref 3.6–5.1)
Alkaline phosphatase (APISO): 32 U/L — ABNORMAL LOW (ref 37–153)
BUN: 15 mg/dL (ref 7–25)
CO2: 25 mmol/L (ref 20–32)
Calcium: 9 mg/dL (ref 8.6–10.4)
Chloride: 107 mmol/L (ref 98–110)
Creat: 0.76 mg/dL (ref 0.50–0.99)
Globulin: 1.8 g/dL (calc) — ABNORMAL LOW (ref 1.9–3.7)
Glucose, Bld: 91 mg/dL (ref 65–99)
Potassium: 4.1 mmol/L (ref 3.5–5.3)
Sodium: 141 mmol/L (ref 135–146)
Total Bilirubin: 0.6 mg/dL (ref 0.2–1.2)
Total Protein: 6.1 g/dL (ref 6.1–8.1)

## 2019-05-28 LAB — LIPID PANEL
Cholesterol: 227 mg/dL — ABNORMAL HIGH (ref <200)
HDL: 67 mg/dL (ref >=50)
LDL Cholesterol (Calc): 137 mg/dL (calc) — ABNORMAL HIGH
Non-HDL Cholesterol (Calc): 160 mg/dL (calc) — ABNORMAL HIGH (ref <130)
Total CHOL/HDL Ratio: 3.4 (calc) (ref <5.0)
Triglycerides: 111 mg/dL (ref <150)

## 2019-05-28 LAB — CBC
HCT: 40.3 % (ref 35.0–45.0)
Hemoglobin: 13.6 g/dL (ref 11.7–15.5)
MCH: 32.9 pg (ref 27.0–33.0)
MCHC: 33.7 g/dL (ref 32.0–36.0)
MCV: 97.6 fL (ref 80.0–100.0)
MPV: 10.9 fL (ref 7.5–12.5)
Platelets: 262 10*3/uL (ref 140–400)
RBC: 4.13 10*6/uL (ref 3.80–5.10)
RDW: 13.4 % (ref 11.0–15.0)
WBC: 6.2 10*3/uL (ref 3.8–10.8)

## 2019-06-30 ENCOUNTER — Ambulatory Visit: Payer: BLUE CROSS/BLUE SHIELD | Admitting: Neurology

## 2019-07-04 DIAGNOSIS — H35033 Hypertensive retinopathy, bilateral: Secondary | ICD-10-CM | POA: Diagnosis not present

## 2019-07-04 DIAGNOSIS — H43811 Vitreous degeneration, right eye: Secondary | ICD-10-CM | POA: Diagnosis not present

## 2019-07-04 DIAGNOSIS — H401130 Primary open-angle glaucoma, bilateral, stage unspecified: Secondary | ICD-10-CM | POA: Diagnosis not present

## 2019-07-04 DIAGNOSIS — D3131 Benign neoplasm of right choroid: Secondary | ICD-10-CM | POA: Diagnosis not present

## 2019-07-08 DIAGNOSIS — R5383 Other fatigue: Secondary | ICD-10-CM | POA: Diagnosis not present

## 2019-07-08 DIAGNOSIS — R05 Cough: Secondary | ICD-10-CM | POA: Diagnosis not present

## 2019-07-08 DIAGNOSIS — E559 Vitamin D deficiency, unspecified: Secondary | ICD-10-CM | POA: Diagnosis not present

## 2019-07-08 DIAGNOSIS — E782 Mixed hyperlipidemia: Secondary | ICD-10-CM | POA: Diagnosis not present

## 2019-07-08 DIAGNOSIS — E039 Hypothyroidism, unspecified: Secondary | ICD-10-CM | POA: Diagnosis not present

## 2019-07-08 DIAGNOSIS — N951 Menopausal and female climacteric states: Secondary | ICD-10-CM | POA: Diagnosis not present

## 2019-07-22 ENCOUNTER — Other Ambulatory Visit: Payer: Self-pay | Admitting: Family Medicine

## 2019-07-22 DIAGNOSIS — Z1231 Encounter for screening mammogram for malignant neoplasm of breast: Secondary | ICD-10-CM

## 2019-07-26 ENCOUNTER — Encounter: Payer: BLUE CROSS/BLUE SHIELD | Admitting: Family Medicine

## 2019-07-28 DIAGNOSIS — E039 Hypothyroidism, unspecified: Secondary | ICD-10-CM | POA: Diagnosis not present

## 2019-07-28 DIAGNOSIS — B2790 Infectious mononucleosis, unspecified without complication: Secondary | ICD-10-CM | POA: Diagnosis not present

## 2019-07-28 DIAGNOSIS — N951 Menopausal and female climacteric states: Secondary | ICD-10-CM | POA: Diagnosis not present

## 2019-07-28 DIAGNOSIS — R5383 Other fatigue: Secondary | ICD-10-CM | POA: Diagnosis not present

## 2019-08-04 DIAGNOSIS — L821 Other seborrheic keratosis: Secondary | ICD-10-CM | POA: Diagnosis not present

## 2019-08-04 DIAGNOSIS — L723 Sebaceous cyst: Secondary | ICD-10-CM | POA: Diagnosis not present

## 2019-08-04 DIAGNOSIS — D2261 Melanocytic nevi of right upper limb, including shoulder: Secondary | ICD-10-CM | POA: Diagnosis not present

## 2019-08-04 DIAGNOSIS — L80 Vitiligo: Secondary | ICD-10-CM | POA: Diagnosis not present

## 2019-08-05 ENCOUNTER — Encounter: Payer: Self-pay | Admitting: Family Medicine

## 2019-08-05 MED ORDER — DIAZEPAM 5 MG PO TABS: 5.0000 mg | ORAL_TABLET | Freq: Once | ORAL | 0 refills | Status: AC

## 2019-08-24 ENCOUNTER — Telehealth: Payer: Self-pay | Admitting: Certified Nurse Midwife

## 2019-08-24 DIAGNOSIS — Z13820 Encounter for screening for osteoporosis: Secondary | ICD-10-CM

## 2019-08-24 NOTE — Telephone Encounter (Signed)
Patient called for a bone density order to be faxed to [facility] and has a question about mammogram that's scheduled for Friday 9/4.

## 2019-08-24 NOTE — Telephone Encounter (Signed)
Call to patient. Patient requesting order for BMD to have done at [facility]. Order placed and pended for Cheyenne Myers, CNM to review and sign.   Patient also asking if she had a 3D MMG last year and if that is what is recommended? RN advised patient did have 3D MMG last year and it is recommended as it can provide a better view of breast tissue for radiologist, but is ultimately up to the patient. Patient verbalized understanding and appreciative of phone call.   Routing to provider for review.

## 2019-08-26 ENCOUNTER — Other Ambulatory Visit: Payer: Self-pay

## 2019-08-26 ENCOUNTER — Ambulatory Visit
Admission: RE | Admit: 2019-08-26 | Discharge: 2019-08-26 | Disposition: A | Discharge status: Home / Self Care | Payer: BC Managed Care – PPO | Source: Ambulatory Visit | Attending: Family Medicine | Admitting: Family Medicine

## 2019-08-26 DIAGNOSIS — Z1231 Encounter for screening mammogram for malignant neoplasm of breast: Secondary | ICD-10-CM | POA: Diagnosis not present

## 2019-09-05 ENCOUNTER — Ambulatory Visit: Payer: BC Managed Care – PPO

## 2019-10-12 ENCOUNTER — Encounter: Payer: Self-pay | Admitting: Family Medicine

## 2019-10-12 ENCOUNTER — Other Ambulatory Visit: Payer: Self-pay

## 2019-10-12 ENCOUNTER — Telehealth: Payer: Self-pay | Admitting: Certified Nurse Midwife

## 2019-10-12 DIAGNOSIS — Z0000 Encounter for general adult medical examination without abnormal findings: Secondary | ICD-10-CM

## 2019-10-12 NOTE — Telephone Encounter (Signed)
Patient would like to speak with nurse because she is trying to get bone density done at her pcp for a regular screening bone density and they are asking for a diagnosis.

## 2019-10-12 NOTE — Telephone Encounter (Signed)
Call to patient. Patient states she has an upcoming appointment for BMD at [facility]. Was told by practice that Dr. Sabra Heck could not order BMD as she is not a [place] MD. States her PCP will need to order, but asking about diagnosis. RN advised diagnosis should be 'screening for osteoporosis.' Patient verbalized understanding and will update PCP office.   Routing to provider and will close encounter.

## 2019-10-13 ENCOUNTER — Other Ambulatory Visit: Payer: Self-pay

## 2019-10-13 DIAGNOSIS — Z13820 Encounter for screening for osteoporosis: Secondary | ICD-10-CM

## 2019-10-13 DIAGNOSIS — Z780 Asymptomatic menopausal state: Secondary | ICD-10-CM

## 2019-10-24 ENCOUNTER — Ambulatory Visit (INDEPENDENT_AMBULATORY_CARE_PROVIDER_SITE_OTHER)
Admission: RE | Admit: 2019-10-24 | Discharge: 2019-10-24 | Disposition: A | Discharge status: Home / Self Care | Payer: BC Managed Care – PPO | Source: Ambulatory Visit | Attending: Family Medicine | Admitting: Family Medicine

## 2019-10-24 ENCOUNTER — Other Ambulatory Visit: Payer: Self-pay

## 2019-10-24 DIAGNOSIS — Z780 Asymptomatic menopausal state: Secondary | ICD-10-CM | POA: Diagnosis not present

## 2019-10-24 DIAGNOSIS — Z13820 Encounter for screening for osteoporosis: Secondary | ICD-10-CM

## 2019-10-28 DIAGNOSIS — Z780 Asymptomatic menopausal state: Secondary | ICD-10-CM | POA: Diagnosis not present

## 2019-10-29 ENCOUNTER — Encounter: Payer: Self-pay | Admitting: Family Medicine

## 2019-10-29 DIAGNOSIS — M85852 Other specified disorders of bone density and structure, left thigh: Secondary | ICD-10-CM | POA: Insufficient documentation

## 2019-10-29 NOTE — Progress Notes (Signed)
Your bone density/DEXA scan shows osteopenia with slight worsening from last check.  Would recommend 2 to 3-year repeat.  osteopenia is a stage between normal and osteoporosis. You do not need to take prescription medication at this point but I do recommend targetting good calcium intake through diet (ideally 1200mg ) + vitamin D daily (at least 1000 units per day)  As well as regular weight bearing exercise as able. Weight-bearing aerobic activities involve doing aerobic exercise on your feet, with your bones supporting your weight. Examples include walking, dancing, low-impact aerobics, elliptical training machines, stair climbing and gardening.

## 2019-10-31 DIAGNOSIS — H1045 Other chronic allergic conjunctivitis: Secondary | ICD-10-CM | POA: Diagnosis not present

## 2019-10-31 DIAGNOSIS — H0102A Squamous blepharitis right eye, upper and lower eyelids: Secondary | ICD-10-CM | POA: Diagnosis not present

## 2019-10-31 DIAGNOSIS — H40033 Anatomical narrow angle, bilateral: Secondary | ICD-10-CM | POA: Diagnosis not present

## 2019-10-31 DIAGNOSIS — H401130 Primary open-angle glaucoma, bilateral, stage unspecified: Secondary | ICD-10-CM | POA: Diagnosis not present

## 2019-11-03 DIAGNOSIS — F419 Anxiety disorder, unspecified: Secondary | ICD-10-CM | POA: Diagnosis not present

## 2019-11-03 DIAGNOSIS — G629 Polyneuropathy, unspecified: Secondary | ICD-10-CM | POA: Diagnosis not present

## 2019-11-03 DIAGNOSIS — R5383 Other fatigue: Secondary | ICD-10-CM | POA: Diagnosis not present

## 2019-11-03 DIAGNOSIS — E039 Hypothyroidism, unspecified: Secondary | ICD-10-CM | POA: Diagnosis not present

## 2019-12-02 ENCOUNTER — Ambulatory Visit: Payer: Self-pay | Admitting: *Deleted

## 2019-12-02 ENCOUNTER — Other Ambulatory Visit: Payer: Self-pay

## 2019-12-02 DIAGNOSIS — Z20822 Contact with and (suspected) exposure to covid-19: Secondary | ICD-10-CM

## 2019-12-02 NOTE — Telephone Encounter (Signed)
I returned her call regarding the need to be tested for COVID-19.    "I'm at the testing site now".     See triage notes.    Reason for Disposition . COVID-19 Testing, questions about  Answer Assessment - Initial Assessment Questions 1. COVID-19 DIAGNOSIS: "Who made your Coronavirus (COVID-19) diagnosis?" "Was it confirmed by a positive lab test?" If not diagnosed by a HCP, ask "Are there lots of cases (community spread) where you live?" (See public health department website, if unsure)     Our son that lives with [place] got tested and is positive.  He was tested on 11/28/2019.  He is not having symptoms but was exposed by a co-worker.   Should I and my husband be tested?   "Actually we are at the test site now".2. COVID-19 EXPOSURE: "Was there any known exposure to COVID before the symptoms began?" CDC Definition of close contact: within 6 feet (2 meters) for a total of 15 minutes or more over a 24-hour period.      Exposed to son who lives with her and tested positive for COVID-19. 3. ONSET: "When did the COVID-19 symptoms start?"      No symptoms. 4. WORST SYMPTOM: "What is your worst symptom?" (e.g., cough, fever, shortness of breath, muscle aches)     N/A 5. COUGH: "Do you have a cough?" If so, ask: "How bad is the cough?"       No 6. FEVER: "Do you have a fever?" If so, ask: "What is your temperature, how was it measured, and when did it start?"     No 7. RESPIRATORY STATUS: "Describe your breathing?" (e.g., shortness of breath, wheezing, unable to speak)      No symptoms 8. BETTER-SAME-WORSE: "Are you getting better, staying the same or getting worse compared to yesterday?"  If getting worse, ask, "In what way?"     N/A 9. HIGH RISK DISEASE: "Do you have any chronic medical problems?" (e.g., asthma, heart or lung disease, weak immune system, obesity, etc.)     No 10. PREGNANCY: "Is there any chance you are pregnant?" "When was your last menstrual period?"       N/A due to age 63. OTHER  SYMPTOMS: "Do you have any other symptoms?"  (e.g., chills, fatigue, headache, loss of smell or taste, muscle pain, sore throat; new loss of smell or taste especially support the diagnosis of COVID-19)       None of the above.  I let her know it was a good idea to be tested and I'm glad they are at the testing site now.   I let her know she and her husband need to quarantine for 14 days even if her test result comes back negative because she can still come down with the virus during that 14 days.   She was agreeable to this plan and verbalized understanding of the instructions.  Protocols used: CORONAVIRUS (COVID-19) DIAGNOSED OR SUSPECTED-A-AH

## 2019-12-03 LAB — NOVEL CORONAVIRUS, NAA: SARS-CoV-2, NAA: NOT DETECTED

## 2020-01-23 ENCOUNTER — Other Ambulatory Visit: Payer: Self-pay

## 2020-01-24 ENCOUNTER — Other Ambulatory Visit: Payer: Self-pay

## 2020-01-24 ENCOUNTER — Encounter: Payer: Self-pay | Admitting: Certified Nurse Midwife

## 2020-01-24 ENCOUNTER — Ambulatory Visit (INDEPENDENT_AMBULATORY_CARE_PROVIDER_SITE_OTHER): Payer: BC Managed Care – PPO | Admitting: Certified Nurse Midwife

## 2020-01-24 VITALS — BP 120/68 | HR 68 | Temp 97.5°F | Resp 16 | Ht 60.75 in | Wt 104.0 lb

## 2020-01-24 DIAGNOSIS — N951 Menopausal and female climacteric states: Secondary | ICD-10-CM | POA: Diagnosis not present

## 2020-01-24 DIAGNOSIS — Z01419 Encounter for gynecological examination (general) (routine) without abnormal findings: Secondary | ICD-10-CM

## 2020-01-24 DIAGNOSIS — R634 Abnormal weight loss: Secondary | ICD-10-CM

## 2020-01-24 DIAGNOSIS — M85852 Other specified disorders of bone density and structure, left thigh: Secondary | ICD-10-CM

## 2020-01-24 NOTE — Patient Instructions (Signed)

## 2020-01-24 NOTE — Progress Notes (Signed)
64 y.o. G27P2002 Married  Caucasian Fe here for annual exam. Menopausal no vaginal bleeding. Sees PCP for labs and [facility] for all medication and supplements. Patient struggling with dietary change and some ? Low glucose symptoms. Has discussed with provider, but continues with restrictive diet and weight loss. Considering consult with another provider. Working on regular exercise to maintain bone health. No other health issues today.  Patient's last menstrual period was 10/17/2009 (exact date).          Sexually active: No.  The current method of family planning is post menopausal status.    Exercising: Yes.    walking Smoker:  no  Review of Systems  Constitutional: Negative.   HENT: Negative.   Eyes: Negative.   Respiratory: Negative.   Cardiovascular: Negative.   Gastrointestinal: Negative.   Genitourinary: Negative.   Musculoskeletal: Negative.   Skin: Negative.   Neurological: Negative.   Endo/Heme/Allergies: Negative.   Psychiatric/Behavioral: Negative.     Health Maintenance: Pap:  01-11-16 neg HPV HR neg, 01-19-2019 neg HPV HR neg History of Abnormal Pap: yes MMG:  08-30-2019 category c density birads 1:neg Self Breast exams: no Colonoscopy:  2017 polyp f/u 72yrs BMD:   2020 due 2022 TDaP:  2016 Shingles: 2019 Pneumonia: none Hep C and HIV: both neg 2019 Labs: with MD   reports that she has never smoked. She has never used smokeless tobacco. She reports that she does not drink alcohol or use drugs.  Past Medical History:  Diagnosis Date  . Abnormal Pap smear 06/2004   ASCUS, + HPV, CIN II LEEP  . Cervical radicular pain    managed by [facility]  . Hx of adenomatous polyp of colon 02/13/2016  . Hyperlipemia   . MITRAL VALVE PROLAPSE 08/05/2007   Qualifier: Diagnosis of  By: Tiney Rouge CMA, Ellison Hughs    . URINARY TRACT INFECTION, RECURRENT 12/06/2007   years ago.     Past Surgical History:  Procedure Laterality Date  . CARPAL TUNNEL RELEASE Right 08/2011  .  CERVICAL BIOPSY  W/ LOOP ELECTRODE EXCISION  06/2004   CIN II  . COLPOSCOPY    . WISDOM TOOTH EXTRACTION  age 30    Current Outpatient Medications  Medication Sig Dispense Refill  . acyclovir (ZOVIRAX) 800 MG tablet TID X 5 DAYS PRN 28 tablet 2  . Cholecalciferol (VITAMIN D) 1000 UNITS capsule Take 1 capsule (1,000 Units total) by mouth 2 (two) times daily.    . Coenzyme Q10 (CO Q 10) 100 MG CAPS Take by mouth.    . dorzolamide-timolol (COSOPT) 22.3-6.8 MG/ML ophthalmic solution 1 drop 2 (two) times daily.    . Estradiol-Progesterone 1-100 MG CAPS Take by mouth.    . gabapentin (NEURONTIN) 100 MG capsule take 100 mg at lunch and 300 mg at bedtime 360 capsule 2  . Glucosamine 750 MG TABS Take 2 tablets by mouth daily.    Marland Kitchen KRILL OIL 1000 MG CAPS Take 2 capsules (2,000 mg total) by mouth 2 (two) times daily.    Marland Kitchen latanoprost (XALATAN) 0.005 % ophthalmic solution   6  . Probiotic Product (PROBIOTIC DAILY PO) Take by mouth daily.    Marland Kitchen UNABLE TO FIND Low dose Neltrexone 150mg  xl    . UNABLE TO FIND 500 mg. Curcumin     No current facility-administered medications for this visit.    Family History  Problem Relation Age of Onset  . Heart disease Mother   . Diabetes Mother   . Alzheimer's disease Mother   .  Stroke Sister        tia and pacer  . Atrial fibrillation Sister   . Dementia Sister        early  . Transient ischemic attack Sister   . Dementia Sister        severe emotional disturbance  . Heart attack Father        age 39 died- but was smoker.   . Diabetes Maternal Grandmother   . Breast cancer Maternal Grandmother   . Diabetes Maternal Grandfather   . Colon cancer Sister        7    ROS:  Pertinent items are noted in HPI.  Otherwise, a comprehensive ROS was negative.  Exam:   LMP 10/17/2009 (Exact Date)    Ht Readings from Last 3 Encounters:  05/27/19 5\' 1"  (1.549 m)  01/19/19 5\' 1"  (1.549 m)  12/20/18 5\' 1"  (1.549 m)    General appearance: alert, cooperative  and appears stated age Head: Normocephalic, without obvious abnormality, atraumatic Neck: no adenopathy, supple, symmetrical, trachea midline and thyroid normal to inspection and palpation Lungs: clear to auscultation bilaterally Breasts: normal appearance, no masses or tenderness, No nipple retraction or dimpling, No nipple discharge or bleeding, No axillary or supraclavicular adenopathy Heart: regular rate and rhythm Abdomen: soft, non-tender; no masses,  no organomegaly Extremities: extremities normal, atraumatic, no cyanosis or edema Skin: Skin color, texture, turgor normal. No rashes or lesions Lymph nodes: Cervical, supraclavicular, and axillary nodes normal. No abnormal inguinal nodes palpated Neurologic: Grossly normal   Pelvic: External genitalia:  no lesions              Urethra:  normal appearing urethra with no masses, tenderness or lesions              Bartholin's and Skene's: normal                 Vagina: normal appearing vagina with normal color and discharge, no lesions              Cervix: no cervical motion tenderness, no lesions and Leep appearance              Pap taken: No. Bimanual Exam:  Uterus:  normal size, contour, position, consistency, mobility, non-tender and anteverted              Adnexa: normal adnexa and no mass, fullness, tenderness               Rectovaginal: Confirms               Anus:  normal sphincter tone, no lesions  Chaperone present: yes  A:  Well Woman with normal exam  Post menopausal no HRT  History of osteopenia  Weight loss with restrictive mainly vegetarian diet.  History of abnormal pap smear with LEEP  P:   Reviewed health and wellness pertinent to exam  Aware of need to advise if vaginal bleeding  Discussed bone health importance with diet and exercise  Encouraged to discuss with current MD or seek another opinion regarding diet management.  Pap smear: no   counseled on breast self exam, mammography screening, feminine  hygiene, adequate intake of calcium and vitamin D, diet and exercise  return annually or prn  An After Visit Summary was printed and given to the patient.

## 2020-01-25 DIAGNOSIS — R7309 Other abnormal glucose: Secondary | ICD-10-CM | POA: Diagnosis not present

## 2020-01-25 DIAGNOSIS — R5383 Other fatigue: Secondary | ICD-10-CM | POA: Diagnosis not present

## 2020-01-25 DIAGNOSIS — E782 Mixed hyperlipidemia: Secondary | ICD-10-CM | POA: Diagnosis not present

## 2020-01-25 DIAGNOSIS — E559 Vitamin D deficiency, unspecified: Secondary | ICD-10-CM | POA: Diagnosis not present

## 2020-01-25 DIAGNOSIS — E7212 Methylenetetrahydrofolate reductase deficiency: Secondary | ICD-10-CM | POA: Diagnosis not present

## 2020-01-25 DIAGNOSIS — E039 Hypothyroidism, unspecified: Secondary | ICD-10-CM | POA: Diagnosis not present

## 2020-01-25 DIAGNOSIS — R05 Cough: Secondary | ICD-10-CM | POA: Diagnosis not present

## 2020-01-25 DIAGNOSIS — N951 Menopausal and female climacteric states: Secondary | ICD-10-CM | POA: Diagnosis not present

## 2020-01-30 DIAGNOSIS — H35033 Hypertensive retinopathy, bilateral: Secondary | ICD-10-CM | POA: Diagnosis not present

## 2020-01-30 DIAGNOSIS — H2513 Age-related nuclear cataract, bilateral: Secondary | ICD-10-CM | POA: Diagnosis not present

## 2020-01-30 DIAGNOSIS — H401131 Primary open-angle glaucoma, bilateral, mild stage: Secondary | ICD-10-CM | POA: Diagnosis not present

## 2020-01-30 DIAGNOSIS — H25013 Cortical age-related cataract, bilateral: Secondary | ICD-10-CM | POA: Diagnosis not present

## 2020-01-30 LAB — HM DIABETES EYE EXAM

## 2020-02-03 ENCOUNTER — Encounter: Payer: Self-pay | Admitting: Family Medicine

## 2020-02-03 MED ORDER — DIAZEPAM 5 MG PO TABS: 5.0000 mg | ORAL_TABLET | Freq: Two times a day (BID) | ORAL | 0 refills | Status: DC | PRN

## 2020-02-13 DIAGNOSIS — H04123 Dry eye syndrome of bilateral lacrimal glands: Secondary | ICD-10-CM | POA: Diagnosis not present

## 2020-02-13 DIAGNOSIS — H40053 Ocular hypertension, bilateral: Secondary | ICD-10-CM | POA: Diagnosis not present

## 2020-02-13 DIAGNOSIS — H401131 Primary open-angle glaucoma, bilateral, mild stage: Secondary | ICD-10-CM | POA: Diagnosis not present

## 2020-02-13 DIAGNOSIS — H40033 Anatomical narrow angle, bilateral: Secondary | ICD-10-CM | POA: Diagnosis not present

## 2020-03-06 ENCOUNTER — Encounter: Payer: Self-pay | Admitting: Family Medicine

## 2020-03-06 DIAGNOSIS — H401131 Primary open-angle glaucoma, bilateral, mild stage: Secondary | ICD-10-CM | POA: Diagnosis not present

## 2020-03-06 DIAGNOSIS — H2513 Age-related nuclear cataract, bilateral: Secondary | ICD-10-CM | POA: Diagnosis not present

## 2020-03-14 ENCOUNTER — Encounter: Payer: Self-pay | Admitting: Certified Nurse Midwife

## 2020-05-02 DIAGNOSIS — E039 Hypothyroidism, unspecified: Secondary | ICD-10-CM | POA: Diagnosis not present

## 2020-05-02 DIAGNOSIS — R5383 Other fatigue: Secondary | ICD-10-CM | POA: Diagnosis not present

## 2020-05-02 DIAGNOSIS — Z1589 Genetic susceptibility to other disease: Secondary | ICD-10-CM | POA: Diagnosis not present

## 2020-05-02 DIAGNOSIS — N951 Menopausal and female climacteric states: Secondary | ICD-10-CM | POA: Diagnosis not present

## 2020-05-14 DIAGNOSIS — Z1589 Genetic susceptibility to other disease: Secondary | ICD-10-CM | POA: Diagnosis not present

## 2020-05-14 DIAGNOSIS — R5383 Other fatigue: Secondary | ICD-10-CM | POA: Diagnosis not present

## 2020-05-14 DIAGNOSIS — E039 Hypothyroidism, unspecified: Secondary | ICD-10-CM | POA: Diagnosis not present

## 2020-05-14 DIAGNOSIS — N951 Menopausal and female climacteric states: Secondary | ICD-10-CM | POA: Diagnosis not present

## 2020-06-06 DIAGNOSIS — E039 Hypothyroidism, unspecified: Secondary | ICD-10-CM | POA: Diagnosis not present

## 2020-06-06 DIAGNOSIS — R5383 Other fatigue: Secondary | ICD-10-CM | POA: Diagnosis not present

## 2020-06-06 DIAGNOSIS — N951 Menopausal and female climacteric states: Secondary | ICD-10-CM | POA: Diagnosis not present

## 2020-06-06 DIAGNOSIS — E7212 Methylenetetrahydrofolate reductase deficiency: Secondary | ICD-10-CM | POA: Diagnosis not present

## 2020-07-04 ENCOUNTER — Telehealth: Payer: Self-pay | Admitting: Family Medicine

## 2020-07-04 DIAGNOSIS — E785 Hyperlipidemia, unspecified: Secondary | ICD-10-CM

## 2020-07-04 DIAGNOSIS — Z0000 Encounter for general adult medical examination without abnormal findings: Secondary | ICD-10-CM

## 2020-07-04 DIAGNOSIS — M85852 Other specified disorders of bone density and structure, left thigh: Secondary | ICD-10-CM

## 2020-07-04 NOTE — Telephone Encounter (Signed)
Ok to order labs? 

## 2020-07-04 NOTE — Telephone Encounter (Signed)
Patient is calling in asking if she can do her fasting labs before her appointment, states she does not want to fast until 940

## 2020-07-04 NOTE — Telephone Encounter (Signed)
I ordered standard labs based off her problem list

## 2020-07-04 NOTE — Addendum Note (Signed)
Addended by: Marin Olp on: 07/04/2020 04:50 PM   Modules accepted: Orders

## 2020-07-05 NOTE — Telephone Encounter (Signed)
Please call to make lab app

## 2020-07-05 NOTE — Telephone Encounter (Signed)
Patient has been scheduled

## 2020-07-05 NOTE — Telephone Encounter (Signed)
LVM to schedule labs

## 2020-07-06 ENCOUNTER — Encounter: Payer: Self-pay | Admitting: Family Medicine

## 2020-07-06 ENCOUNTER — Other Ambulatory Visit: Payer: Self-pay

## 2020-07-06 ENCOUNTER — Ambulatory Visit (INDEPENDENT_AMBULATORY_CARE_PROVIDER_SITE_OTHER): Payer: BC Managed Care – PPO | Admitting: Family Medicine

## 2020-07-06 ENCOUNTER — Other Ambulatory Visit (INDEPENDENT_AMBULATORY_CARE_PROVIDER_SITE_OTHER): Payer: BC Managed Care – PPO

## 2020-07-06 VITALS — BP 126/70 | HR 64 | Temp 98.0°F | Ht 61.0 in | Wt 105.2 lb

## 2020-07-06 DIAGNOSIS — Z8619 Personal history of other infectious and parasitic diseases: Secondary | ICD-10-CM

## 2020-07-06 DIAGNOSIS — Z0000 Encounter for general adult medical examination without abnormal findings: Secondary | ICD-10-CM

## 2020-07-06 DIAGNOSIS — Z86010 Personal history of colonic polyps: Secondary | ICD-10-CM

## 2020-07-06 DIAGNOSIS — M85852 Other specified disorders of bone density and structure, left thigh: Secondary | ICD-10-CM

## 2020-07-06 DIAGNOSIS — E785 Hyperlipidemia, unspecified: Secondary | ICD-10-CM | POA: Diagnosis not present

## 2020-07-06 DIAGNOSIS — M4802 Spinal stenosis, cervical region: Secondary | ICD-10-CM | POA: Diagnosis not present

## 2020-07-06 LAB — CBC WITH DIFFERENTIAL/PLATELET
Basophils Absolute: 0.1 10*3/uL (ref 0.0–0.1)
Basophils Relative: 1.2 % (ref 0.0–3.0)
Eosinophils Absolute: 0.1 10*3/uL (ref 0.0–0.7)
Eosinophils Relative: 2.4 % (ref 0.0–5.0)
HCT: 39.5 % (ref 36.0–46.0)
Hemoglobin: 13.4 g/dL (ref 12.0–15.0)
Lymphocytes Relative: 25.9 % (ref 12.0–46.0)
Lymphs Abs: 1.4 10*3/uL (ref 0.7–4.0)
MCHC: 33.8 g/dL (ref 30.0–36.0)
MCV: 93.3 fl (ref 78.0–100.0)
Monocytes Absolute: 0.4 10*3/uL (ref 0.1–1.0)
Monocytes Relative: 7.9 % (ref 3.0–12.0)
Neutro Abs: 3.5 10*3/uL (ref 1.4–7.7)
Neutrophils Relative %: 62.6 % (ref 43.0–77.0)
Platelets: 222 10*3/uL (ref 150.0–400.0)
RBC: 4.24 Mil/uL (ref 3.87–5.11)
RDW: 13 % (ref 11.5–15.5)
WBC: 5.5 10*3/uL (ref 4.0–10.5)

## 2020-07-06 LAB — COMPREHENSIVE METABOLIC PANEL
ALT: 14 U/L (ref 0–35)
AST: 19 U/L (ref 0–37)
Albumin: 4.3 g/dL (ref 3.5–5.2)
Alkaline Phosphatase: 30 U/L — ABNORMAL LOW (ref 39–117)
BUN: 12 mg/dL (ref 6–23)
CO2: 28 mEq/L (ref 19–32)
Calcium: 9 mg/dL (ref 8.4–10.5)
Chloride: 104 mEq/L (ref 96–112)
Creatinine, Ser: 0.74 mg/dL (ref 0.40–1.20)
GFR: 79.06 mL/min (ref >60.00)
Glucose, Bld: 87 mg/dL (ref 70–99)
Potassium: 3.8 mEq/L (ref 3.5–5.1)
Sodium: 140 mEq/L (ref 135–145)
Total Bilirubin: 0.9 mg/dL (ref 0.2–1.2)
Total Protein: 6.1 g/dL (ref 6.0–8.3)

## 2020-07-06 LAB — LIPID PANEL
Cholesterol: 204 mg/dL — ABNORMAL HIGH (ref 0–200)
HDL: 73.1 mg/dL (ref >39.00)
LDL Cholesterol: 116 mg/dL — ABNORMAL HIGH (ref 0–99)
NonHDL: 131.34
Total CHOL/HDL Ratio: 3
Triglycerides: 79 mg/dL (ref 0.0–149.0)
VLDL: 15.8 mg/dL (ref 0.0–40.0)

## 2020-07-06 LAB — VITAMIN D 25 HYDROXY (VIT D DEFICIENCY, FRACTURES): VITD: 62.32 ng/mL (ref 30.00–100.00)

## 2020-07-06 NOTE — Progress Notes (Signed)
Phone [phone]   Subjective:  Patient presents today for their annual physical. Chief complaint-noted.   See problem oriented charting- Review of Systems  Constitutional: Negative.   HENT: Negative.   Eyes:       Cataract Issues  Respiratory: Negative.   Cardiovascular: Negative.   Gastrointestinal: Negative.   Genitourinary: Negative.   Musculoskeletal: Negative.   Skin: Negative.   Neurological: Negative.   Endo/Heme/Allergies: Negative.   Psychiatric/Behavioral: Negative.    The following were reviewed and entered/updated in epic: Past Medical History:  Diagnosis Date  . Abnormal Pap smear 06/2004   ASCUS, + HPV, CIN II LEEP  . Cervical radicular pain    managed by [facility]  . [place] Patient infection   . Hx of adenomatous polyp of colon 02/13/2016  . Hyperlipemia   . MITRAL VALVE PROLAPSE 08/05/2007   Qualifier: Diagnosis of  By: Tiney Rouge CMA, Ellison Hughs    . URINARY TRACT INFECTION, RECURRENT 12/06/2007   years ago.    Patient Active Problem List   Diagnosis Date Noted  . Stenosis of cervical spine 07/18/2014    Priority: Medium  . Hyperlipidemia 08/05/2007    Priority: Medium  . Hx of adenomatous polyp of colon 02/13/2016    Priority: Low  . H/O cold sores 07/18/2014    Priority: Low  . Osteopenia of left femoral neck 10/29/2019  . Paraparesis ([place]) 09/16/2018  . Glaucoma 07/28/2018  . Cervical spondylosis without myelopathy 01/25/2014   Past Surgical History:  Procedure Laterality Date  . CARPAL TUNNEL RELEASE Right 08/2011  . CERVICAL BIOPSY  W/ LOOP ELECTRODE EXCISION  06/2004   CIN II  . COLPOSCOPY    . WISDOM TOOTH EXTRACTION  age 57    Family History  Problem Relation Age of Onset  . Heart disease Mother   . Diabetes Mother   . Alzheimer's disease Mother   . Stroke Sister        tia and pacer  . Atrial fibrillation Sister   . Dementia Sister        early  . Transient ischemic attack Sister   . Dementia Sister        severe emotional  disturbance  . Heart attack Father        age 50 died- but was smoker.   . Diabetes Maternal Grandmother   . Breast cancer Maternal Grandmother   . Diabetes Maternal Grandfather   . Colon cancer Sister        7    Medications- reviewed and updated Current Outpatient Medications  Medication Sig Dispense Refill  . acyclovir (ZOVIRAX) 800 MG tablet TID X 5 DAYS PRN 28 tablet 2  . Cholecalciferol (VITAMIN D) 1000 UNITS capsule Take 1 capsule (1,000 Units total) by mouth 2 (two) times daily. (Patient taking differently: Take 5,000 Units by mouth every other day. )    . Coenzyme Q10 (CO Q 10) 100 MG CAPS Take by mouth.    . diazepam (VALIUM) 5 MG tablet Take 1 tablet (5 mg total) by mouth every 12 (twelve) hours as needed for anxiety (With neuroimaging.  Do not drive for 12 hours after use). 2 tablet 0  . Glucosamine 750 MG TABS Take 2 tablets by mouth daily.    Marland Kitchen KRILL OIL 1000 MG CAPS Take 2 capsules (2,000 mg total) by mouth 2 (two) times daily.    Marland Kitchen latanoprost (XALATAN) 0.005 % ophthalmic solution   6  . Menaquinone-7 (VITAMIN K2 PO) Take by mouth every other day.     Marland Kitchen  Probiotic Product (PROBIOTIC DAILY PO) Take by mouth daily.    . Pyridoxine HCl (B-6) 50 MG TABS Take by mouth.    Marland Kitchen UNABLE TO FIND Low dose Neltrexone 150mg  xl    . UNABLE TO FIND 500 mg. Curcumin    . UNABLE TO FIND methlylated b12    . Pregnenolone Micronized (PREGNENOLONE PO) Take by mouth.     No current facility-administered medications for this visit.    Allergies-reviewed and updated No Known Allergies  Social History   Social History Narrative   Married. 2 children. 2 grandkids 5 and 1.5 in 05/2017.       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 TM normal. Bridge of nose normal. Eyelids normal.  Neck: no thyromegaly or cervical lymphadenopathy  CV: RRR no murmurs rubs or gallops Lungs: CTAB no crackles, wheeze, rhonchi Abdomen: soft/nontender/nondistended/normal bowel sounds. No rebound or guarding.  Ext: no edema Skin: warm, dry Neuro: grossly normal, moves all extremities, PERRLA   Assessment and Plan   64 y.o. female presenting for annual physical.  Health Maintenance counseling: 1. Anticipatory guidance: Patient counseled regarding regular dental exams q6 months, eye exams- regularly ,  avoiding smoking and second hand smoke , limiting alcohol to 1 beverage per day .  Never drinks 2. Risk factor reduction:  Advised patient of need for regular exercise and diet rich and fruits and vegetables to reduce risk of heart attack and stroke. Exercise- Walk daily for about 30 minutes on average. Diet-tries to eat healthy diet. .  Wt Readings from Last 3 Encounters:  07/06/20 105 lb 4 oz (47.7 kg)  01/24/20 104 lb (47.2 kg)  05/27/19 105 lb 9.6 oz (47.9 kg)  3. Immunizations/screenings/ancillary studies-fully up-to-date on vaccines other than covid 19- still considering as wants to travel. Some concern with EBV history. Got shingrix and 2 weeks later had tick bite and and grandson had coxsackievirus.   Immunization History  Administered Date(s) Administered  . Influenza Split 10/14/2012  . Influenza Whole 10/26/2009  . Influenza,inj,Quad PF,6+ Mos 08/29/2013, 09/29/2017, 09/28/2018  . Influenza-Unspecified 09/21/2014, 09/05/2015, 09/02/2016  . Td 11/21/2006  . Tdap 10/23/2015  . Zoster 09/22/2016  . Zoster Recombinat (Shingrix) 04/21/2018, 06/30/2018  4. Cervical cancer screening- followed by Dr. Sabra Heck.  Last Pap on file January 2020  5. Breast cancer screening-  breast exam done at GYN  and mammogram up to date due after 08/25/2020   6. Colon cancer screening - due 02/06/2021.  February 2017 with 5-year repeat planned  7. Skin cancer screening-  Followed by Dr. Delman Cheadle.   advised regular sunscreen use. Denies worrisome, changing, or new skin lesions. Has appointment with dermatology next month.  8. Birth control/STD check-monogamous/postmenopausal 9. Osteoporosis screening at 51- Had last year.  Follows with us-last bone density November 2020 with osteopenia. Takes vitamin D- weights bothers her neck.  -Never  smoker  Status of chronic or acute concerns   On estrogen cream (estradiol and estriol  through [facility]- biest) and progesterone 100mg  oral.decision was made for bone ealth and hitsory of memory issues in sisters. They did counseling on benefits/risks. Perhaps 2-3 months treatment so far.   # drooping eyelid  S:Has increased in the last 4 months. Has been seen by optometrist due to other eye conditions.  Muscles of face functioning normally- if lifts forehead this resolves- seems to have loose skin worse on left side A/P: not affecting vision yet- discussed if progresses may need oculoplastic surgery referral. Did encourage optho follow up. She has questionable glaucoma and they are wondering if cataract procedure will actually help the pressure.    #hyperlipidemiaS: Medication:takes krill oil. Lab Results  Component Value Date   CHOL 227 (H) 05/27/2019   HDL 67 05/27/2019   LDLCALC 137 (H) 05/27/2019   LDLDIRECT 134.3 03/01/2012   TRIG 111 05/27/2019   CHOLHDL 3.4 05/27/2019   A/P: Hopefully stable-updated lipid panel today  #[place] elevated blood pressure without diagnosis of hypertension S: medication: white coat elevated BP without hypertension- remains controlled at home- now in office with weight loss as well more recently Home readings #s: "fairly good" certainly less than 798/92 BP Readings from Last 3 Encounters:  07/06/20 126/70  01/24/20  120/68  05/27/19 120/62  A/P: Stable. Continue current medications.   # history cervical radiculopathy- managed by neurosurgery in the past.  S: stable recently- not on meds- off gabapentin A/P: doing well- continue to monitor.   # cold sores-  S:Likes to have acyclovir on hand- was on valtrex through [facility] previously 1 g BID- just finished. She does not need acyclovir refill right now-can refill for up to a year  # lipoma vs cyst on back-was to discuss with dermatology from last year - found out it was a cyst based off incidental finding on mri 2019- no recent changes.   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 Some adrenal suppression was thought to contribute- on an otc adrenal supplement in past now off. Internal tremulousness - 10 vials of desbio from [facility] and that helped- was specific to coxsackievirus- considering a repeat course as still has milder issues. Was able to come off gabapentin and didn't notice worsening.  - tingling in arm has resolved, numbness into feet resolved.  -shooting burning pain from spine now gone.  - on a gluten free, sugar free,  diary free. Did add eggs back - did a food profile and cant have bananas, pineapple, almond -super clean diet in 6 months.  - focuses on blood type diet with [facility]  #Vitamin D-followed by [facility]- we updated with labs today. On 5k units vitamin d every other day- also takes k2 Last vitamin D Lab Results  Component Value Date   VD25OH 40 01/12/2017     Recommended follow up:1 year physical Future Appointments  Date Time Provider [place]  06/20/2021  9:00 AM Megan Salon, MD [place] None   Lab/Order associations: fasting earlier a day   ICD-10-CM   1. Preventative health care  Z00.00   2. Hyperlipidemia, unspecified hyperlipidemia type  E78.5   3. Osteopenia of left femoral neck   M85.852   4. Stenosis of cervical spine  M48.02   5. Hx of adenomatous polyp of colon  Z86.010   6. H/O cold sores  Z86.19     No orders of the defined types were placed in this encounter.  Return precautions advised.  Garret Reddish, MD

## 2020-07-06 NOTE — Patient Instructions (Addendum)
Eye lid: there is a procedure to correct but it is normally not covered until it interferes with your vision. If it does start effecting your vision I would follow up with your eye doctor.     Blood pressure: looks good today in office. Continue to monitor at home. If over 140/90 constantly let our office know.   Cholesterol: we will check today.   Thank you for getting lab work done today we will get in touch on my chart or give you a call with results.    If you have any questions or concerns before your next scheduled appointment please give our office a call.         Recommended follow up: No follow-ups on file.

## 2020-07-12 ENCOUNTER — Other Ambulatory Visit: Payer: Self-pay | Admitting: Family Medicine

## 2020-07-12 DIAGNOSIS — Z1231 Encounter for screening mammogram for malignant neoplasm of breast: Secondary | ICD-10-CM

## 2020-07-23 DIAGNOSIS — R5383 Other fatigue: Secondary | ICD-10-CM | POA: Diagnosis not present

## 2020-07-23 DIAGNOSIS — R251 Tremor, unspecified: Secondary | ICD-10-CM | POA: Diagnosis not present

## 2020-07-23 DIAGNOSIS — B9711 Coxsackievirus as the cause of diseases classified elsewhere: Secondary | ICD-10-CM | POA: Diagnosis not present

## 2020-07-27 DIAGNOSIS — L821 Other seborrheic keratosis: Secondary | ICD-10-CM | POA: Diagnosis not present

## 2020-07-27 DIAGNOSIS — L570 Actinic keratosis: Secondary | ICD-10-CM | POA: Diagnosis not present

## 2020-07-27 DIAGNOSIS — H01119 Allergic dermatitis of unspecified eye, unspecified eyelid: Secondary | ICD-10-CM | POA: Diagnosis not present

## 2020-07-27 DIAGNOSIS — D225 Melanocytic nevi of trunk: Secondary | ICD-10-CM | POA: Diagnosis not present

## 2020-07-27 DIAGNOSIS — L309 Dermatitis, unspecified: Secondary | ICD-10-CM | POA: Diagnosis not present

## 2020-08-09 DIAGNOSIS — Z1589 Genetic susceptibility to other disease: Secondary | ICD-10-CM | POA: Diagnosis not present

## 2020-08-09 DIAGNOSIS — R251 Tremor, unspecified: Secondary | ICD-10-CM | POA: Diagnosis not present

## 2020-08-09 DIAGNOSIS — N951 Menopausal and female climacteric states: Secondary | ICD-10-CM | POA: Diagnosis not present

## 2020-08-09 DIAGNOSIS — R5383 Other fatigue: Secondary | ICD-10-CM | POA: Diagnosis not present

## 2020-08-28 ENCOUNTER — Ambulatory Visit: Payer: BC Managed Care – PPO

## 2020-10-05 ENCOUNTER — Other Ambulatory Visit: Payer: Self-pay

## 2020-10-05 ENCOUNTER — Ambulatory Visit
Admission: RE | Admit: 2020-10-05 | Discharge: 2020-10-05 | Disposition: A | Discharge status: Home / Self Care | Payer: BC Managed Care – PPO | Source: Ambulatory Visit | Attending: Family Medicine | Admitting: Family Medicine

## 2020-10-05 DIAGNOSIS — Z1231 Encounter for screening mammogram for malignant neoplasm of breast: Secondary | ICD-10-CM

## 2020-10-29 DIAGNOSIS — L309 Dermatitis, unspecified: Secondary | ICD-10-CM | POA: Diagnosis not present

## 2020-10-29 DIAGNOSIS — H524 Presbyopia: Secondary | ICD-10-CM | POA: Diagnosis not present

## 2020-10-29 DIAGNOSIS — H401131 Primary open-angle glaucoma, bilateral, mild stage: Secondary | ICD-10-CM | POA: Diagnosis not present

## 2020-10-29 DIAGNOSIS — H40033 Anatomical narrow angle, bilateral: Secondary | ICD-10-CM | POA: Diagnosis not present

## 2020-11-12 DIAGNOSIS — E538 Deficiency of other specified B group vitamins: Secondary | ICD-10-CM | POA: Diagnosis not present

## 2020-11-12 DIAGNOSIS — E039 Hypothyroidism, unspecified: Secondary | ICD-10-CM | POA: Diagnosis not present

## 2020-11-12 DIAGNOSIS — E559 Vitamin D deficiency, unspecified: Secondary | ICD-10-CM | POA: Diagnosis not present

## 2020-11-12 DIAGNOSIS — R5383 Other fatigue: Secondary | ICD-10-CM | POA: Diagnosis not present

## 2020-11-12 DIAGNOSIS — N951 Menopausal and female climacteric states: Secondary | ICD-10-CM | POA: Diagnosis not present

## 2020-11-26 DIAGNOSIS — R5383 Other fatigue: Secondary | ICD-10-CM | POA: Diagnosis not present

## 2020-11-26 DIAGNOSIS — M8580 Other specified disorders of bone density and structure, unspecified site: Secondary | ICD-10-CM | POA: Diagnosis not present

## 2020-11-26 DIAGNOSIS — E039 Hypothyroidism, unspecified: Secondary | ICD-10-CM | POA: Diagnosis not present

## 2020-11-26 DIAGNOSIS — N951 Menopausal and female climacteric states: Secondary | ICD-10-CM | POA: Diagnosis not present

## 2021-01-11 ENCOUNTER — Encounter: Payer: Self-pay | Admitting: Family Medicine

## 2021-01-25 ENCOUNTER — Ambulatory Visit: Payer: BC Managed Care – PPO | Admitting: Certified Nurse Midwife

## 2021-02-01 ENCOUNTER — Ambulatory Visit (HOSPITAL_BASED_OUTPATIENT_CLINIC_OR_DEPARTMENT_OTHER): Payer: BC Managed Care – PPO | Admitting: Obstetrics & Gynecology

## 2021-02-15 ENCOUNTER — Encounter (HOSPITAL_BASED_OUTPATIENT_CLINIC_OR_DEPARTMENT_OTHER): Payer: Self-pay | Admitting: Obstetrics & Gynecology

## 2021-02-15 ENCOUNTER — Ambulatory Visit (INDEPENDENT_AMBULATORY_CARE_PROVIDER_SITE_OTHER): Payer: BC Managed Care – PPO | Admitting: Obstetrics & Gynecology

## 2021-02-15 ENCOUNTER — Other Ambulatory Visit: Payer: Self-pay

## 2021-02-15 VITALS — BP 136/79 | HR 74 | Ht 60.75 in | Wt 103.8 lb

## 2021-02-15 DIAGNOSIS — Z9889 Other specified postprocedural states: Secondary | ICD-10-CM

## 2021-02-15 DIAGNOSIS — Z8619 Personal history of other infectious and parasitic diseases: Secondary | ICD-10-CM

## 2021-02-15 DIAGNOSIS — M85852 Other specified disorders of bone density and structure, left thigh: Secondary | ICD-10-CM

## 2021-02-15 DIAGNOSIS — Z800 Family history of malignant neoplasm of digestive organs: Secondary | ICD-10-CM

## 2021-02-15 DIAGNOSIS — Z01419 Encounter for gynecological examination (general) (routine) without abnormal findings: Secondary | ICD-10-CM

## 2021-02-15 DIAGNOSIS — Z86010 Personal history of colonic polyps: Secondary | ICD-10-CM | POA: Diagnosis not present

## 2021-02-15 MED ORDER — NONFORMULARY OR COMPOUNDED ITEM: 3 refills | Status: DC

## 2021-02-15 NOTE — Progress Notes (Signed)
65 y.o. G38P2002 Married White or Caucasian female here for annual exam.  Doing well.  Denies vaginal bleeding.  Has concerns about Alzheimer's due to family.  She has been involved at [facility] with studies related to cognitive function. Had PET scan last year with the study.  She is followed by [facility] and is on bio identical. HRT.    Patient's last menstrual period was 10/17/2009 (exact date).          Sexually active: No.  The current method of family planning is post menopausal status.    Exercising: Yes.   walking Smoker:  no  Health Maintenance: Pap:  01/19/2019 History of abnormal Pap:  Remote hx MMG:  10/05/2020 Colonoscopy:  02/07/2016, Dr. Carlean Purl.  Follow up due now.  Pt aware.   BMD:   10/24/2019 TDaP:  10/2015 Pneumonia vaccine(s):  Not indicated Shingrix:   completed Hep C testing: 11/2018 Screening Labs: Dr. Yong Channel, blood work done in July   reports that she has never smoked. She has never used smokeless tobacco. She reports that she does not drink alcohol and does not use drugs.  Past Medical History:  Diagnosis Date   Abnormal Pap smear 06/2004   ASCUS, + HPV, CIN II LEEP   Cervical radicular pain    managed by [facility]   Epstein Barr infection    Hx of adenomatous polyp of colon 02/13/2016   Hyperlipemia    MITRAL VALVE PROLAPSE 08/05/2007   Qualifier: Diagnosis of  By: Tiney Rouge CMA, Brent.[place]     URINARY TRACT INFECTION, RECURRENT 12/06/2007   years ago.     Past Surgical History:  Procedure Laterality Date   CARPAL TUNNEL RELEASE Right 08/2011   CERVICAL BIOPSY  W/ LOOP ELECTRODE EXCISION  06/2004   CIN II   COLPOSCOPY     WISDOM TOOTH EXTRACTION  age 26    Current Outpatient Medications  Medication Sig Dispense Refill   acyclovir (ZOVIRAX) 800 MG tablet TID X 5 DAYS PRN 28 tablet 2   Cholecalciferol (VITAMIN D3 PO) Take 5,000 Units by mouth.     Glucosamine 750 MG TABS Take 2 tablets by mouth daily.     KRILL OIL 1000 MG CAPS  Take 2 capsules (2,000 mg total) by mouth 2 (two) times daily.     latanoprost (XALATAN) 0.005 % ophthalmic solution   6   Menaquinone-7 (VITAMIN K2 PO) Take by mouth every other day.      Omega-3 1000 MG CAPS Take by mouth.     Pregnenolone Micronized (PREGNENOLONE PO) Take by mouth.     Probiotic Product (PROBIOTIC DAILY PO) Take by mouth daily.     PROGESTERONE MICRONIZED PO Take 100 mg by mouth.     Pyridoxine HCl (B-6) 50 MG TABS Take by mouth.     Ubiquinol 100 MG CAPS Take by mouth.     UNABLE TO FIND Low dose Neltrexone 3mg  xl     UNABLE TO FIND 500 mg. Curcumin     No current facility-administered medications for this visit.    Family History  Problem Relation Age of Onset   Heart disease Mother    Diabetes Mother    Alzheimer's disease Mother    Stroke Sister        tia and pacer   Atrial fibrillation Sister    Dementia Sister        early   Transient ischemic attack Sister    Dementia Sister  severe emotional disturbance   Heart attack Father        age 9 died- but was smoker.    Diabetes Maternal Grandmother    Breast cancer Maternal Grandmother    Diabetes Maternal Grandfather    Colon cancer Sister        34    Review of Systems  All other systems reviewed and are negative.   Exam:   BP 136/79    Pulse 74    Ht 5' 0.75" (1.543 m)    Wt 103 lb 12.8 oz (47.1 kg)    LMP 10/17/2009 (Exact Date)    SpO2 99%    BMI 19.77 kg/m   Height: 5' 0.75" (154.3 cm)  General appearance: alert, cooperative and appears stated age Head: Normocephalic, without obvious abnormality, atraumatic Neck: no adenopathy, supple, symmetrical, trachea midline and thyroid normal to inspection and palpation Lungs: clear to auscultation bilaterally Breasts: normal appearance, no masses or tenderness Heart: regular rate and rhythm Abdomen: soft, non-tender; bowel sounds normal; no masses,  no organomegaly Extremities: extremities normal, atraumatic, no  cyanosis or edema Skin: Skin color, texture, turgor normal. No rashes or lesions Lymph nodes: Cervical, supraclavicular, and axillary nodes normal. No abnormal inguinal nodes palpated Neurologic: Grossly normal   Pelvic: External genitalia:  no lesions              Urethra:  normal appearing urethra with no masses, tenderness or lesions              Bartholins and Skenes: normal                 Vagina: normal appearing vagina with normal color and discharge, no lesions              Cervix: no lesions              Pap taken: No. Bimanual Exam:  Uterus:  normal size, contour, position, consistency, mobility, non-tender              Adnexa: normal adnexa and no mass, fullness, tenderness               Rectovaginal: Confirms               Anus:  normal sphincter tone, no lesions  Chaperone, Britt Bottom, CMA, was present for exam.  Assessment/Plan: 1. Well woman exam with routine gynecological exam - pap with neg HR HPV 2021.  Not indicated today. - Colonoscopy due this year.  Pt aware - BMD 2020 - lab work and vaccines up to date - MMG 09/2020  2. History of loop electrical excision procedure (LEEP) - guidelines reviewed for when to stop pap smears  3. Hx of adenomatous polyp of colon - colonoscopy due this year  4. H/O cold sores - Does not need acyclovir refill  5. Osteopenia of left femoral neck  6. Family history of colon cancer  7.  On HRT

## 2021-05-20 ENCOUNTER — Encounter: Payer: Self-pay | Admitting: Internal Medicine

## 2021-05-23 ENCOUNTER — Encounter: Payer: Self-pay | Admitting: Internal Medicine

## 2021-06-20 ENCOUNTER — Ambulatory Visit: Payer: BC Managed Care – PPO | Admitting: Obstetrics & Gynecology

## 2021-07-10 ENCOUNTER — Encounter: Payer: BC Managed Care – PPO | Admitting: Family Medicine

## 2021-07-16 ENCOUNTER — Ambulatory Visit (INDEPENDENT_AMBULATORY_CARE_PROVIDER_SITE_OTHER): Payer: BC Managed Care – PPO | Admitting: Family Medicine

## 2021-07-16 ENCOUNTER — Encounter: Payer: Self-pay | Admitting: Family Medicine

## 2021-07-16 ENCOUNTER — Other Ambulatory Visit: Payer: Self-pay

## 2021-07-16 VITALS — BP 131/81 | HR 81 | Temp 98.3°F | Ht 61.0 in | Wt 104.8 lb

## 2021-07-16 DIAGNOSIS — E559 Vitamin D deficiency, unspecified: Secondary | ICD-10-CM | POA: Diagnosis not present

## 2021-07-16 DIAGNOSIS — E785 Hyperlipidemia, unspecified: Secondary | ICD-10-CM | POA: Diagnosis not present

## 2021-07-16 DIAGNOSIS — Z0000 Encounter for general adult medical examination without abnormal findings: Secondary | ICD-10-CM

## 2021-07-16 DIAGNOSIS — G8220 Paraplegia, unspecified: Secondary | ICD-10-CM

## 2021-07-16 LAB — CBC WITH DIFFERENTIAL/PLATELET
Basophils Absolute: 0 10*3/uL (ref 0.0–0.1)
Basophils Relative: 1.2 % (ref 0.0–3.0)
Eosinophils Absolute: 0 10*3/uL (ref 0.0–0.7)
Eosinophils Relative: 0.8 % (ref 0.0–5.0)
HCT: 42.5 % (ref 36.0–46.0)
Hemoglobin: 14.1 g/dL (ref 12.0–15.0)
Lymphocytes Relative: 23.4 % (ref 12.0–46.0)
Lymphs Abs: 1 10*3/uL (ref 0.7–4.0)
MCHC: 33.2 g/dL (ref 30.0–36.0)
MCV: 97.7 fl (ref 78.0–100.0)
Monocytes Absolute: 0.3 10*3/uL (ref 0.1–1.0)
Monocytes Relative: 8.2 % (ref 3.0–12.0)
Neutro Abs: 2.8 10*3/uL (ref 1.4–7.7)
Neutrophils Relative %: 66.4 % (ref 43.0–77.0)
Platelets: 243 10*3/uL (ref 150.0–400.0)
RBC: 4.36 Mil/uL (ref 3.87–5.11)
RDW: 16.3 % — ABNORMAL HIGH (ref 11.5–15.5)
WBC: 4.2 10*3/uL (ref 4.0–10.5)

## 2021-07-16 LAB — LIPID PANEL
Cholesterol: 249 mg/dL — ABNORMAL HIGH (ref 0–200)
HDL: 90.2 mg/dL (ref >39.00)
LDL Cholesterol: 150 mg/dL — ABNORMAL HIGH (ref 0–99)
NonHDL: 159.14
Total CHOL/HDL Ratio: 3
Triglycerides: 47 mg/dL (ref 0.0–149.0)
VLDL: 9.4 mg/dL (ref 0.0–40.0)

## 2021-07-16 LAB — COMPREHENSIVE METABOLIC PANEL
ALT: 16 U/L (ref 0–35)
AST: 22 U/L (ref 0–37)
Albumin: 4.4 g/dL (ref 3.5–5.2)
Alkaline Phosphatase: 21 U/L — ABNORMAL LOW (ref 39–117)
BUN: 19 mg/dL (ref 6–23)
CO2: 28 mEq/L (ref 19–32)
Calcium: 9.4 mg/dL (ref 8.4–10.5)
Chloride: 104 mEq/L (ref 96–112)
Creatinine, Ser: 0.79 mg/dL (ref 0.40–1.20)
GFR: 78.81 mL/min (ref >60.00)
Glucose, Bld: 99 mg/dL (ref 70–99)
Potassium: 4.5 mEq/L (ref 3.5–5.1)
Sodium: 140 mEq/L (ref 135–145)
Total Bilirubin: 0.7 mg/dL (ref 0.2–1.2)
Total Protein: 7 g/dL (ref 6.0–8.3)

## 2021-07-16 LAB — VITAMIN D 25 HYDROXY (VIT D DEFICIENCY, FRACTURES): VITD: 93.92 ng/mL (ref 30.00–100.00)

## 2021-07-16 NOTE — Progress Notes (Signed)
Phone [phone]   Subjective:  Patient presents today for their annual physical. Chief complaint-noted.   See problem oriented charting- ROS- full  review of systems was completed and negative except for: visual problems with glaucoma  The following were reviewed and entered/updated in epic: Past Medical History:  Diagnosis Date   Abnormal Pap smear 06/2004   ASCUS, + HPV, CIN II LEEP   Cervical radicular pain    managed by [facility]   Epstein Barr infection    Hx of adenomatous polyp of colon 02/13/2016   Hyperlipemia    MITRAL VALVE PROLAPSE 08/05/2007   Qualifier: Diagnosis of  By: Tiney Rouge CMA, Brent.[place]     URINARY TRACT INFECTION, RECURRENT 12/06/2007   years ago.    Patient Active Problem List   Diagnosis Date Noted   Glaucoma 07/28/2018    Priority: Medium   Stenosis of cervical spine 07/18/2014    Priority: Medium   Hyperlipidemia 08/05/2007    Priority: Medium   Hx of adenomatous polyp of colon 02/13/2016    Priority: Low   H/O cold sores 07/18/2014    Priority: Low   Osteopenia of left femoral neck 10/29/2019   Paraparesis ([place]) 09/16/2018   Cervical spondylosis without myelopathy 01/25/2014   Past Surgical History:  Procedure Laterality Date   CARPAL TUNNEL RELEASE Right 08/2011   CERVICAL BIOPSY  W/ LOOP ELECTRODE EXCISION  06/2004   CIN II   COLONOSCOPY     COLPOSCOPY     WISDOM TOOTH EXTRACTION  age 22    Family History  Problem Relation Age of Onset   Heart disease Mother    Diabetes Mother    Alzheimer's disease Mother    Stroke Sister        tia and pacer   Atrial fibrillation Sister    Dementia Sister        early   Transient ischemic attack Sister    Dementia Sister        severe emotional disturbance   Heart attack Father        age 27 died- but was smoker.    Diabetes Maternal Grandmother    Breast cancer Maternal Grandmother    Diabetes Maternal Grandfather    Colon cancer Sister        18    Medications- reviewed and  updated Current Outpatient Medications  Medication Sig Dispense Refill   Cholecalciferol (VITAMIN D3 PO) Take 5,000 Units by mouth.     Glucosamine 750 MG TABS Take 2 tablets by mouth daily.     KRILL OIL 1000 MG CAPS Take 2 capsules (2,000 mg total) by mouth 2 (two) times daily.     latanoprost (XALATAN) 0.005 % ophthalmic solution   6   Menaquinone-7 (VITAMIN K2 PO) Take by mouth every other day.      NONFORMULARY OR COMPOUNDED ITEM Biest 50/50 2 clocks 1/2 cc day 1 each 3   Omega-3 1000 MG CAPS Take by mouth.     Pregnenolone Micronized (PREGNENOLONE PO) Take by mouth.     Probiotic Product (PROBIOTIC DAILY PO) Take by mouth daily.     PROGESTERONE MICRONIZED PO Take 100 mg by mouth.     Pyridoxine HCl (B-6) 50 MG TABS Take by mouth.     Ubiquinol 100 MG CAPS Take by mouth.     UNABLE TO FIND Low dose Neltrexone '3mg'$  xl     UNABLE TO FIND 500 mg. Curcumin     valACYclovir (VALTREX) 1000 MG tablet Take  1,000 mg by mouth 2 (two) times daily.     acyclovir (ZOVIRAX) 800 MG tablet TID X 5 DAYS PRN (Patient not taking: Reported on 07/16/2021) 28 tablet 2   No current facility-administered medications for this visit.    Allergies-reviewed and updated No Known Allergies  Social History   Social History Narrative   Married. 2 children. 2 grandkids 5 and 1.5 in 05/2017.       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 Oropharynx normal Neck: no thyromegaly CV: RRR no murmurs rubs or gallops Lungs: CTAB no crackles, wheeze, rhonchi Abdomen: soft/nontender/nondistended/normal bowel  sounds. No rebound or guarding.  Ext: no edema Skin: warm, dry Neuro: grossly normal, moves all extremities, PERRLA   Assessment and Plan   65 y.o. female presenting for annual physical.  Health Maintenance counseling: 1. Anticipatory guidance: Patient counseled regarding regular dental exams -q6 months, eye exams -regularly more than yearly at this point due to glaucoma,  avoiding smoking and second hand smoke , limiting alcohol to 1 beverage per day- never drinks  .   2. Risk factor reduction:  Advised patient of need for regular exercise and diet rich and fruits and vegetables to reduce risk of heart attack and stroke. Exercise- walks daily for about 30 minutes on average last year now up to an hour. Diet-tries to eat healthy.  Weight largely stable over the last year.  Uses a blood type diet with integrative health due to food sensitivities such as bananas, pineapples, almonds.  We discussed slight weight gain would be helpful Wt Readings from Last 3 Encounters:  07/16/21 104 lb 12.8 oz (47.5 kg)  02/15/21 103 lb 12.8 oz (47.1 kg)  07/06/20 105 lb 4 oz (47.7 kg)  3. Immunizations/screenings/ancillary studies - up-to-date other than COVID-19 vaccination-she is still considering this-she has some concern with the EBV history - she is interested in Novavax especially to get back to travel- better option -will consider prevnar 20 next year- she is going to read/think abou thtis/discuss with integravie Immunization History  Administered Date(s) Administered   Influenza Split 10/14/2012   Influenza Whole 10/26/2009   Influenza,inj,Quad PF,6+ Mos 08/29/2013, 09/29/2017, 09/28/2018   Influenza-Unspecified 09/21/2014, 09/05/2015, 09/02/2016   Td 11/21/2006   Tdap 10/23/2015   Zoster Recombinat (Shingrix) 04/21/2018, 06/30/2018   Zoster, Live 09/22/2016  4. Cervical cancer screening- followed by Dr. Sabra Heck. Last Pap on file 01/19/2019 with 3-year repeat planned.  5. Breast cancer screening-   breast exam done at GYN and mammogram 10/05/2020 -continue annual checks ideally 6. Colon cancer screening - last 02/07/2016 with 5-year repeat planned - overdue since 02/06/2021-reminder letter was sent in June by Dr. Carlean Purl- she is scheduled for follow up 7. Skin cancer screening- followed by Dr. Delman Cheadle. advised regular sunscreen use. Denies worrisome, changing, or new skin lesions.   8. Birth control/STD check- monogamous/postmenopausal 9. Osteoporosis screening at 32- follows with [place] - last 10/24/2019 with osteopenia - takes vitamin D due weights bothering her neck -  3-year repeat planned. -never smoker  Status of chronic or acute concerns   #Social update-granddaughter age 68 with some cardiac issues noted back in January by my chart message-patient reports today reports everything turned out ok.   # Hyperlipidemia S: Medication:takes krill oil 1000 mg  and DHA Lab Results  Component Value Date   CHOL 204 (H) 07/06/2020   HDL 73.10 07/06/2020   LDLCALC 116 (H) 07/06/2020   LDLDIRECT 134.3 03/01/2012   TRIG 79.0 07/06/2020   CHOLHDL 3 07/06/2020   A/P: Hopefully stable - update 10-year ASCVD risk with lipids today.  Has been under 5% and would not start statin unless above 7.5%. also has done NMR profiles with integrative health   # White coat elevated blood pressure without diagnosis of hypertension S: medication: white coat elevated BP without hypertension - remains even lower at home - Home readings #s: 112/70s BP Readings from Last 3 Encounters:  07/16/21 131/81  02/15/21 136/79  07/06/20 126/70  A/P: Stable - well-controlled even in office today-continue without medication  # Vitamin D deficiency-  followed by integrative health -  was on 5k units of vitamin D every other day - also on k2  Last vitamin D Lab Results  Component Value Date   VD25OH 62.32 07/06/2020  A/P:   hopefully stable- update vitamin D today. Continue current meds for now   # Tested positive for  reactivated EBV - and coxsackievirus- integrative did place her on some supplements in the past - she found this helpful. Adrenal suppression was thought to be a contributor - was on an otc adrenal supplement in the past as well. Internal tremulousness - 10 vials of desbio from integrative health - found this helpful also - was specific to coxsackievirus. Considered a repeat course due to patient still having milder issues. She was able to come off of gabapentin - did not notice worsening .  -more recently, She was treated with high dose valtrex more recently 1.5 months- due to higher EBV #s and not going down. Also on lysine now  # Drooping Eyelid- reported 07/06/2020 - patient stated that during that time frame it has been ongoing in the past 4 months. Had been seen by optometrist for other eye conditions. Muscles of face were functioning normally - if she lifts her forehead it would resolve - possible loose skin but worse on left side. Her vision was not affected at the time - oculoplastic surgery referral was discussed if worsened. Encouraged an optho follow-up due to questionable glaucoma - they wondered if cataract surgery would help the pressure.  -stable recently  # History of Cervical Radiculopathy - managed by neurosurgery in the past. Patient was not on any medications - off gabapentin as well. Was stable on 07/06/2020 visit. Reports stable again day- has been on naltrexone with integrative and VERY helpful- no recent issues  # Cold Sores- Liked to have acyclovir on hand - she was on valtrex 1 g BID through integrative health in the past - no issues while on valtrex  # Lipoma vs Cyst on Back- planned to discuss with dermatology in 2020 - it was found to be a cyst based off incidental finding on MRI 2019 - no recent changes  #Hormone replacement-patient on estrogen cream biest through integrative health as well as progesterone orally-she is opted for this due to bone health concerns and history  of memory issues and her sisters.  She has been counseled on benefits and risks by integrative health. -  she isalso monitored by GYN Dr. Sabra Heck  #paraparesis- noted on right side thought related to neck. Mild weakness on right side estable- she is intentional about strengthening  Recommended follow up: No follow-ups on file. Future Appointments  Date Time Provider [place]  08/20/2021  8:30 AM [facility] PREVISIT RM 51 [facility]  09/03/2021  9:30 AM [name], MD [facility]  02/20/2022  9:15 AM Megan Salon, MD [facility]   Lab/Order associations: fasting   ICD-10-CM   1. Preventative health care  Z00.00     2. Hyperlipidemia, unspecified hyperlipidemia type  E78.5     3. Vitamin D deficiency  E55.9      No orders of the defined types were placed in this encounter.  I,Harris Phan,acting as a Education administrator for Garret Reddish, MD.,have documented all relevant documentation on the behalf of Garret Reddish, MD,as directed by  Garret Reddish, MD while in the presence of Garret Reddish, MD.  I, Garret Reddish, MD, have reviewed all documentation for this visit. The documentation on 07/16/21 for the exam, diagnosis, procedures, and orders are all accurate and complete.  Return precautions advised.  Garret Reddish, MD

## 2021-07-16 NOTE — Patient Instructions (Addendum)
Health Maintenance Due  Topic Date Due   COLONOSCOPY-scheduled.  02/06/2021   Please stop by lab before you go If you have mychart- we will send your results within 3 business days of [place] receiving them.  If you do not have mychart- we will call you about results within 5 business days of [place] receiving them.  *please also note that you will see labs on mychart as soon as they post. I will later go in and write notes on them- will say "notes from Dr. Yong Channel"  Plans for Prevnar 20/pneumonia shot next year- but please read about this and discuss with your integrative provider.  Reach out in November and we can order bone density. Once we have order in... Schedule your bone density test at check out desk. You may also call directly to X-ray at [phone] to schedule an appointment that is convenient for you.  - located [address] across the [address] from [place] - in the basement - you do need an appointment for the bone density tests.    Recommended follow up: Return in about 1 year (around 07/16/2022) for next physical.

## 2021-07-23 ENCOUNTER — Encounter: Payer: Self-pay | Admitting: Family Medicine

## 2021-07-23 NOTE — Telephone Encounter (Signed)
Please advsie

## 2021-08-15 ENCOUNTER — Telehealth: Payer: Self-pay

## 2021-08-15 NOTE — Telephone Encounter (Signed)
Patient states she just seen her integrated physician Dr. Cassie Freer  who suggested she needs to EKG and echocardiogram patient has appt with [facility] on 9/1 and is requesting a referral to dr.ross at [facility]  She states he would just like a referral and doesn't need to see dr.hunter if she does not have to please advise

## 2021-08-16 NOTE — Telephone Encounter (Signed)
Pt called back to check the status if she is able to get a referral for cardiology without being seen by Dr Yong Channel. Please Advise.

## 2021-08-16 NOTE — Telephone Encounter (Signed)
Anytime there is a request for a referral I need to know the reason for the request- why is the integrative doctor requesting the referral and those studies? Was the visit on 09/06/21  with me only for this or for other reasons?

## 2021-08-16 NOTE — Telephone Encounter (Signed)
Ok to place cardiology referral?

## 2021-08-19 NOTE — Telephone Encounter (Signed)
Ok sounds good- I am happy to take a listen - we can actually just get an echocardiogram done instead of referring her to cardiology if I hear the murmur

## 2021-08-19 NOTE — Telephone Encounter (Signed)
Tried calling back to make pt aware but phone just rang, no voicemail clicked on.

## 2021-08-19 NOTE — Telephone Encounter (Signed)
Called and spoke with pt and she states she saw her intergratvie doctor and she heard a murmur that had not been heard before. So she wanted pt to follow up with you (which is what the visit on 09/16 is for)  and that doctor suggested this visit with you so that you can listen and see if you hear a murmur and determine if pt needs to be referred to cardiology.

## 2021-08-20 ENCOUNTER — Other Ambulatory Visit: Payer: Self-pay

## 2021-08-20 ENCOUNTER — Ambulatory Visit (AMBULATORY_SURGERY_CENTER): Payer: BC Managed Care – PPO

## 2021-08-20 VITALS — Ht 61.0 in | Wt 106.0 lb

## 2021-08-20 DIAGNOSIS — Z800 Family history of malignant neoplasm of digestive organs: Secondary | ICD-10-CM

## 2021-08-20 DIAGNOSIS — Z86010 Personal history of colonic polyps: Secondary | ICD-10-CM

## 2021-08-20 NOTE — Progress Notes (Signed)
Patient's pre-visit was done today over the phone with the patient due to COVID-19 pandemic. Name,DOB and address verified. Insurance verified. Patient denies any allergies to Eggs and Soy. Patient denies any problems with anesthesia/sedation. Patient denies taking diet pills or blood thinners. No home Oxygen. Packet of Prep instructions mailed to patient including a copy of a consent form-pt is aware. Patient understands to call us back with any questions or concerns. Patient is aware of our care-partner policy and Covid-19 safety protocol.   EMMI education assigned to the patient for the procedure, sent to MyChart.   

## 2021-08-22 ENCOUNTER — Ambulatory Visit: Payer: BC Managed Care – PPO | Admitting: Family Medicine

## 2021-08-27 ENCOUNTER — Other Ambulatory Visit: Payer: Self-pay | Admitting: Family Medicine

## 2021-08-27 DIAGNOSIS — Z1231 Encounter for screening mammogram for malignant neoplasm of breast: Secondary | ICD-10-CM

## 2021-08-28 ENCOUNTER — Encounter: Payer: Self-pay | Admitting: Internal Medicine

## 2021-09-02 ENCOUNTER — Telehealth: Payer: Self-pay | Admitting: Internal Medicine

## 2021-09-02 NOTE — Progress Notes (Signed)
Phone [phone] In person visit   Subjective:   Cheyenne Myers is a 65 y.o. year old very pleasant female patient who presents for/with See problem oriented charting No chief complaint on file.   This visit occurred during the SARS-CoV-2 public health emergency.  Safety protocols were in place, including screening questions prior to the visit, additional usage of staff PPE, and extensive cleaning of exam room while observing appropriate contact time as indicated for disinfecting solutions.   Past Medical History-  Patient Active Problem List   Diagnosis Date Noted   Osteopenia of left femoral neck 10/29/2019    Priority: Medium   Glaucoma 07/28/2018    Priority: Medium   Stenosis of cervical spine 07/18/2014    Priority: Medium   Hyperlipidemia 08/05/2007    Priority: Medium   Hx of adenomatous polyp of colon 02/13/2016    Priority: Low   H/O cold sores 07/18/2014    Priority: Low   Paraparesis ([place]) 09/16/2018   Cervical spondylosis without myelopathy 01/25/2014    Medications- reviewed and updated Current Outpatient Medications  Medication Sig Dispense Refill   acyclovir (ZOVIRAX) 800 MG tablet TID X 5 DAYS PRN 28 tablet 2   Cholecalciferol (VITAMIN D3 PO) Take 5,000 Units by mouth.     Glucosamine 750 MG TABS Take 2 tablets by mouth daily.     KRILL OIL 1000 MG CAPS Take 2 capsules (2,000 mg total) by mouth 2 (two) times daily.     latanoprost (XALATAN) 0.005 % ophthalmic solution   6   Menaquinone-7 (VITAMIN K2 PO) Take by mouth every other day.      NONFORMULARY OR COMPOUNDED ITEM Biest 50/50 2 clocks 1/2 cc day 1 each 3   Omega-3 1000 MG CAPS Take by mouth.     Pregnenolone Micronized (PREGNENOLONE PO) Take by mouth.     Probiotic Product (PROBIOTIC DAILY PO) Take by mouth daily.     PROGESTERONE MICRONIZED PO Take 100 mg by mouth.     Pyridoxine HCl (B-6) 50 MG TABS Take by mouth.     Ubiquinol 100 MG CAPS Take by mouth.     UNABLE TO FIND Low dose Neltrexone  '3mg'$  xl     UNABLE TO FIND 500 mg. Curcumin     valACYclovir (VALTREX) 1000 MG tablet Take 1,000 mg by mouth 2 (two) times daily. (Patient not taking: Reported on 09/06/2021)     No current facility-administered medications for this visit.     Objective:  BP 130/80 Comment: retake in the office  Pulse 66   Temp 97.9 F (36.6 C) (Temporal)   Ht '5\' 1"'$  (1.549 m)   Wt 106 lb 3.2 oz (48.2 kg)   LMP 10/17/2009 (Exact Date)   SpO2 99%   BMI 20.07 kg/m  Gen: NAD, resting comfortably CV: RRR no murmurs  on my examrubs or gallops Lungs: CTAB no crackles, wheeze, rhonchi Ext: no edema Skin: warm, dry    Assessment and Plan    #Mitral valve prolapse? S: Patient with history of mitral valve prolapse.  No echocardiogram on file. Patient states was diagnosed based off of murmur on exam with Dr. Arnoldo Morale  Patient recently saw her integrative physician who recommended EKG and echocardiogram based on hearing murmur- also recommended cardiology visit to do these.   A/P: I do not hear a murmur today but this has been heard previously by 2 separate physicians Dr. Arnoldo Morale and her integrative physician.  She reports not previously having echocardiogram to  confirm diagnosis.  We will order this today.  She is not having palpitations or chest pain-I think EKG would be low yield.  I do not think she has to see cardiology unless there were significant valvular issue-even mild mitral valve prolapse would not require follow-up since she is asymptomatic.   # White coat elevated blood pressure without diagnosis of hypertension S: medication: white coat elevated BP without hypertension - remained even lower at home - Home readings #s: 115/70 BP Readings from Last 3 Encounters:  09/06/21 130/80  09/03/21 114/68  07/16/21 131/81  A/P: well controlled on repeat- home well controlled #s- continue without medicine- simply white coat hypertension  # Potential travel-patient may take a cruise in December.  She will  be 65 at that time and is unvaccinated for COVID-19.  We discussed a prophylactic regimen of paxlovid in this case.  She will reach out around the time of travel and I can prescribe this. - normal GFR Counseled on most common side effects and potential risk for rebound-also mentioned there is a possibility they will extend duration from 5 days. Recommended follow up: No follow-ups on file. Future Appointments  Date Time Provider [place]  10/07/2021 12:40 PM [facility] MM 3 [facility]  02/20/2022  9:15 AM Megan Salon, MD [facility]  07/18/2022  8:00 AM Yong Channel Brayton Mars, MD [facility]    Lab/Order associations:   ICD-10-CM   1. Mitral valve prolapse  I34.1 ECHOCARDIOGRAM COMPLETE    2. White coat syndrome with hypertension  I10       Time Spent: 20 minutes of total time (8:50 AM- 9:10 AM) was spent on the date of the encounter performing the following actions: chart review prior to seeing the patient, obtaining history, performing a medically necessary exam, counseling on the treatment plan, placing orders, and documenting in our EHR.   I,Jada Bradford,acting as a scribe for Garret Reddish, MD.,have documented all relevant documentation on the behalf of Garret Reddish, MD,as directed by  Garret Reddish, MD while in the presence of Garret Reddish, MD.  I, Garret Reddish, MD, have reviewed all documentation for this visit. The documentation on 09/06/21 for the exam, diagnosis, procedures, and orders are all accurate and complete.  Return precautions advised.  Garret Reddish, MD

## 2021-09-02 NOTE — Telephone Encounter (Signed)
GI on call note  Patient, for colonoscopy tomorrow with [facility], called reporting vomiting shortly after taking Ducolax. Subsequently, completed first Miralax/gatorade session without problems but was concerned that she hasn't had a BM. Otherwise feels fine. Told to take an additional 2 Ducolax this evenning and complete second half of Miralx prep as directed.  Docia Chuck. Geri Seminole., M.D. [facility] Division of Gastroenterology

## 2021-09-03 ENCOUNTER — Other Ambulatory Visit: Payer: Self-pay

## 2021-09-03 ENCOUNTER — Encounter: Payer: Self-pay | Admitting: Internal Medicine

## 2021-09-03 ENCOUNTER — Ambulatory Visit (AMBULATORY_SURGERY_CENTER): Payer: PPO | Admitting: Internal Medicine

## 2021-09-03 VITALS — BP 114/68 | HR 57 | Temp 97.8°F | Resp 13 | Ht 61.0 in | Wt 106.0 lb

## 2021-09-03 DIAGNOSIS — Z86010 Personal history of colonic polyps: Secondary | ICD-10-CM

## 2021-09-03 DIAGNOSIS — Z800 Family history of malignant neoplasm of digestive organs: Secondary | ICD-10-CM | POA: Diagnosis not present

## 2021-09-03 MED ORDER — SODIUM CHLORIDE 0.9 % IV SOLN: 500.0000 mL | Freq: Once | INTRAVENOUS | Status: DC

## 2021-09-03 NOTE — Patient Instructions (Addendum)
I did not see any polyps today.  Your hemorrhoids were inflamed and swollen - that happens often with the prep.  Will consider repeating a routine colonoscopy in 5 years given your history of having a polyp and your sister's history of colon cancer.  Enjoy your birthday next week!  I appreciate the opportunity to care for you. [name], MD, FACG    YOU HAD AN ENDOSCOPIC PROCEDURE TODAY AT [facility]:   Refer to the procedure report that was given to you for any specific questions about what was found during the examination.  If the procedure report does not answer your questions, please call your gastroenterologist to clarify.  If you requested that your care partner not be given the details of your procedure findings, then the procedure report has been included in a sealed envelope for you to review at your convenience later.  YOU SHOULD EXPECT: Some feelings of bloating in the abdomen. Passage of more gas than usual.  Walking can help get rid of the air that was put into your GI tract during the procedure and reduce the bloating. If you had a lower endoscopy (such as a colonoscopy or flexible sigmoidoscopy) you may notice spotting of blood in your stool or on the toilet paper. If you underwent a bowel prep for your procedure, you may not have a normal bowel movement for a few days.  Please Note:  You might notice some irritation and congestion in your nose or some drainage.  This is from the oxygen used during your procedure.  There is no need for concern and it should clear up in a day or so.  SYMPTOMS TO REPORT IMMEDIATELY:  Following lower endoscopy (colonoscopy or flexible sigmoidoscopy):  Excessive amounts of blood in the stool  Significant tenderness or worsening of abdominal pains  Swelling of the abdomen that is new, acute  Fever of 100F or higher   For urgent or emergent issues, a gastroenterologist can be reached at any hour by calling [phone]  [phone]. Do not use MyChart messaging for urgent concerns.    DIET:  We do recommend a small meal at first, but then you may proceed to your regular diet.  Drink plenty of fluids but you should avoid alcoholic beverages for 24 hours.  ACTIVITY:  You should plan to take it easy for the rest of today and you should NOT DRIVE or use heavy machinery until tomorrow (because of the sedation medicines used during the test).    FOLLOW UP: Our staff will call the number listed on your records 48-72 hours following your procedure to check on you and address any questions or concerns that you may have regarding the information given to you following your procedure. If we do not reach you, we will leave a message.  We will attempt to reach you two times.  During this call, we will ask if you have developed any symptoms of COVID 19. If you develop any symptoms (ie: fever, flu-like symptoms, shortness of breath, cough etc.) before then, please call [phone].  If you test positive for Covid 19 in the 2 weeks post procedure, please call and report this information to [place].    If any biopsies were taken you will be contacted by phone or by letter within the next 1-3 weeks.  Please call us at [phone] if you have not heard about the biopsies in 3 weeks.    SIGNATURES/CONFIDENTIALITY: You and/or your care partner have signed  paperwork which will be entered into your electronic medical record.  These signatures attest to the fact that that the information above on your After Visit Summary has been reviewed and is understood.  Full responsibility of the confidentiality of this discharge information lies with you and/or your care-partner.    Resume medications. Information given on hemorrhoids.

## 2021-09-03 NOTE — Progress Notes (Signed)
Report given to PACU, vss 

## 2021-09-03 NOTE — Op Note (Signed)
[facility] Patient Name: Cheyenne Myers Procedure Date: 09/03/2021 9:59 AM MRN: [id number] Endoscopist: [name] , MD Age: 65 Referring MD:  Date of Birth: [birth year] Gender: Female Account #: [id number] Procedure:                Colonoscopy Indications:              Surveillance: Personal history of adenomatous                            polyps on last colonoscopy 5 years ago Medicines:                Propofol per Anesthesia, Monitored Anesthesia Care Procedure:                Pre-Anesthesia Assessment:                           - Prior to the procedure, a History and Physical                            was performed, and patient medications and                            allergies were reviewed. The patient's tolerance of                            previous anesthesia was also reviewed. The risks                            and benefits of the procedure and the sedation                            options and risks were discussed with the patient.                            All questions were answered, and informed consent                            was obtained. Prior Anticoagulants: The patient has                            taken no previous anticoagulant or antiplatelet                            agents. ASA Grade Assessment: II - A patient with                            mild systemic disease. After reviewing the risks                            and benefits, the patient was deemed in                            satisfactory condition to undergo the procedure.  After obtaining informed consent, the colonoscope                            was passed under direct vision. Throughout the                            procedure, the patient's blood pressure, pulse, and                            oxygen saturations were monitored continuously. The                            PCF-HQ190L Colonoscope was introduced through the                            anus  and advanced to the the cecum, identified by                            appendiceal orifice and ileocecal valve. The                            colonoscopy was performed without difficulty. The                            patient tolerated the procedure well. The quality                            of the bowel preparation was good. The bowel                            preparation used was Miralax via split dose                            instruction. The ileocecal valve, appendiceal                            orifice, and rectum were photographed. Scope In: 10:15:38 AM Scope Out: 10:28:55 AM Scope Withdrawal Time: 0 hours 9 minutes 5 seconds  Total Procedure Duration: 0 hours 13 minutes 17 seconds  Findings:                 The perianal and digital rectal examinations were                            normal.                           Internal hemorrhoids were found.                           The exam was otherwise without abnormality on                            direct and retroflexion views. Complications:            No immediate complications. Estimated  Blood Loss:     Estimated blood loss: none. Impression:               - Internal hemorrhoids.                           - The examination was otherwise normal on direct                            and retroflexion views.                           - No specimens collected. Recommendation:           - Patient has a contact number available for                            emergencies. The signs and symptoms of potential                            delayed complications were discussed with the                            patient. Return to normal activities tomorrow.                            Written discharge instructions were provided to the                            patient.                           - Resume previous diet.                           - Continue present medications.                           - Repeat colonoscopy in 5 years  likely - will                            review then. 3 mm adenoma 2017 and FHx CRCA                            half-sister but she was 27 [name], MD 09/03/2021 10:32:31 AM This report has been signed electronically.

## 2021-09-03 NOTE — Progress Notes (Signed)
[facility] History and Physical   Primary Care Physician:  Marin Olp, MD   Reason for Procedure:   Hx colon polyps and family hx colon cancer  Plan:    colonoscopy     HPI: Cheyenne Myers is a 64 y.o. female w/ hx 3 mm adenoma 2017, FHx CRCA half sister age 57.   Past Medical History:  Diagnosis Date   Abnormal Pap smear 06/2004   ASCUS, + HPV, CIN II LEEP   Arthritis    Cataract    Cervical radicular pain    managed by [facility]   Epstein Barr infection    Glaucoma    Heart murmur    Hx of adenomatous polyp of colon 02/13/2016   Hyperlipemia    MITRAL VALVE PROLAPSE 08/05/2007   Qualifier: Diagnosis of  By: Tiney Rouge CMA, [place]     URINARY TRACT INFECTION, RECURRENT 12/06/2007   years ago.     Past Surgical History:  Procedure Laterality Date   CARPAL TUNNEL RELEASE Right 08/2011   CERVICAL BIOPSY  W/ LOOP ELECTRODE EXCISION  06/2004   CIN II   COLONOSCOPY     COLPOSCOPY     WISDOM TOOTH EXTRACTION  age 82    Prior to Admission medications   Medication Sig Start Date End Date Taking? Authorizing Provider  Cholecalciferol (VITAMIN D3 PO) Take 5,000 Units by mouth.   Yes [provider]  Glucosamine 750 MG TABS Take 2 tablets by mouth daily.   Yes [provider]  KRILL OIL 1000 MG CAPS Take 2 capsules (2,000 mg total) by mouth 2 (two) times daily. 02/21/11  Yes Ricard Dillon, MD  latanoprost (XALATAN) 0.005 % ophthalmic solution  07/26/18  Yes [provider]  Menaquinone-7 (VITAMIN K2 PO) Take by mouth every other day.    Yes [provider]  Omega-3 1000 MG CAPS Take by mouth.   Yes [provider]  Pregnenolone Micronized (PREGNENOLONE PO) Take by mouth.   Yes [provider]  Probiotic Product (PROBIOTIC DAILY PO) Take by mouth daily.   Yes [provider]  PROGESTERONE MICRONIZED PO Take 100 mg by mouth.   Yes [provider]  Pyridoxine HCl (B-6) 50 MG TABS Take by mouth.    Yes [provider]  Ubiquinol 100 MG CAPS Take by mouth.   Yes [provider]  UNABLE TO FIND Low dose Neltrexone '3mg'$  xl   Yes [provider]  UNABLE TO FIND 500 mg. Curcumin   Yes [provider]  acyclovir (ZOVIRAX) 800 MG tablet TID X 5 DAYS PRN 04/22/18   Marin Olp, MD  NONFORMULARY OR COMPOUNDED ITEM Biest 50/50 2 clocks 1/2 cc day 02/15/21   Megan Salon, MD  valACYclovir (VALTREX) 1000 MG tablet Take 1,000 mg by mouth 2 (two) times daily.    [provider]    Current Outpatient Medications  Medication Sig Dispense Refill   Cholecalciferol (VITAMIN D3 PO) Take 5,000 Units by mouth.     Glucosamine 750 MG TABS Take 2 tablets by mouth daily.     KRILL OIL 1000 MG CAPS Take 2 capsules (2,000 mg total) by mouth 2 (two) times daily.     latanoprost (XALATAN) 0.005 % ophthalmic solution   6   Menaquinone-7 (VITAMIN K2 PO) Take by mouth every other day.      Omega-3 1000 MG CAPS Take by mouth.     Pregnenolone Micronized (PREGNENOLONE PO) Take by mouth.  Probiotic Product (PROBIOTIC DAILY PO) Take by mouth daily.     PROGESTERONE MICRONIZED PO Take 100 mg by mouth.     Pyridoxine HCl (B-6) 50 MG TABS Take by mouth.     Ubiquinol 100 MG CAPS Take by mouth.     UNABLE TO FIND Low dose Neltrexone '3mg'$  xl     UNABLE TO FIND 500 mg. Curcumin     acyclovir (ZOVIRAX) 800 MG tablet TID X 5 DAYS PRN 28 tablet 2   NONFORMULARY OR COMPOUNDED ITEM Biest 50/50 2 clocks 1/2 cc day 1 each 3   valACYclovir (VALTREX) 1000 MG tablet Take 1,000 mg by mouth 2 (two) times daily.     Current Facility-Administered Medications  Medication Dose Route Frequency Provider Last Rate Last Admin   0.9 %  sodium chloride infusion  500 mL Intravenous Once [name], MD        Allergies as of 09/03/2021   (No Known Allergies)    Family History  Problem Relation Age of Onset   Heart disease Mother    Diabetes Mother    Alzheimer's disease Mother     Heart attack Father        age 32 died- but was smoker.    Stroke Sister        tia and pacer   Atrial fibrillation Sister    Dementia Sister        early   Transient ischemic attack Sister    Dementia Sister        severe emotional disturbance   Colon cancer Sister        29   Diabetes Maternal Grandmother    Breast cancer Maternal Grandmother    Diabetes Maternal Grandfather    Colon polyps Neg Hx    Esophageal cancer Neg Hx    Rectal cancer Neg Hx    Stomach cancer Neg Hx     Social History   Socioeconomic History   Marital status: Married    Spouse name: Not on file   Number of children: Not on file   Years of education: Not on file   Highest education level: Not on file  Occupational History   Not on file  Tobacco Use   Smoking status: Never   Smokeless tobacco: Never  Vaping Use   Vaping Use: Never used  Substance and Sexual Activity   Alcohol use: No    Alcohol/week: 0.0 standard drinks   Drug use: No   Sexual activity: Not Currently    Partners: Male    Birth control/protection: Post-menopausal  Other Topics Concern   Not on file  Social History Narrative   Married. 2 children. 2 grandkids 5 and 1.5 in 05/2017.       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  Physical Exam: Vital signs BP 138/86   Pulse 66   Temp 97.8 F (36.6 C) (Oral)   Resp 15  Ht '5\' 1"'$  (1.549 m)   Wt 106 lb (48.1 kg)   LMP 10/17/2009 (Exact Date)   SpO2 100%   BMI 20.03  kg/m   General:   Alert,  Well-developed, well-nourished, pleasant and cooperative in NAD Lungs:  Clear throughout to auscultation.   Heart:  Regular rate and rhythm; no murmurs, clicks, rubs,  or gallops. Abdomen:  Soft, nontender and nondistended. Normal bowel sounds.   Neuro/Psych:  Alert and cooperative. Normal mood and affect. A and O x 3   '@Adarryl Goldammer'$  Simonne Maffucci, MD, [facility] [phone] (pager) 09/03/2021 10:02 AM@

## 2021-09-03 NOTE — Progress Notes (Signed)
VS completed by DT.  Pt's states no medical or surgical changes since previsit or office visit.  

## 2021-09-05 ENCOUNTER — Telehealth: Payer: Self-pay | Admitting: *Deleted

## 2021-09-05 ENCOUNTER — Telehealth: Payer: Self-pay

## 2021-09-05 NOTE — Telephone Encounter (Signed)
  Follow up Call-  Call back number 09/03/2021  Post procedure Call Back phone  # [phone]  Permission to leave phone message Yes  Some recent data might be hidden     Patient questions:  Do you have a fever, pain , or abdominal swelling? No. Pain Score  0 *  Have you tolerated food without any problems? Yes.    Have you been able to return to your normal activities? Yes.    Do you have any questions about your discharge instructions: Diet   No. Medications  No. Follow up visit  No.  Do you have questions or concerns about your Care? No.  Actions: * If pain score is 4 or above: No action needed, pain <4.  Have you developed a fever since your procedure? no  2.   Have you had an respiratory symptoms (SOB or cough) since your procedure? no  3.   Have you tested positive for COVID 19 since your procedure no  4.   Have you had any family members/close contacts diagnosed with the COVID 19 since your procedure?  no   If yes to any of these questions please route to Joylene John, RN and Joella Prince, RN

## 2021-09-05 NOTE — Telephone Encounter (Signed)
NO ANSWER, MESSAGE LEFT FOR PATIENT. 

## 2021-09-06 ENCOUNTER — Ambulatory Visit (INDEPENDENT_AMBULATORY_CARE_PROVIDER_SITE_OTHER): Payer: PPO | Admitting: Family Medicine

## 2021-09-06 ENCOUNTER — Encounter: Payer: Self-pay | Admitting: Family Medicine

## 2021-09-06 ENCOUNTER — Other Ambulatory Visit: Payer: Self-pay

## 2021-09-06 VITALS — BP 130/80 | HR 66 | Temp 97.9°F | Ht 61.0 in | Wt 106.2 lb

## 2021-09-06 DIAGNOSIS — I341 Nonrheumatic mitral (valve) prolapse: Secondary | ICD-10-CM

## 2021-09-06 DIAGNOSIS — I10 Essential (primary) hypertension: Secondary | ICD-10-CM

## 2021-09-06 DIAGNOSIS — E785 Hyperlipidemia, unspecified: Secondary | ICD-10-CM

## 2021-09-06 DIAGNOSIS — E559 Vitamin D deficiency, unspecified: Secondary | ICD-10-CM

## 2021-09-06 NOTE — Patient Instructions (Addendum)
We will call you within two weeks about your referral to Echocardiogram. If you do not hear within 2 weeks, give [place] a call.    Recommended follow up: see you next July!

## 2021-09-11 DIAGNOSIS — H401131 Primary open-angle glaucoma, bilateral, mild stage: Secondary | ICD-10-CM | POA: Diagnosis not present

## 2021-09-11 DIAGNOSIS — H40033 Anatomical narrow angle, bilateral: Secondary | ICD-10-CM | POA: Diagnosis not present

## 2021-09-27 ENCOUNTER — Ambulatory Visit (HOSPITAL_COMMUNITY): Payer: PPO | Attending: Cardiovascular Disease

## 2021-09-27 ENCOUNTER — Other Ambulatory Visit: Payer: Self-pay

## 2021-09-27 DIAGNOSIS — I341 Nonrheumatic mitral (valve) prolapse: Secondary | ICD-10-CM | POA: Diagnosis not present

## 2021-09-27 LAB — ECHOCARDIOGRAM COMPLETE
Area-P 1/2: 3.53 cm2
S' Lateral: 2.4 cm

## 2021-10-07 ENCOUNTER — Other Ambulatory Visit: Payer: Self-pay

## 2021-10-07 ENCOUNTER — Ambulatory Visit
Admission: RE | Admit: 2021-10-07 | Discharge: 2021-10-07 | Disposition: A | Discharge status: Home / Self Care | Payer: PPO | Source: Ambulatory Visit | Attending: Family Medicine | Admitting: Family Medicine

## 2021-10-07 DIAGNOSIS — Z1231 Encounter for screening mammogram for malignant neoplasm of breast: Secondary | ICD-10-CM | POA: Diagnosis not present

## 2021-10-08 DIAGNOSIS — L578 Other skin changes due to chronic exposure to nonionizing radiation: Secondary | ICD-10-CM | POA: Diagnosis not present

## 2021-10-08 DIAGNOSIS — L821 Other seborrheic keratosis: Secondary | ICD-10-CM | POA: Diagnosis not present

## 2021-10-08 DIAGNOSIS — L570 Actinic keratosis: Secondary | ICD-10-CM | POA: Diagnosis not present

## 2021-10-08 DIAGNOSIS — D225 Melanocytic nevi of trunk: Secondary | ICD-10-CM | POA: Diagnosis not present

## 2021-10-08 DIAGNOSIS — L723 Sebaceous cyst: Secondary | ICD-10-CM | POA: Diagnosis not present

## 2021-10-08 DIAGNOSIS — D485 Neoplasm of uncertain behavior of skin: Secondary | ICD-10-CM | POA: Diagnosis not present

## 2021-10-08 DIAGNOSIS — D235 Other benign neoplasm of skin of trunk: Secondary | ICD-10-CM | POA: Diagnosis not present

## 2021-10-08 DIAGNOSIS — Z85828 Personal history of other malignant neoplasm of skin: Secondary | ICD-10-CM | POA: Diagnosis not present

## 2021-10-08 DIAGNOSIS — Z23 Encounter for immunization: Secondary | ICD-10-CM | POA: Diagnosis not present

## 2021-10-11 ENCOUNTER — Other Ambulatory Visit: Payer: Self-pay | Admitting: Family Medicine

## 2021-10-11 DIAGNOSIS — R928 Other abnormal and inconclusive findings on diagnostic imaging of breast: Secondary | ICD-10-CM

## 2021-10-11 DIAGNOSIS — N6310 Unspecified lump in the right breast, unspecified quadrant: Secondary | ICD-10-CM

## 2021-10-14 ENCOUNTER — Ambulatory Visit
Admission: RE | Admit: 2021-10-14 | Discharge: 2021-10-14 | Disposition: A | Discharge status: Home / Self Care | Payer: PPO | Source: Ambulatory Visit | Attending: Family Medicine | Admitting: Family Medicine

## 2021-10-14 ENCOUNTER — Other Ambulatory Visit: Payer: Self-pay

## 2021-10-14 DIAGNOSIS — N6002 Solitary cyst of left breast: Secondary | ICD-10-CM | POA: Diagnosis not present

## 2021-10-14 DIAGNOSIS — R928 Other abnormal and inconclusive findings on diagnostic imaging of breast: Secondary | ICD-10-CM

## 2021-10-14 DIAGNOSIS — R922 Inconclusive mammogram: Secondary | ICD-10-CM | POA: Diagnosis not present

## 2021-11-04 ENCOUNTER — Ambulatory Visit: Payer: BC Managed Care – PPO

## 2021-11-11 ENCOUNTER — Encounter: Payer: Self-pay | Admitting: Family Medicine

## 2021-11-11 MED ORDER — NIRMATRELVIR/RITONAVIR (PAXLOVID)TABLET: 3.0000 | ORAL_TABLET | Freq: Two times a day (BID) | ORAL | 0 refills | Status: AC

## 2021-12-25 DIAGNOSIS — H25043 Posterior subcapsular polar age-related cataract, bilateral: Secondary | ICD-10-CM | POA: Diagnosis not present

## 2021-12-25 DIAGNOSIS — H401131 Primary open-angle glaucoma, bilateral, mild stage: Secondary | ICD-10-CM | POA: Diagnosis not present

## 2021-12-25 DIAGNOSIS — H524 Presbyopia: Secondary | ICD-10-CM | POA: Diagnosis not present

## 2021-12-25 DIAGNOSIS — H2513 Age-related nuclear cataract, bilateral: Secondary | ICD-10-CM | POA: Diagnosis not present

## 2022-01-02 ENCOUNTER — Telehealth: Payer: Self-pay | Admitting: Family Medicine

## 2022-01-02 DIAGNOSIS — E559 Vitamin D deficiency, unspecified: Secondary | ICD-10-CM

## 2022-01-02 DIAGNOSIS — M85852 Other specified disorders of bone density and structure, left thigh: Secondary | ICD-10-CM

## 2022-01-02 NOTE — Telephone Encounter (Signed)
Pt would like to have a Bone Density test done. She stated she discussed it with Dr Yong Channel recently.

## 2022-01-02 NOTE — Telephone Encounter (Signed)
Bone density ordered, ok to schedule.

## 2022-01-06 ENCOUNTER — Other Ambulatory Visit: Payer: Self-pay

## 2022-01-06 ENCOUNTER — Ambulatory Visit (INDEPENDENT_AMBULATORY_CARE_PROVIDER_SITE_OTHER)
Admission: RE | Admit: 2022-01-06 | Discharge: 2022-01-06 | Disposition: A | Discharge status: Home / Self Care | Payer: PPO | Source: Ambulatory Visit | Attending: Family Medicine | Admitting: Family Medicine

## 2022-01-06 DIAGNOSIS — E559 Vitamin D deficiency, unspecified: Secondary | ICD-10-CM | POA: Diagnosis not present

## 2022-01-06 DIAGNOSIS — M85852 Other specified disorders of bone density and structure, left thigh: Secondary | ICD-10-CM

## 2022-01-07 DIAGNOSIS — H25011 Cortical age-related cataract, right eye: Secondary | ICD-10-CM | POA: Diagnosis not present

## 2022-01-07 DIAGNOSIS — H25811 Combined forms of age-related cataract, right eye: Secondary | ICD-10-CM | POA: Diagnosis not present

## 2022-01-07 DIAGNOSIS — H2511 Age-related nuclear cataract, right eye: Secondary | ICD-10-CM | POA: Diagnosis not present

## 2022-01-16 LAB — CBC AND DIFFERENTIAL
HCT: 43 (ref 36–46)
Hemoglobin: 14.7 (ref 12.0–16.0)
Platelets: 250 10*3/uL (ref 150–400)
WBC: 5.6

## 2022-01-16 LAB — LIPID PANEL
Cholesterol: 265 — AB (ref 0–200)
HDL: 86 — AB (ref 35–70)
LDL Cholesterol: 166
Triglycerides: 78 (ref 40–160)

## 2022-01-16 LAB — BASIC METABOLIC PANEL
BUN: 13 (ref 4–21)
CO2: 21 (ref 13–22)
Chloride: 102 (ref 99–108)
Creatinine: 0.8 (ref 0.5–1.1)
Glucose: 95
Potassium: 4.5 mEq/L (ref 3.5–5.1)
Sodium: 143 (ref 137–147)

## 2022-01-16 LAB — HEMOGLOBIN A1C: Hemoglobin A1C: 5.5

## 2022-01-16 LAB — COMPREHENSIVE METABOLIC PANEL
Calcium: 9.6 (ref 8.7–10.7)
eGFR: 83

## 2022-01-16 LAB — TSH: TSH: 2.4 (ref 0.41–5.90)

## 2022-01-16 LAB — CBC: RBC: 4.49 (ref 3.87–5.11)

## 2022-01-21 DIAGNOSIS — H2512 Age-related nuclear cataract, left eye: Secondary | ICD-10-CM | POA: Diagnosis not present

## 2022-01-21 DIAGNOSIS — H25812 Combined forms of age-related cataract, left eye: Secondary | ICD-10-CM | POA: Diagnosis not present

## 2022-01-21 DIAGNOSIS — H25042 Posterior subcapsular polar age-related cataract, left eye: Secondary | ICD-10-CM | POA: Diagnosis not present

## 2022-01-28 ENCOUNTER — Other Ambulatory Visit: Payer: Self-pay

## 2022-01-28 ENCOUNTER — Telehealth: Payer: Self-pay | Admitting: Family Medicine

## 2022-01-28 ENCOUNTER — Encounter: Payer: Self-pay | Admitting: Family Medicine

## 2022-01-28 DIAGNOSIS — E785 Hyperlipidemia, unspecified: Secondary | ICD-10-CM

## 2022-01-28 NOTE — Telephone Encounter (Signed)
Called and spoke with pt and made aware we have not received any records on her. Pt will f/u with [facility] and have them re fax.

## 2022-01-28 NOTE — Telephone Encounter (Signed)
Ok to add these labs in addition to lab requested in [place] message?

## 2022-01-28 NOTE — Telephone Encounter (Signed)
Patient walked in on 01/28/22 - stated she called [facility] and was told we received labs. (We did not) I called [phone] spoke to vanessa, she will speak with the coordinator and we will be receiving labs from January, 2023 and patient will be mailed a copy...    Patients request: Please have Dr Yong Channel add the following lab orders: Folate, B12, B6

## 2022-01-28 NOTE — Telephone Encounter (Signed)
Patient walked in on 01/28/22 - stated she called [facility] and was told we received labs. (We did not) I called [phone] spoke to vanessa, she will speak with the coordinator and we will be receiving labs from January, 2023 and patient will be mailed a copy...      Patients request: Please have Dr Yong Channel add the following lab orders: Folate, B12, B6  Please call patient.

## 2022-01-28 NOTE — Telephone Encounter (Signed)
For orders like this we need a diagnosis-typically a known deficiency.  I do not have a clear reason to order these-suspect we will need to have her sign a waiver as she is willing to pay the full cost-could order under screening for vitamin deficiencies but would be very irregular for insurance to cover this

## 2022-01-28 NOTE — Telephone Encounter (Signed)
Pt states she had some labs done through a clinical study at [facility] and they were supposed to send them to our office. She is checking to see if they were received and if she needs to do a follow up lab here.

## 2022-01-29 NOTE — Telephone Encounter (Signed)
F/u with pt via mychart, the above message a was sent to pt.

## 2022-02-20 ENCOUNTER — Ambulatory Visit (HOSPITAL_BASED_OUTPATIENT_CLINIC_OR_DEPARTMENT_OTHER): Payer: BC Managed Care – PPO | Admitting: Obstetrics & Gynecology

## 2022-02-21 ENCOUNTER — Encounter (HOSPITAL_BASED_OUTPATIENT_CLINIC_OR_DEPARTMENT_OTHER): Payer: Self-pay | Admitting: Obstetrics & Gynecology

## 2022-02-21 ENCOUNTER — Other Ambulatory Visit (HOSPITAL_COMMUNITY)
Admission: RE | Admit: 2022-02-21 | Discharge: 2022-02-21 | Disposition: A | Discharge status: Home / Self Care | Payer: PPO | Source: Ambulatory Visit | Attending: Obstetrics & Gynecology | Admitting: Obstetrics & Gynecology

## 2022-02-21 ENCOUNTER — Ambulatory Visit (INDEPENDENT_AMBULATORY_CARE_PROVIDER_SITE_OTHER): Payer: PPO | Admitting: Obstetrics & Gynecology

## 2022-02-21 ENCOUNTER — Other Ambulatory Visit: Payer: Self-pay

## 2022-02-21 VITALS — BP 132/80 | HR 64 | Ht 60.75 in | Wt 104.8 lb

## 2022-02-21 DIAGNOSIS — Z86010 Personal history of colonic polyps: Secondary | ICD-10-CM

## 2022-02-21 DIAGNOSIS — Z1151 Encounter for screening for human papillomavirus (HPV): Secondary | ICD-10-CM | POA: Insufficient documentation

## 2022-02-21 DIAGNOSIS — Z79890 Hormone replacement therapy (postmenopausal): Secondary | ICD-10-CM

## 2022-02-21 DIAGNOSIS — M85852 Other specified disorders of bone density and structure, left thigh: Secondary | ICD-10-CM

## 2022-02-21 DIAGNOSIS — Z8619 Personal history of other infectious and parasitic diseases: Secondary | ICD-10-CM

## 2022-02-21 DIAGNOSIS — Z800 Family history of malignant neoplasm of digestive organs: Secondary | ICD-10-CM | POA: Diagnosis not present

## 2022-02-21 DIAGNOSIS — Z9889 Other specified postprocedural states: Secondary | ICD-10-CM | POA: Diagnosis not present

## 2022-02-21 DIAGNOSIS — N871 Moderate cervical dysplasia: Secondary | ICD-10-CM | POA: Diagnosis not present

## 2022-02-21 DIAGNOSIS — Z9189 Other specified personal risk factors, not elsewhere classified: Secondary | ICD-10-CM | POA: Diagnosis not present

## 2022-02-21 DIAGNOSIS — Z01419 Encounter for gynecological examination (general) (routine) without abnormal findings: Secondary | ICD-10-CM | POA: Insufficient documentation

## 2022-02-21 NOTE — Progress Notes (Signed)
66 y.o. G28P2002 Married White or Caucasian female here for medicare breast and pelvic exam in high risk patient.  H/O +HPV.  On HRT with integrative provider.  No recent vaginal bleeding.  LMP was many years ago.  H/o abnormal pap smear.  Does need this done today.  Guidelines reviewed. ? ?Patient's last menstrual period was 10/17/2009 (exact date).          ?Sexually active: No.  ?The current method of family planning is post menopausal status.    ?Smoker:  no ? ?Health Maintenance: ?Pap:  01/19/2019 Negative ?History of abnormal Pap:  yes, h/o LEEP 2005 ?MMG:  10/07/2021 follow up  ?Colonoscopy:  09/03/2021, follow up 5 years ?BMD:   01/06/2022 Osteopenia ?Screening Labs: 06/2021 with Dr. Yong Channel ? ? reports that she has never smoked. She has never used smokeless tobacco. She reports that she does not drink alcohol and does not use drugs. ? ?Past Medical History:  ?Diagnosis Date  ? Abnormal Pap smear 06/2004  ? ASCUS, + HPV, CIN II LEEP  ? Arthritis   ? Cataract   ? Cervical radicular pain   ? managed by [facility]  ? [place] Patient infection   ? Glaucoma   ? Heart murmur   ? Hx of adenomatous polyp of colon 02/13/2016  ? Hyperlipemia   ? MITRAL VALVE PROLAPSE 08/05/2007  ? Qualifier: Diagnosis of  By: Tiney Rouge CMA, Ellison Hughs    ? ? ?Past Surgical History:  ?Procedure Laterality Date  ? CARPAL TUNNEL RELEASE Right 08/2011  ? CERVICAL BIOPSY  W/ LOOP ELECTRODE EXCISION  06/2004  ? CIN II  ? COLONOSCOPY    ? COLPOSCOPY    ? WISDOM TOOTH EXTRACTION  age 29  ? ? ?Current Outpatient Medications  ?Medication Sig Dispense Refill  ? Cholecalciferol (VITAMIN D3 PO) Take 5,000 Units by mouth.    ? Glucosamine 750 MG TABS Take 2 tablets by mouth daily.    ? KRILL OIL 1000 MG CAPS Take 2 capsules (2,000 mg total) by mouth 2 (two) times daily.    ? latanoprost (XALATAN) 0.005 % ophthalmic solution   6  ? Menaquinone-7 (VITAMIN K2 PO) Take by mouth every other day.     ? NONFORMULARY OR COMPOUNDED ITEM Biest 50/50 2 clocks 1/2 cc day 1 each 3   ? Omega-3 1000 MG CAPS Take by mouth.    ? Pregnenolone Micronized (PREGNENOLONE PO) Take by mouth.    ? Probiotic Product (PROBIOTIC DAILY PO) Take by mouth daily.    ? PROGESTERONE MICRONIZED PO Take 100 mg by mouth.    ? Pyridoxine HCl (B-6) 50 MG TABS Take by mouth.    ? Ubiquinol 100 MG CAPS Take by mouth.    ? UNABLE TO FIND Low dose Neltrexone 3mg  xl    ? UNABLE TO FIND 500 mg. Curcumin    ? valACYclovir (VALTREX) 1000 MG tablet Take 1,000 mg by mouth 2 (two) times daily.    ? ?No current facility-administered medications for this visit.  ? ? ?Family History  ?Problem Relation Age of Onset  ? Heart disease Mother   ? Diabetes Mother   ? Alzheimer's disease Mother   ? Heart attack Father   ?     age 84 died- but was smoker.   ? Stroke Sister   ?     tia and pacer  ? Atrial fibrillation Sister   ? Dementia Sister   ?     early  ? Transient ischemic attack  Sister   ? Dementia Sister   ?     severe emotional disturbance  ? Colon cancer Sister   ?     75  ? Diabetes Maternal Grandmother   ? Breast cancer Maternal Grandmother   ? Diabetes Maternal Grandfather   ? Colon polyps Neg Hx   ? Esophageal cancer Neg Hx   ? Rectal cancer Neg Hx   ? Stomach cancer Neg Hx   ? ? ?Review of Systems  ?All other systems reviewed and are negative. ? ?Exam:   ?BP 132/80 (BP Location: Right Arm, Patient Position: Sitting, Cuff Size: Normal)   Pulse 64   Ht 5' 0.75" (1.543 m)   Wt 104 lb 12.8 oz (47.5 kg)   LMP 10/17/2009 (Exact Date)   BMI 19.97 kg/m?   Height: 5' 0.75" (154.3 cm) ? ?General appearance: alert, cooperative and appears stated age ?Head: Normocephalic, without obvious abnormality, atraumatic ?Neck: no adenopathy, supple, symmetrical, trachea midline and thyroid normal to inspection and palpation ?Lungs: clear to auscultation bilaterally ?Breasts: normal appearance, no masses or tenderness ?Heart: regular rate and rhythm ?Abdomen: soft, non-tender; bowel sounds normal; no masses,  no organomegaly ?Extremities:  extremities normal, atraumatic, no cyanosis or edema ?Skin: Skin color, texture, turgor normal. No rashes or lesions ?Lymph nodes: Cervical, supraclavicular, and axillary nodes normal. ?No abnormal inguinal nodes palpated ?Neurologic: Grossly normal ? ? ?Pelvic: External genitalia:  no lesions ?             Urethra:  normal appearing urethra with no masses, tenderness or lesions ?             Bartholins and Skenes: normal    ?             Vagina: normal appearing vagina with normal color and no discharge, no lesions ?             Cervix: no lesions ?             Pap taken: Yes.   ?Bimanual Exam:  Uterus:  normal size, contour, position, consistency, mobility, non-tender ?             Adnexa: no mass, fullness, tenderness ?              Rectovaginal: Confirms ?              Anus:  normal sphincter tone, no lesions ? ?Chaperone, Octaviano Batty, CMA, was present for exam. ? ?Assessment/Plan: ?1. GYN exam for high-risk Medicare patient ?- pap and HR HPV obtained today ?- MMG 09/2021 with normal follow up ?- colonoscopy 08/2021, follow up 5 years ?- BMD 12/2021, will repeat 2-3 years ?- labs with Dr. Yong Channel ?- vaccines reviewed/updated ? ?2. Dysplasia of cervix, high grade CIN 2 ?- Cytology - PAP( Curran) ?- PR OBTAINING SCREEN PAP SMEAR ? ?3. H/O cold sores ?- does not need Valtrex rx ? ?4. History of loop electrical excision procedure (LEEP) ? ?5. Hx of adenomatous polyp of colon and family history of colon cancer ?- having colonoscopy every 5 years at this point ? ?7. Postmenopausal HRT (hormone replacement therapy) ?- on HRT with integrative provider ? ?8. Osteopenia of left femoral neck ? ?

## 2022-02-26 LAB — CYTOLOGY - PAP
Comment: NEGATIVE
Diagnosis: NEGATIVE
High risk HPV: NEGATIVE

## 2022-07-03 DIAGNOSIS — E785 Hyperlipidemia, unspecified: Secondary | ICD-10-CM | POA: Diagnosis not present

## 2022-07-03 DIAGNOSIS — Z131 Encounter for screening for diabetes mellitus: Secondary | ICD-10-CM | POA: Diagnosis not present

## 2022-07-03 DIAGNOSIS — Z139 Encounter for screening, unspecified: Secondary | ICD-10-CM | POA: Diagnosis not present

## 2022-07-03 DIAGNOSIS — M85852 Other specified disorders of bone density and structure, left thigh: Secondary | ICD-10-CM | POA: Diagnosis not present

## 2022-07-03 DIAGNOSIS — Z86010 Personal history of colonic polyps: Secondary | ICD-10-CM | POA: Diagnosis not present

## 2022-07-03 DIAGNOSIS — M4802 Spinal stenosis, cervical region: Secondary | ICD-10-CM | POA: Diagnosis not present

## 2022-07-18 ENCOUNTER — Other Ambulatory Visit (INDEPENDENT_AMBULATORY_CARE_PROVIDER_SITE_OTHER): Payer: PPO

## 2022-07-18 ENCOUNTER — Encounter: Payer: Self-pay | Admitting: Family Medicine

## 2022-07-18 ENCOUNTER — Ambulatory Visit (INDEPENDENT_AMBULATORY_CARE_PROVIDER_SITE_OTHER): Payer: PPO | Admitting: Family Medicine

## 2022-07-18 VITALS — BP 132/80 | HR 71 | Temp 97.7°F | Ht 60.0 in | Wt 109.6 lb

## 2022-07-18 DIAGNOSIS — M85852 Other specified disorders of bone density and structure, left thigh: Secondary | ICD-10-CM | POA: Diagnosis not present

## 2022-07-18 DIAGNOSIS — E559 Vitamin D deficiency, unspecified: Secondary | ICD-10-CM | POA: Diagnosis not present

## 2022-07-18 DIAGNOSIS — E785 Hyperlipidemia, unspecified: Secondary | ICD-10-CM | POA: Diagnosis not present

## 2022-07-18 DIAGNOSIS — Z0000 Encounter for general adult medical examination without abnormal findings: Secondary | ICD-10-CM | POA: Diagnosis not present

## 2022-07-18 DIAGNOSIS — R739 Hyperglycemia, unspecified: Secondary | ICD-10-CM

## 2022-07-18 LAB — CBC WITH DIFFERENTIAL/PLATELET
Basophils Absolute: 0.1 10*3/uL (ref 0.0–0.1)
Basophils Relative: 1.1 % (ref 0.0–3.0)
Eosinophils Absolute: 0 10*3/uL (ref 0.0–0.7)
Eosinophils Relative: 0.9 % (ref 0.0–5.0)
HCT: 42.6 % (ref 36.0–46.0)
Hemoglobin: 14.5 g/dL (ref 12.0–15.0)
Lymphocytes Relative: 23.4 % (ref 12.0–46.0)
Lymphs Abs: 1.1 10*3/uL (ref 0.7–4.0)
MCHC: 34 g/dL (ref 30.0–36.0)
MCV: 93.2 fl (ref 78.0–100.0)
Monocytes Absolute: 0.3 10*3/uL (ref 0.1–1.0)
Monocytes Relative: 6 % (ref 3.0–12.0)
Neutro Abs: 3.2 10*3/uL (ref 1.4–7.7)
Neutrophils Relative %: 68.6 % (ref 43.0–77.0)
Platelets: 215 10*3/uL (ref 150.0–400.0)
RBC: 4.57 Mil/uL (ref 3.87–5.11)
RDW: 13.1 % (ref 11.5–15.5)
WBC: 4.6 10*3/uL (ref 4.0–10.5)

## 2022-07-18 LAB — COMPREHENSIVE METABOLIC PANEL
ALT: 16 U/L (ref 0–35)
AST: 20 U/L (ref 0–37)
Albumin: 4.6 g/dL (ref 3.5–5.2)
Alkaline Phosphatase: 22 U/L — ABNORMAL LOW (ref 39–117)
BUN: 13 mg/dL (ref 6–23)
CO2: 28 mEq/L (ref 19–32)
Calcium: 9.2 mg/dL (ref 8.4–10.5)
Chloride: 102 mEq/L (ref 96–112)
Creatinine, Ser: 0.75 mg/dL (ref 0.40–1.20)
GFR: 83.29 mL/min (ref >60.00)
Glucose, Bld: 89 mg/dL (ref 70–99)
Potassium: 4.1 mEq/L (ref 3.5–5.1)
Sodium: 140 mEq/L (ref 135–145)
Total Bilirubin: 1.2 mg/dL (ref 0.2–1.2)
Total Protein: 7.3 g/dL (ref 6.0–8.3)

## 2022-07-18 LAB — VITAMIN D 25 HYDROXY (VIT D DEFICIENCY, FRACTURES): VITD: 68.63 ng/mL (ref 30.00–100.00)

## 2022-07-18 LAB — HEMOGLOBIN A1C: Hgb A1c MFr Bld: 5.5 % (ref 4.6–6.5)

## 2022-07-18 NOTE — Progress Notes (Signed)
Phone [phone]   Subjective:  Patient presents today for their annual physical. Chief complaint-noted.   See problem oriented charting- ROS- full  review of systems was completed and negative Per full ROS sheet completed by patient  The following were reviewed and entered/updated in epic: Past Medical History:  Diagnosis Date   Abnormal Pap smear 06/2004   ASCUS, + HPV, CIN II LEEP   Arthritis    Cataract    Cervical radicular pain    managed by [facility]   Epstein Barr infection    Glaucoma    Heart murmur    Hx of adenomatous polyp of colon 02/13/2016   Hyperlipemia    MITRAL VALVE PROLAPSE 08/05/2007   Qualifier: Diagnosis of  By: Tiney Rouge CMA, Brent.[place]     Patient Active Problem List   Diagnosis Date Noted   Osteopenia of left femoral neck 10/29/2019    Priority: Medium    Glaucoma 07/28/2018    Priority: Medium    Stenosis of cervical spine 07/18/2014    Priority: Medium    Hyperlipidemia 08/05/2007    Priority: Medium    Vitamin D deficiency 07/18/2022    Priority: Low   Hx of adenomatous polyp of colon 02/13/2016    Priority: Low   H/O cold sores 07/18/2014    Priority: Low   Cervical spondylosis without myelopathy 01/25/2014    Priority: Low   Past Surgical History:  Procedure Laterality Date   CARPAL TUNNEL RELEASE Right 08/2011   CATARACT EXTRACTION Bilateral    CERVICAL BIOPSY  W/ LOOP ELECTRODE EXCISION  06/2004   CIN II   COLONOSCOPY     COLPOSCOPY     WISDOM TOOTH EXTRACTION  age 71    Family History  Problem Relation Age of Onset   Heart disease Mother        age 30   Diabetes Mother    Alzheimer's disease Mother    Heart attack Father        age 34 died- but was smoker.    Stroke Sister        tia and pacer   Atrial fibrillation Sister    Dementia Sister        early   Transient ischemic attack Sister    Dementia Sister        severe emotional disturbance   Colon cancer Sister        66   Diabetes Maternal Grandmother    Breast  cancer Maternal Grandmother    Diabetes Maternal Grandfather    Colon polyps Neg Hx    Esophageal cancer Neg Hx    Rectal cancer Neg Hx    Stomach cancer Neg Hx     Medications- reviewed and updated Current Outpatient Medications  Medication Sig Dispense Refill   Cholecalciferol (VITAMIN D3 PO) Take 5,000 Units by mouth.     Glucosamine 750 MG TABS Take 2 tablets by mouth daily.     KRILL OIL 1000 MG CAPS Take 2 capsules (2,000 mg total) by mouth 2 (two) times daily.     latanoprost (XALATAN) 0.005 % ophthalmic solution   6   Menaquinone-7 (VITAMIN K2 PO) Take by mouth every other day.      NALTREXONE HCL PO Take 3 mg by mouth. For immune support- through integrative health     NONFORMULARY OR COMPOUNDED ITEM Biest 50/50 2 clocks 1/2 cc day 1 each 3   Omega-3 1000 MG CAPS Take by mouth.     Pregnenolone Micronized (  PREGNENOLONE PO) Take by mouth.     Probiotic Product (PROBIOTIC DAILY PO) Take by mouth daily.     PROGESTERONE MICRONIZED PO Take 100 mg by mouth.     Pyridoxine HCl (B-6) 50 MG TABS Take by mouth.     Ubiquinol 100 MG CAPS Take by mouth.     UNABLE TO FIND 500 mg. Curcumin     valACYclovir (VALTREX) 1000 MG tablet Take 1,000 mg by mouth 2 (two) times daily.     No current facility-administered medications for this visit.    Allergies-reviewed and updated No Known Allergies  Social History   Social History Narrative   Married. 2 children. 2 grandkids 5 and 1.5 in 05/2017.       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 Oropharynx normal Neck: no thyromegaly CV: RRR no  murmurs rubs or gallops Lungs: CTAB no crackles, wheeze, rhonchi Abdomen: soft/nontender/nondistended/normal bowel sounds. No rebound or guarding.  Ext: no edema Skin: warm, dry Neuro: grossly normal, moves all extremities, PERRLA   Assessment and Plan   66 y.o. female presenting for annual physical.  Health Maintenance counseling: 1. Anticipatory guidance: Patient counseled regarding regular dental exams -q6 months, eye exams-sees regularly due to glaucoma,  avoiding smoking and second hand smoke , limiting alcohol to 1 beverage per day-does not drink , no illicit drugs .   2. Risk factor reduction:  Advised patient of need for regular exercise and diet rich and fruits and vegetables to reduce risk of heart attack and stroke.  Exercise- continues to walk regularly depends on weather but 30 mins to an hour  Diet/weight management-thankful for mild weight gain over the last year-continues to eat healthy.  Wt Readings from Last 3 Encounters:  07/18/22 109 lb 9.6 oz (49.7 kg)  02/21/22 104 lb 12.8 oz (47.5 kg)  09/06/21 106 lb 3.2 oz (48.2 kg)  3. Immunizations/screenings/ancillary studies-patient opts out of COVID-19 vaccination, Prevnar 20 recommended - wants to hold off for now Immunization History  Administered Date(s) Administered   Influenza Split 10/14/2012   Influenza Whole 10/26/2009   Influenza,inj,Quad PF,6+ Mos 08/29/2013, 09/29/2017, 09/28/2018   Influenza-Unspecified 09/21/2014, 09/05/2015, 09/02/2016   Td 11/21/2006   Tdap 10/23/2015   Zoster Recombinat (Shingrix) 04/21/2018, 06/30/2018   Zoster, Live 09/22/2016  4. Cervical cancer screening- followed by Dr. Sabra Heck.  Just had a Pap in March-low risk result and will continue to follow Dr. Sabra Heck 5. Breast cancer screening-  breast exam with GYN and mammogram 10/07/2021-did require follow-up diagnostic mammogram and ultrasound for benign left breast cysts-annual follow-up 6. Colon cancer screening - 09/03/2021 with 5-year  repeat 7. Skin cancer screening-follows with Dr. Delman Cheadle and will see in novemberadvised regular sunscreen use. Denies worrisome, changing, or new skin lesions- one are on her left shoulder- looks like a cyst but will have dermatology take a look   8. Birth control/STD check- postmenopausal/monogamous 9. Osteoporosis screening at 58- October 24, 2019 with osteopenia-great job with weightbearing exercise and takes vitamin  D-ohad repeat in 01/06/22 essentially stable  10. Smoking associated screening - Never smoker  Status of chronic or acute concerns   #social update- has paxlovid from last year- expiration in September- ok to use if develops covid  #Murmur-very mild mitral valve regurgitation and mitral valve prolapse on echo 2022-not clinically significant-I do not always hear a murmur but has been heard by Dr. Arnoldo Morale and her integrative physician. No chest pain shortness of breath. No palpitations   #[place] elevated blood pressure without diagnosis of hypertension-blood pressure looks good today thankfully   #hyperlipidemia S: Medication:Krill oil and DHA The 10-year ASCVD risk score (Arnett DK, et al., 2019) is: 5.7%* (Cholesterol units were assumed)  -but dad died of heart attack at 56- was a smoker Lab Results  Component Value Date   CHOL 265 (A) 01/16/2022   HDL 86 (A) 01/16/2022   LDLCALC 166 01/16/2022   LDLDIRECT 134.3 03/01/2012   TRIG 78 01/16/2022   CHOLHDL 3 07/16/2021   A/P: Mild elevations in lipids-would not start statin or evaluate further with CT cardiac scoring unless risk above 7.5% regularly- would want to maximize diet before starting statin   #Vitamin D deficiency S: Medication: Takes K2 as well as vitamin D 5000 every other day- daily in winter  Last vitamin D Lab Results  Component Value Date   VD25OH 93.92 07/16/2021  A/P: want to make sure stays under 100- update vitamin D   #Hormone replacement-through integrative health- but no recnet testing    Recommended follow up: Return in about 1 year (around 07/19/2023) for physical or sooner if needed.Schedule b4 you leave. Future Appointments  Date Time Provider [place]  07/21/2022 10:00 AM [facility]   Lab/Order associations: Will return for fasting labs on Monday   ICD-10-CM   1. Preventative health care  Z00.00     2. Osteopenia of left femoral neck  M85.852     3. Hyperlipidemia, unspecified hyperlipidemia type  E78.5 Comprehensive metabolic panel    CBC with Differential/Platelet    CT CARDIAC SCORING (SELF PAY ONLY)    Apolipoprotein B    NMR, lipoprofile    Homocysteine    4. Vitamin D deficiency  E55.9 VITAMIN D 25 Hydroxy (Vit-D Deficiency, Fractures)    5. Hyperglycemia  R73.9 HgB A1c    Insulin, random     No orders of the defined types were placed in this encounter.  Return precautions advised.  Garret Reddish, MD

## 2022-07-18 NOTE — Patient Instructions (Addendum)
Wants to hold on prevnar 20 for now- can reach out to use if you change your mind on this  Go straight to [facility] and can get bloodwork  We will call you within two weeks about your referral to ct cardiac scoring through [place].  Their phone number is [phone].  Please call them if you have not heard in 1-2 weeks  Recommended follow up: Return in about 1 year (around 07/19/2023) for physical or sooner if needed.Schedule b4 you leave.

## 2022-07-21 ENCOUNTER — Other Ambulatory Visit: Payer: PPO

## 2022-07-21 LAB — NMR, LIPOPROFILE
Cholesterol, Total: 255 mg/dL — ABNORMAL HIGH (ref 100–199)
HDL Particle Number: 38.6 umol/L (ref >=30.5)
HDL-C: 93 mg/dL (ref >39)
LDL Particle Number: 1385 nmol/L — ABNORMAL HIGH (ref <1000)
LDL Size: 21.5 nm (ref >20.5)
LDL-C (NIH Calc): 149 mg/dL — ABNORMAL HIGH (ref 0–99)
LP-IR Score: <25 (ref <=45)
Small LDL Particle Number: 229 nmol/L (ref <=527)
Triglycerides: 79 mg/dL (ref 0–149)

## 2022-07-21 LAB — INSULIN, RANDOM: INSULIN: 6.2 u[IU]/mL (ref 2.6–24.9)

## 2022-07-21 LAB — HOMOCYSTEINE: Homocysteine: 8.7 umol/L (ref 0.0–17.2)

## 2022-07-21 LAB — APOLIPOPROTEIN B: Apolipoprotein B: 113 mg/dL — ABNORMAL HIGH (ref <90)

## 2022-07-23 ENCOUNTER — Other Ambulatory Visit: Payer: Self-pay | Admitting: Physician Assistant

## 2022-07-23 DIAGNOSIS — E785 Hyperlipidemia, unspecified: Secondary | ICD-10-CM

## 2022-07-25 ENCOUNTER — Encounter (HOSPITAL_BASED_OUTPATIENT_CLINIC_OR_DEPARTMENT_OTHER): Payer: Self-pay

## 2022-07-25 ENCOUNTER — Ambulatory Visit (HOSPITAL_BASED_OUTPATIENT_CLINIC_OR_DEPARTMENT_OTHER)
Admission: RE | Admit: 2022-07-25 | Discharge: 2022-07-25 | Disposition: A | Discharge status: Home / Self Care | Payer: PPO | Source: Ambulatory Visit | Attending: Physician Assistant | Admitting: Physician Assistant

## 2022-07-25 DIAGNOSIS — E785 Hyperlipidemia, unspecified: Secondary | ICD-10-CM | POA: Insufficient documentation

## 2022-07-28 ENCOUNTER — Encounter: Payer: Self-pay | Admitting: Family Medicine

## 2022-08-02 IMAGING — MG DIGITAL SCREENING BILAT W/ TOMO W/ CAD
8 series · 9 of 24 positions shown · non-contrast
Comparison: Previous exam(s).

CLINICAL DATA: Screening.

EXAM:
DIGITAL SCREENING BILATERAL MAMMOGRAM WITH TOMO AND CAD

[R MLO synth-2D]
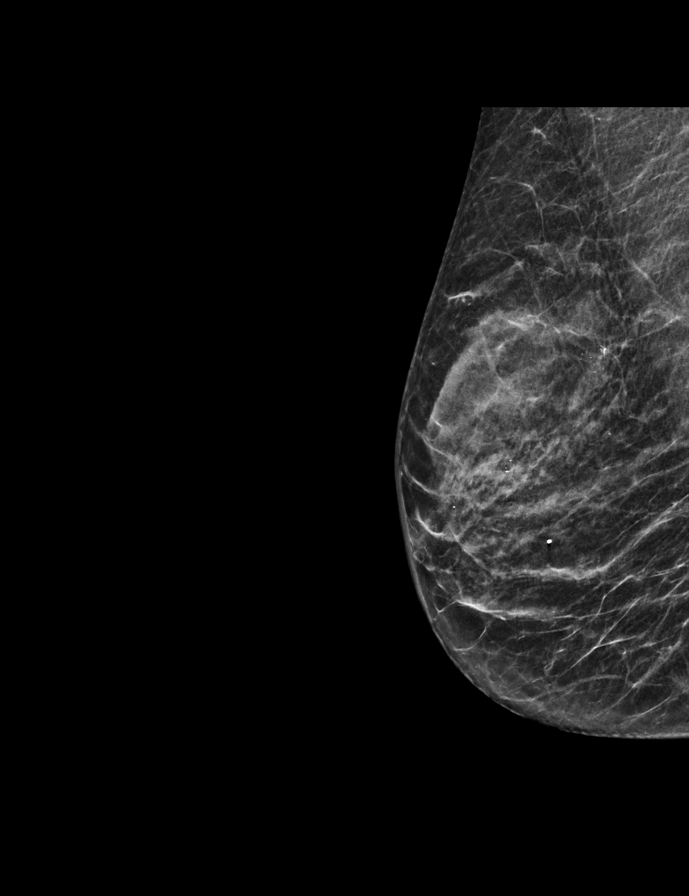

[R CC synth-2D]
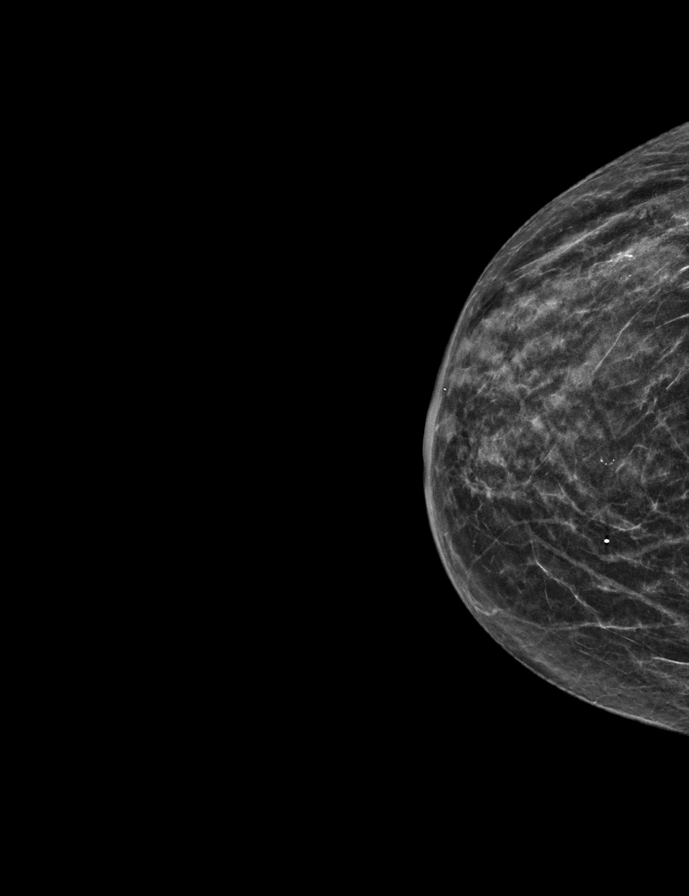

[L MLO synth-2D]
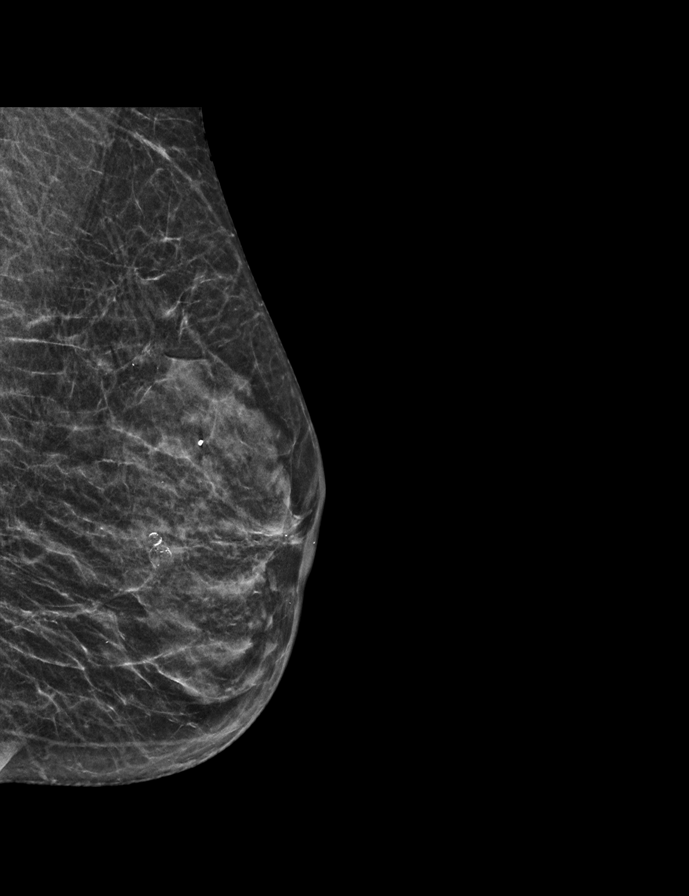

[L CC synth-2D]
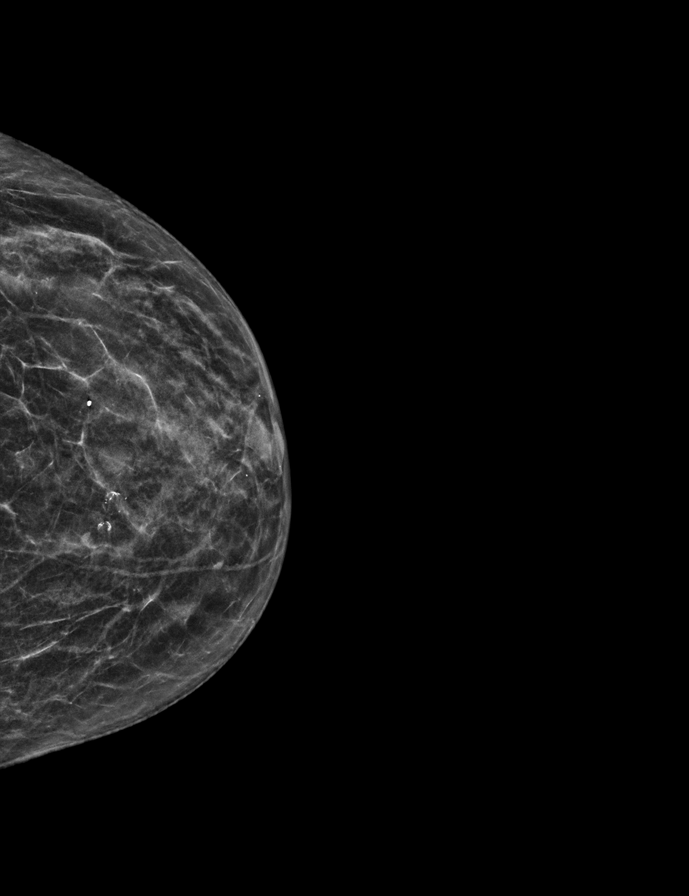

[L MLO tomo · 2 of 42 frames shown]
[frame 14/42]
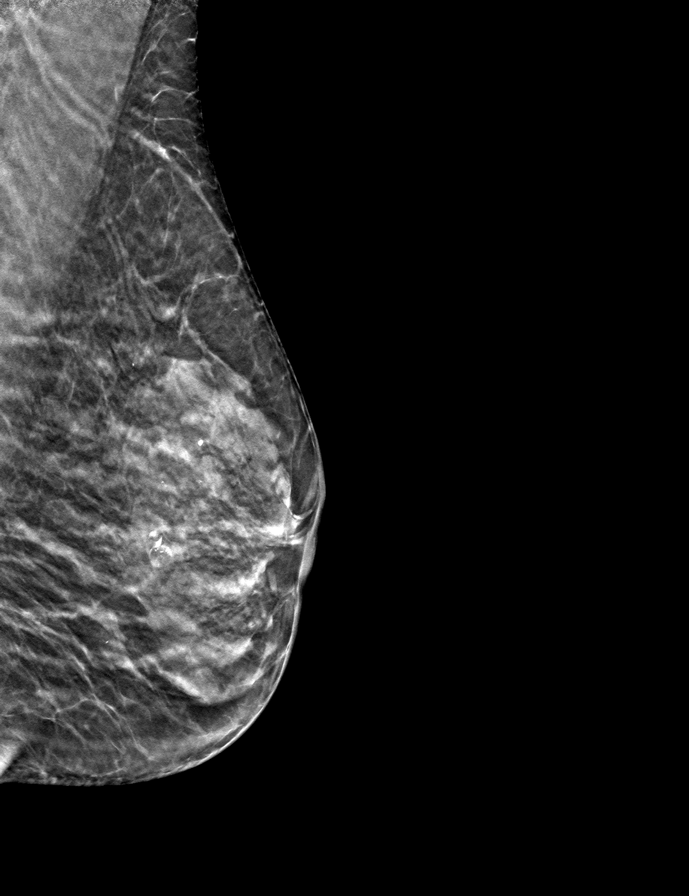
[frame 21/42]
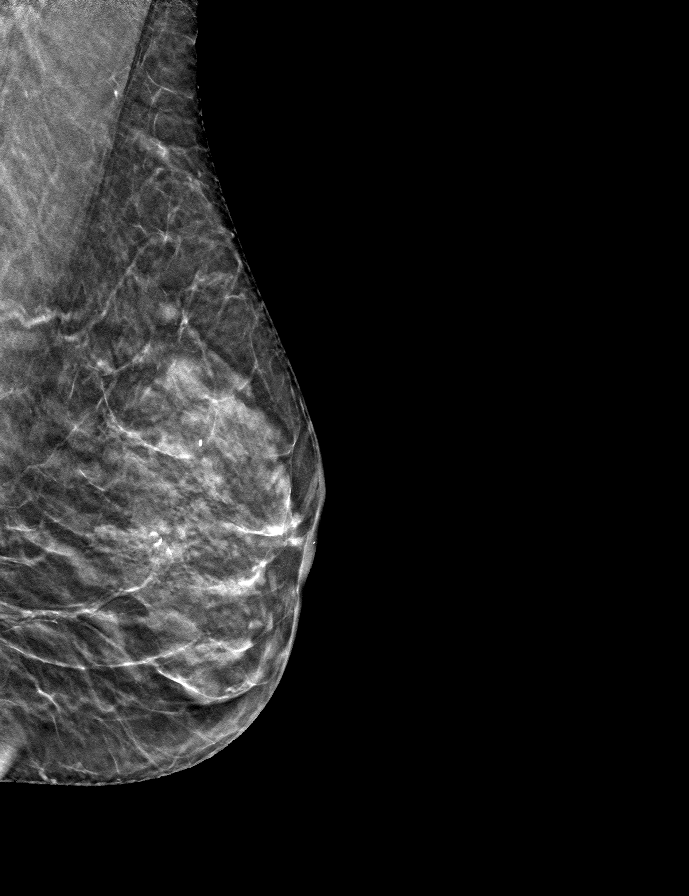

[L CC tomo · tomo slice 21/41.0]
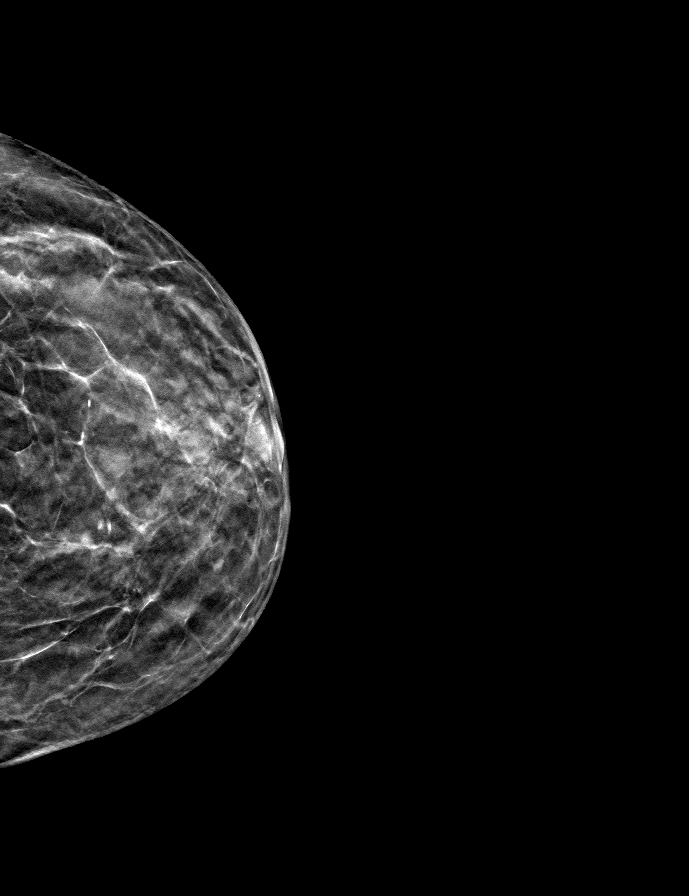

[R CC tomo · tomo slice 21/40.0]
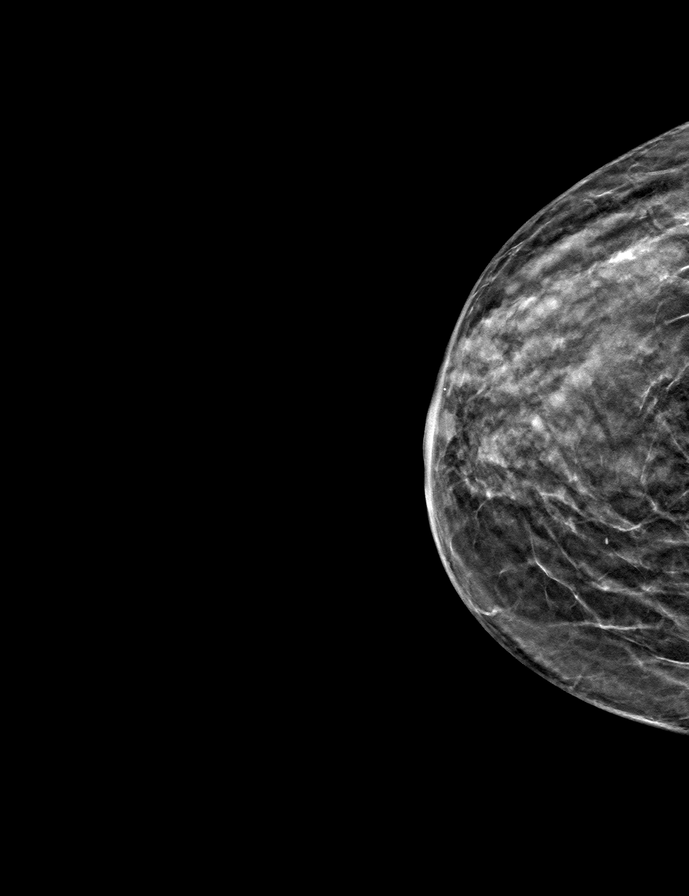

[R MLO tomo · tomo slice 21/40.0]
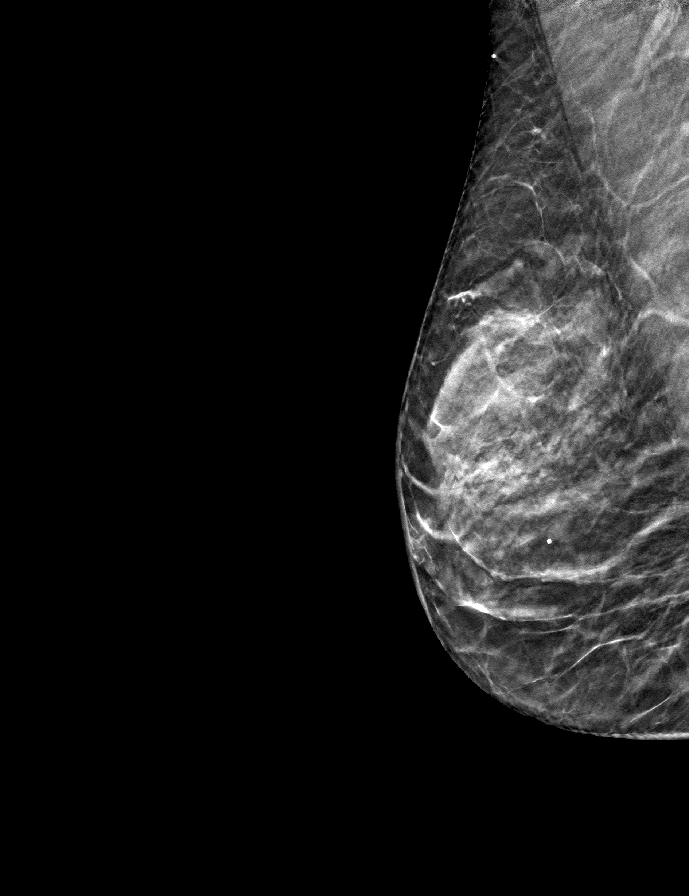

[9 of 24 positions shown; findings below may reference images not displayed]

ACR Breast Density Category c: The breast tissue is heterogeneously
dense, which may obscure small masses.
FINDINGS: There are no findings suspicious for malignancy. Images were
processed with CAD.
IMPRESSION: No mammographic evidence of malignancy. A result letter of this
screening mammogram will be mailed directly to the patient.

RECOMMENDATION:
Screening mammogram in one year. (Code:[id number])

BI-RADS CATEGORY  1: Negative.

## 2022-09-02 ENCOUNTER — Other Ambulatory Visit: Payer: Self-pay | Admitting: Family Medicine

## 2022-09-02 ENCOUNTER — Encounter: Payer: Self-pay | Admitting: Family Medicine

## 2022-09-02 DIAGNOSIS — Z1231 Encounter for screening mammogram for malignant neoplasm of breast: Secondary | ICD-10-CM

## 2022-09-02 NOTE — Telephone Encounter (Signed)
Called to schedule VV per instructions from keba below.   Patient states: -She isn't sure why a virtual visit is needed since she has the paxlovid but wants to know if she could complete the entire dose.  - She would like to see only Dr. Yong Channel virtually if she really has to have a virtual visit.    PCP doesn't have any availabilities this week. Would PCP be willing to work in patient? Please advise.

## 2022-09-05 ENCOUNTER — Ambulatory Visit (INDEPENDENT_AMBULATORY_CARE_PROVIDER_SITE_OTHER): Payer: PPO

## 2022-09-05 DIAGNOSIS — Z0000 Encounter for general adult medical examination without abnormal findings: Secondary | ICD-10-CM | POA: Diagnosis not present

## 2022-09-05 NOTE — Progress Notes (Addendum)
Virtual Visit via Telephone Note  I connected with  Cheyenne Myers on 09/05/22 at  8:00 AM EDT by telephone and verified that I am speaking with the correct person using two identifiers.  Medicare Annual Wellness visit completed telephonically due to Covid-19 pandemic.   Persons participating in this call: This Health Coach and this patient.   Location: Patient: home Provider: office   I discussed the limitations, risks, security and privacy concerns of performing an evaluation and management service by telephone and the availability of in person appointments. The patient expressed understanding and agreed to proceed.  Unable to perform video visit due to video visit attempted and failed and/or patient does not have video capability.   Some vital signs may be absent or patient reported.   Willette Brace, LPN  Subjective:   Cheyenne Myers is a 66 y.o. female who presents for an Initial Medicare Annual Wellness Visit.  Review of Systems     Cardiac Risk Factors include: advanced age (>2mn, >>36women);dyslipidemia     Objective:    There were no vitals filed for this visit. There is no height or weight on file to calculate BMI.     09/05/2022    8:05 AM 01/24/2016    3:57 PM  Advanced Directives  Does Patient Have a Medical Advance Directive? Yes Yes  Type of AParamedicof [place]Living will HCedar Glen LakesLiving will  Does patient want to make changes to medical advance directive? No - Patient declined No - Patient declined  Copy of [place] Chart? Yes - validated most recent copy scanned in chart (See row information) No - copy requested    Current Medications (verified) Outpatient Encounter Medications as of 09/05/2022  Medication Sig   Cholecalciferol (VITAMIN D3 PO) Take 5,000 Units by mouth.   Cyanocobalamin (VITAMIN B 12 PO) Take by mouth.   Glucosamine 750 MG TABS Take 2 tablets by mouth daily.   KRILL OIL  1000 MG CAPS Take 2 capsules (2,000 mg total) by mouth 2 (two) times daily.   latanoprost (XALATAN) 0.005 % ophthalmic solution    Menaquinone-7 (VITAMIN K2 PO) Take by mouth every other day.    NALTREXONE HCL PO Take 3 mg by mouth. For immune support- through integrative health   NONFORMULARY OR COMPOUNDED ITEM Biest 50/50 2 clocks 1/2 cc day   Omega-3 1000 MG CAPS Take by mouth.   Pregnenolone Micronized (PREGNENOLONE PO) Take by mouth.   Probiotic Product (PROBIOTIC DAILY PO) Take by mouth daily.   PROGESTERONE MICRONIZED PO Take 100 mg by mouth.   Pyridoxine HCl (B-6) 50 MG TABS Take by mouth.   Ubiquinol 100 MG CAPS Take by mouth.   UNABLE TO FIND 500 mg. Curcumin   valACYclovir (VALTREX) 1000 MG tablet Take 1,000 mg by mouth 2 (two) times daily.   No facility-administered encounter medications on file as of 09/05/2022.    Allergies (verified) Patient has no known allergies.   History: Past Medical History:  Diagnosis Date   Abnormal Pap smear 06/2004   ASCUS, + HPV, CIN II LEEP   Arthritis    Cataract    Cervical radicular pain    managed by [facility]   Epstein Barr infection    Glaucoma    Heart murmur    Hx of adenomatous polyp of colon 02/13/2016   Hyperlipemia    MITRAL VALVE PROLAPSE 08/05/2007   Qualifier: Diagnosis of  By: ATiney RougeCMA, LEllison Hughs  Past Surgical History:  Procedure Laterality Date   CARPAL TUNNEL RELEASE Right 08/2011   CATARACT EXTRACTION Bilateral    CERVICAL BIOPSY  W/ LOOP ELECTRODE EXCISION  06/2004   CIN II   COLONOSCOPY     COLPOSCOPY     WISDOM TOOTH EXTRACTION  age 50   Family History  Problem Relation Age of Onset   Heart disease Mother        age 106   Diabetes Mother    Alzheimer's disease Mother    Heart attack Father        age 42 died- but was smoker.    Stroke Sister        tia and pacer   Atrial fibrillation Sister    Dementia Sister        early   Transient ischemic attack Sister    Dementia Sister        severe  emotional disturbance   Colon cancer Sister        52   Diabetes Maternal Grandmother    Breast cancer Maternal Grandmother    Diabetes Maternal Grandfather    Colon polyps Neg Hx    Esophageal cancer Neg Hx    Rectal cancer Neg Hx    Stomach cancer Neg Hx    Social History   Socioeconomic History   Marital status: Married    Spouse name: Not on file   Number of children: Not on file   Years of education: Not on file   Highest education level: Not on file  Occupational History   Not on file  Tobacco Use   Smoking status: Never   Smokeless tobacco: Never  Vaping Use   Vaping Use: Never used  Substance and Sexual Activity   Alcohol use: No    Alcohol/week: 0.0 standard drinks of alcohol   Drug use: No   Sexual activity: Not Currently    Partners: Male    Birth control/protection: Post-menopausal  Other Topics Concern   Not on file  Social History Narrative   Married. 2 children. 2 grandkids 5 and 1.5 in 05/2017.       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      Lifestyle: regular walking, healthy diet   Hobbies: travel, camping, hike, bike, church- [employer]   Social Determinants of Health   Financial Resource Strain: [place]  (09/05/2022)   Overall Financial Resource Strain (CARDIA)    Difficulty of Paying Living Expenses: Not hard at all  Food Insecurity: No Food Insecurity (09/05/2022)   Hunger Vital Sign    Worried About Running Out of Food in the Last Year: Never true    [place] in the Last Year: Never true  Transportation Needs: No Transportation Needs (09/05/2022)   PRAPARE - Hydrologist (Medical): No    Lack of Transportation (Non-Medical): No  Physical Activity: Sufficiently Active (09/05/2022)   Exercise Vital Sign    Days of Exercise per Week: 7 days    Minutes of Exercise per Session: 30 min  Stress: No Stress Concern Present (09/05/2022)   [place]    Feeling of Stress : Not at all  Social Connections: Moderately Integrated (09/05/2022)   Social Connection and Isolation Panel [NHANES]    Frequency of Communication with Friends and Family: More than three times a week    Frequency of  Social Gatherings with Friends and Family: Not on file    Attends Religious Services: More than 4 times per year    Active Member of [employer] or Organizations: No    Attends Music therapist: Never    Marital Status: Married    Tobacco Counseling Counseling given: Not Answered   Clinical Intake:  Pre-visit preparation completed: Yes  Pain : No/denies pain     BMI - recorded: 21.4 Nutritional Status: BMI of 19-24  Normal Nutritional Risks: None Diabetes: No  How often do you need to have someone help you when you read instructions, pamphlets, or other written materials from your doctor or pharmacy?: 1 - Never  Diabetic?no  Interpreter Needed?: No  Information entered by :: Charlott Rakes, LPN   Activities of Daily Living    09/05/2022    7:58 AM  In your present state of health, do you have any difficulty performing the following activities:  Hearing? 0  Vision? 0  Difficulty concentrating or making decisions? 0  Walking or climbing stairs? 0  Dressing or bathing? 0  Doing errands, shopping? 0  Preparing Food and eating ? N  Using the Toilet? N  In the past six months, have you accidently leaked urine? N  Do you have problems with loss of bowel control? N  Managing your Medications? N  Managing your Finances? N  Housekeeping or managing your Housekeeping? N    Patient Care Team: Marin Olp, MD as PCP - General (Family Medicine) Kristeen Miss, MD as Consulting Physician (Neurosurgery) Megan Salon, MD as Consulting Physician (Gynecology) Jari Pigg, MD as Consulting Physician (Dermatology)  Indicate any recent Medical Services you may have received from other  than [facility] providers in the past year (date may be approximate).     Assessment:   This is a routine wellness examination for Cheyenne Myers.  Hearing/Vision screen Hearing Screening - Comments:: Pt denies any hearing issues  Vision Screening - Comments:: Pt follows up with Dr Prudencio Burly for annual eye exams   Dietary issues and exercise activities discussed: Current Exercise Habits: Home exercise routine, Type of exercise: walking, Time (Minutes): 30, Frequency (Times/Week): 7, Weekly Exercise (Minutes/Week): 210   Goals Addressed             This Visit's Progress    Patient Stated       Maintain health        Depression Screen    09/05/2022    7:56 AM 02/21/2022   11:28 AM 07/16/2021    8:04 AM 07/06/2020    9:50 AM 05/27/2019    3:12 PM 05/27/2019    3:08 PM 04/21/2018    9:41 AM  PHQ 2/9 Scores  PHQ - 2 Score 0 0 0 0 0 0 0  PHQ- 9 Score    1       Fall Risk    09/05/2022    7:58 AM 07/18/2022    7:55 AM 07/16/2021    8:04 AM  Fall Risk   Falls in the past year? 0 0 0  Number falls in past yr: 0 0 0  Injury with Fall? 0 0 0  Risk for fall due to : No Fall Risks;Impaired vision No Fall Risks No Fall Risks  Follow up Falls prevention discussed Falls evaluation completed Falls evaluation completed    [place]:  Any stairs in or around the home? Yes  If so, are there any without handrails? No  Home free of loose throw rugs in walkways, pet beds, electrical cords, etc? Yes  Adequate lighting in your home to reduce risk of falls? Yes   ASSISTIVE DEVICES UTILIZED TO PREVENT FALLS:  Life alert? No  Use of a cane, walker or w/c? No  Grab bars in the bathroom? No  Shower chair or bench in shower? No  Elevated toilet seat or a handicapped toilet? No   TIMED UP AND GO:  Was the test performed? No .   Cognitive Function:        09/05/2022    8:00 AM  6CIT Screen  What Year? 0 points  What month? 0 points  What time? 0 points  Count back  from 20 0 points  Months in reverse 0 points  Repeat phrase 0 points  Total Score 0 points    Immunizations Immunization History  Administered Date(s) Administered   Influenza Split 10/14/2012   Influenza Whole 10/26/2009   Influenza,inj,Quad PF,6+ Mos 08/29/2013, 09/29/2017, 09/28/2018   Influenza-Unspecified 09/21/2014, 09/05/2015, 09/02/2016   Td 11/21/2006   Tdap 10/23/2015   Zoster Recombinat (Shingrix) 04/21/2018, 06/30/2018   Zoster, Live 09/22/2016    TDAP status: Up to date  Flu Vaccine status: Declined, Education has been provided regarding the importance of this vaccine but patient still declined. Advised may receive this vaccine at local pharmacy or Health Dept. Aware to provide a copy of the vaccination record if obtained from local pharmacy or Health Dept. Verbalized acceptance and understanding.  Pneumococcal vaccine status: Declined,  Education has been provided regarding the importance of this vaccine but patient still declined. Advised may receive this vaccine at local pharmacy or Health Dept. Aware to provide a copy of the vaccination record if obtained from local pharmacy or Health Dept. Verbalized acceptance and understanding.   Covid-19 vaccine status: Declined, Education has been provided regarding the importance of this vaccine but patient still declined. Advised may receive this vaccine at local pharmacy or Health Dept.or vaccine clinic. Aware to provide a copy of the vaccination record if obtained from local pharmacy or Health Dept. Verbalized acceptance and understanding.  Qualifies for Shingles Vaccine? Yes   Zostavax completed Yes   Shingrix Completed?: Yes  Screening Tests Health Maintenance  Topic Date Due   INFLUENZA VACCINE  03/22/2023 (Originally 07/22/2022)   MAMMOGRAM  10/07/2022   DEXA SCAN  01/06/2025   PAP SMEAR-Modifier  02/21/2025   TETANUS/TDAP  10/22/2025   COLONOSCOPY (Pts 45-23yr Insurance coverage will need to be confirmed)   09/03/2026   Hepatitis C Screening  Completed   HIV Screening  Completed   Zoster Vaccines- Shingrix  Completed   HPV VACCINES  Aged Out   Pneumonia Vaccine 66 Years old  Discontinued   COVID-19 Vaccine  Discontinued    Health Maintenance  There are no preventive care reminders to display for this patient.   Colorectal cancer screening: Type of screening: Colonoscopy. Completed 09/03/21. Repeat every 5 years  Mammogram status: Completed 10/07/21. Repeat every year  Bone Density status: Completed 01/06/22. Results reflect: Bone density results: OSTEOPENIA. Repeat every 3 years.   Additional Screening:  Hepatitis C Screening: Completed 11/29/18  Vision Screening: Recommended annual ophthalmology exams for early detection of glaucoma and other disorders of the eye. Is the patient up to date with their annual eye exam?  Yes  Who is the provider or what is the name of the office in which the patient attends annual eye exams? Dr LPrudencio Burly If pt is not established  with a provider, would they like to be referred to a provider to establish care? No .   Dental Screening: Recommended annual dental exams for proper oral hygiene  Community Resource Referral / Chronic Care Management: CRR required this visit?  No   CCM required this visit?  No      Plan:     I have personally reviewed and noted the following in the patient's chart:   Medical and social history Use of alcohol, tobacco or illicit drugs  Current medications and supplements including opioid prescriptions. Patient is not currently taking opioid prescriptions. Functional ability and status Nutritional status Physical activity Advanced directives List of other physicians Hospitalizations, surgeries, and ER visits in previous 12 months Vitals Screenings to include cognitive, depression, and falls Referrals and appointments  In addition, I have reviewed and discussed with patient certain preventive protocols, quality  metrics, and best practice recommendations. A written personalized care plan for preventive services as well as general preventive health recommendations were provided to patient.     Willette Brace, LPN   [id number]   Nurse Notes: none

## 2022-09-05 NOTE — Patient Instructions (Signed)
Cheyenne Myers , Thank you for taking time to come for your Medicare Wellness Visit. I appreciate your ongoing commitment to your health goals. Please review the following plan we discussed and let me know if I can assist you in the future.   Screening recommendations/referrals: Colonoscopy: done 09/03/21 repeat every 5 years  Mammogram: done 10/07/21 repeat every year scheduled 10/23/22 Bone Density: done 01/06/22 repeat every 3 years  Recommended yearly ophthalmology/optometry visit for glaucoma screening and checkup Recommended yearly dental visit for hygiene and checkup  Vaccinations: Influenza vaccine: declined  Pneumococcal vaccine: declined  Tdap vaccine: done 10/23/15 repeat every 10 years  Shingles vaccine: completed 5/1, 06/30/18    Covid-19:declined and discussed   Advanced directives: copies in chart   Conditions/risks identified: maintain health   Next appointment: Follow up in one year for your annual wellness visit    Preventive Care 49 Years and Older, Female Preventive care refers to lifestyle choices and visits with your health care provider that can promote health and wellness. What does preventive care include? A yearly physical exam. This is also called an annual well check. Dental exams once or twice a year. Routine eye exams. Ask your health care provider how often you should have your eyes checked. Personal lifestyle choices, including: Daily care of your teeth and gums. Regular physical activity. Eating a healthy diet. Avoiding tobacco and drug use. Limiting alcohol use. Practicing safe sex. Taking low-dose aspirin every day. Taking vitamin and mineral supplements as recommended by your health care provider. What happens during an annual well check? The services and screenings done by your health care provider during your annual well check will depend on your age, overall health, lifestyle risk factors, and family history of disease. Counseling  Your health care  provider may ask you questions about your: Alcohol use. Tobacco use. Drug use. Emotional well-being. Home and relationship well-being. Sexual activity. Eating habits. History of falls. Memory and ability to understand (cognition). Work and work Statistician. Reproductive health. Screening  You may have the following tests or measurements: Height, weight, and BMI. Blood pressure. Lipid and cholesterol levels. These may be checked every 5 years, or more frequently if you are over 29 years old. Skin check. Lung cancer screening. You may have this screening every year starting at age 39 if you have a 30-pack-year history of smoking and currently smoke or have quit within the past 15 years. Fecal occult blood test (FOBT) of the stool. You may have this test every year starting at age 1. Flexible sigmoidoscopy or colonoscopy. You may have a sigmoidoscopy every 5 years or a colonoscopy every 10 years starting at age 23. Hepatitis C blood test. Hepatitis B blood test. Sexually transmitted disease (STD) testing. Diabetes screening. This is done by checking your blood sugar (glucose) after you have not eaten for a while (fasting). You may have this done every 1-3 years. Bone density scan. This is done to screen for osteoporosis. You may have this done starting at age 40. Mammogram. This may be done every 1-2 years. Talk to your health care provider about how often you should have regular mammograms. Talk with your health care provider about your test results, treatment options, and if necessary, the need for more tests. Vaccines  Your health care provider may recommend certain vaccines, such as: Influenza vaccine. This is recommended every year. Tetanus, diphtheria, and acellular pertussis (Tdap, Td) vaccine. You may need a Td booster every 10 years. Zoster vaccine. You may need this after  age 30. Pneumococcal 13-valent conjugate (PCV13) vaccine. One dose is recommended after age  80. Pneumococcal polysaccharide (PPSV23) vaccine. One dose is recommended after age 30. Talk to your health care provider about which screenings and vaccines you need and how often you need them. This information is not intended to replace advice given to you by your health care provider. Make sure you discuss any questions you have with your health care provider. Document Released: 01/04/2016 Document Revised: 08/27/2016 Document Reviewed: 10/09/2015 Elsevier Interactive Patient Education  2017 [place] Prevention in the Home Falls can cause injuries. They can happen to people of all ages. There are many things you can do to make your home safe and to help prevent falls. What can I do on the outside of my home? Regularly fix the edges of walkways and driveways and fix any cracks. Remove anything that might make you trip as you walk through a door, such as a raised step or threshold. Trim any bushes or trees on the path to your home. Use bright outdoor lighting. Clear any walking paths of anything that might make someone trip, such as rocks or tools. Regularly check to see if handrails are loose or broken. Make sure that both sides of any steps have handrails. Any raised decks and porches should have guardrails on the edges. Have any leaves, snow, or ice cleared regularly. Use sand or salt on walking paths during winter. Clean up any spills in your garage right away. This includes oil or grease spills. What can I do in the bathroom? Use night lights. Install grab bars by the toilet and in the tub and shower. Do not use towel bars as grab bars. Use non-skid mats or decals in the tub or shower. If you need to sit down in the shower, use a plastic, non-slip stool. Keep the floor dry. Clean up any water that spills on the floor as soon as it happens. Remove soap buildup in the tub or shower regularly. Attach bath mats securely with double-sided non-slip rug tape. Do not have throw  rugs and other things on the floor that can make you trip. What can I do in the bedroom? Use night lights. Make sure that you have a light by your bed that is easy to reach. Do not use any sheets or blankets that are too big for your bed. They should not hang down onto the floor. Have a firm chair that has side arms. You can use this for support while you get dressed. Do not have throw rugs and other things on the floor that can make you trip. What can I do in the kitchen? Clean up any spills right away. Avoid walking on wet floors. Keep items that you use a lot in easy-to-reach places. If you need to reach something above you, use a strong step stool that has a grab bar. Keep electrical cords out of the way. Do not use floor polish or wax that makes floors slippery. If you must use wax, use non-skid floor wax. Do not have throw rugs and other things on the floor that can make you trip. What can I do with my stairs? Do not leave any items on the stairs. Make sure that there are handrails on both sides of the stairs and use them. Fix handrails that are broken or loose. Make sure that handrails are as long as the stairways. Check any carpeting to make sure that it is firmly attached to the stairs.  Fix any carpet that is loose or worn. Avoid having throw rugs at the top or bottom of the stairs. If you do have throw rugs, attach them to the floor with carpet tape. Make sure that you have a light switch at the top of the stairs and the bottom of the stairs. If you do not have them, ask someone to add them for you. What else can I do to help prevent falls? Wear shoes that: Do not have high heels. Have rubber bottoms. Are comfortable and fit you well. Are closed at the toe. Do not wear sandals. If you use a stepladder: Make sure that it is fully opened. Do not climb a closed stepladder. Make sure that both sides of the stepladder are locked into place. Ask someone to hold it for you, if  possible. Clearly mark and make sure that you can see: Any grab bars or handrails. First and last steps. Where the edge of each step is. Use tools that help you move around (mobility aids) if they are needed. These include: Canes. Walkers. Scooters. Crutches. Turn on the lights when you go into a dark area. Replace any light bulbs as soon as they burn out. Set up your furniture so you have a clear path. Avoid moving your furniture around. If any of your floors are uneven, fix them. If there are any pets around you, be aware of where they are. Review your medicines with your doctor. Some medicines can make you feel dizzy. This can increase your chance of falling. Ask your doctor what other things that you can do to help prevent falls. This information is not intended to replace advice given to you by your health care provider. Make sure you discuss any questions you have with your health care provider. Document Released: 10/04/2009 Document Revised: 05/15/2016 Document Reviewed: 01/12/2015 Elsevier Interactive Patient Education  2017 [employer].

## 2022-09-12 DIAGNOSIS — Z961 Presence of intraocular lens: Secondary | ICD-10-CM | POA: Diagnosis not present

## 2022-09-12 DIAGNOSIS — H401131 Primary open-angle glaucoma, bilateral, mild stage: Secondary | ICD-10-CM | POA: Diagnosis not present

## 2022-09-29 ENCOUNTER — Other Ambulatory Visit: Payer: Self-pay

## 2022-09-29 ENCOUNTER — Encounter: Payer: Self-pay | Admitting: Family Medicine

## 2022-09-29 MED ORDER — ROSUVASTATIN CALCIUM 5 MG PO TABS: 5.0000 mg | ORAL_TABLET | ORAL | 3 refills | Status: DC

## 2022-10-20 ENCOUNTER — Encounter: Payer: Self-pay | Admitting: Family Medicine

## 2022-10-21 ENCOUNTER — Other Ambulatory Visit: Payer: Self-pay

## 2022-10-21 DIAGNOSIS — E785 Hyperlipidemia, unspecified: Secondary | ICD-10-CM

## 2022-10-23 ENCOUNTER — Ambulatory Visit
Admission: RE | Admit: 2022-10-23 | Discharge: 2022-10-23 | Disposition: A | Discharge status: Home / Self Care | Payer: PPO | Source: Ambulatory Visit | Attending: Family Medicine | Admitting: Family Medicine

## 2022-10-23 DIAGNOSIS — Z1231 Encounter for screening mammogram for malignant neoplasm of breast: Secondary | ICD-10-CM

## 2022-10-24 DIAGNOSIS — L570 Actinic keratosis: Secondary | ICD-10-CM | POA: Diagnosis not present

## 2022-10-24 DIAGNOSIS — D2362 Other benign neoplasm of skin of left upper limb, including shoulder: Secondary | ICD-10-CM | POA: Diagnosis not present

## 2022-10-24 DIAGNOSIS — D225 Melanocytic nevi of trunk: Secondary | ICD-10-CM | POA: Diagnosis not present

## 2022-10-24 DIAGNOSIS — L720 Epidermal cyst: Secondary | ICD-10-CM | POA: Diagnosis not present

## 2022-10-24 DIAGNOSIS — Z85828 Personal history of other malignant neoplasm of skin: Secondary | ICD-10-CM | POA: Diagnosis not present

## 2022-10-24 DIAGNOSIS — L821 Other seborrheic keratosis: Secondary | ICD-10-CM | POA: Diagnosis not present

## 2022-10-24 DIAGNOSIS — L80 Vitiligo: Secondary | ICD-10-CM | POA: Diagnosis not present

## 2022-10-24 DIAGNOSIS — L918 Other hypertrophic disorders of the skin: Secondary | ICD-10-CM | POA: Diagnosis not present

## 2022-10-24 DIAGNOSIS — D485 Neoplasm of uncertain behavior of skin: Secondary | ICD-10-CM | POA: Diagnosis not present

## 2022-10-24 DIAGNOSIS — L578 Other skin changes due to chronic exposure to nonionizing radiation: Secondary | ICD-10-CM | POA: Diagnosis not present

## 2022-10-24 DIAGNOSIS — L658 Other specified nonscarring hair loss: Secondary | ICD-10-CM | POA: Diagnosis not present

## 2022-10-24 DIAGNOSIS — C44519 Basal cell carcinoma of skin of other part of trunk: Secondary | ICD-10-CM | POA: Diagnosis not present

## 2022-10-27 ENCOUNTER — Encounter (HOSPITAL_BASED_OUTPATIENT_CLINIC_OR_DEPARTMENT_OTHER): Payer: Self-pay | Admitting: Obstetrics & Gynecology

## 2022-10-27 ENCOUNTER — Other Ambulatory Visit: Payer: PPO

## 2022-11-03 ENCOUNTER — Other Ambulatory Visit (HOSPITAL_BASED_OUTPATIENT_CLINIC_OR_DEPARTMENT_OTHER): Payer: PPO

## 2022-11-03 DIAGNOSIS — Z780 Asymptomatic menopausal state: Secondary | ICD-10-CM

## 2022-11-05 LAB — TESTOSTERONE, TOTAL, LC/MS/MS: Testosterone, total: 29.1 ng/dL (ref 7.0–40.0)

## 2022-11-05 LAB — ESTRADIOL: Estradiol: 23.1 pg/mL (ref 0.0–54.7)

## 2022-11-05 LAB — PROGESTERONE: Progesterone: 2.1 ng/mL

## 2022-11-06 ENCOUNTER — Encounter (HOSPITAL_BASED_OUTPATIENT_CLINIC_OR_DEPARTMENT_OTHER): Payer: Self-pay | Admitting: Obstetrics & Gynecology

## 2022-11-06 ENCOUNTER — Encounter (HOSPITAL_BASED_OUTPATIENT_CLINIC_OR_DEPARTMENT_OTHER): Payer: Self-pay | Admitting: Cardiology

## 2022-11-06 ENCOUNTER — Ambulatory Visit (HOSPITAL_BASED_OUTPATIENT_CLINIC_OR_DEPARTMENT_OTHER): Payer: PPO | Admitting: Cardiology

## 2022-11-06 VITALS — BP 120/84 | HR 67 | Ht 61.0 in | Wt 112.0 lb

## 2022-11-06 DIAGNOSIS — Z1589 Genetic susceptibility to other disease: Secondary | ICD-10-CM

## 2022-11-06 DIAGNOSIS — Z7189 Other specified counseling: Secondary | ICD-10-CM | POA: Diagnosis not present

## 2022-11-06 DIAGNOSIS — E7800 Pure hypercholesterolemia, unspecified: Secondary | ICD-10-CM

## 2022-11-06 DIAGNOSIS — I2510 Atherosclerotic heart disease of native coronary artery without angina pectoris: Secondary | ICD-10-CM | POA: Diagnosis not present

## 2022-11-06 NOTE — Patient Instructions (Signed)
Medication Instructions:  Your physician recommends that you continue on your current medications as directed. Please refer to the Current Medication list given to you today.   *If you need a refill on your cardiac medications before your next appointment, please call your pharmacy*  Lab Work: [place]   If you have labs (blood work) drawn today and your tests are completely normal, you will receive your results only by: [place] (if you have MyChart) OR A paper copy in the mail If you have any lab test that is abnormal or we need to change your treatment, we will call you to review the results.  Testing/Procedures: NONE  Follow-Up: At [facility], you and your health needs are our priority.  As part of our continuing mission to provide you with exceptional heart care, we have created designated Provider Care Teams.  These Care Teams include your primary Cardiologist (physician) and Advanced Practice Providers (APPs -  Physician Assistants and Nurse Practitioners) who all work together to provide you with the care you need, when you need it.  We recommend signing up for the patient portal called "MyChart".  Sign up information is provided on this After Visit Summary.  MyChart is used to connect with patients for Virtual Visits (Telemedicine).  Patients are able to view lab/test results, encounter notes, upcoming appointments, etc.  Non-urgent messages can be sent to your provider as well.   To learn more about what you can do with MyChart, go to [email or web address].    Your next appointment:   12 month(s)  The format for your next appointment:   In Person  Provider:   Buford Dresser, MD

## 2022-11-06 NOTE — Progress Notes (Signed)
Cardiology Office Note:    Date:  11/09/2022   ID:  Cheyenne Myers, DOB Nov 04, 1956, MRN [id number]  PCP:  Marin Olp, MD  Cardiologist:  Buford Dresser, MD  Referring MD: Marin Olp, MD   CC: New patient evaluation for hyperlipidemia.   History of Present Illness:    Cheyenne Myers is a 66 y.o. female with a hx of mitral valve prolapse, hyperlipidemia, who is seen as a new consult at the request of Marin Olp, MD for the evaluation and management of hyperlipidemia.  She last saw Dr. Yong Channel on 07/18/2022. She was referred for a CT calcium scoring that revealed a coronary calcium score of 2 (53rd percentile, LAD artery).  On 10/20/2022 she messaged the office requesting cardiology referral. She wished to pursue more information after starting rosuvastatin and given her ApoB status.  Cardiovascular risk factors: Prior clinical ASCVD:  Comorbid conditions: Hyperlipidemia - On rosuvastatin for the past month. She is taking it twice a week. So far she is tolerating statin therapy. Family history:  Her biological father died at 64 yo of a heart attack. Her mother had alzheimer's. Her 2 sisters also are diagnosed with alzheimer's, had prior strokes. Prior cardiac testing and/or incidental findings on other testing (ie coronary calcium): CT scan 07/25/2022 revealed calcium score of 2 (53rd percentile).  Exercise level: Walking 2-3 miles every day. She also participates in exercise classes. Her arthritis limits her from very strenuous activities. Current diet: She is usually conscientious of following a healthy diet with whole foods and low carbs. Generally she favors fish over beef, and cooks with olive oil. Her supplements include fish oil and krill oil.  In 2019 she suffered a tick bite and became "super sick". Unknown if lyme disease. Of note, she has never felt like she returned to her baseline prior to 2019. However, she is feeling better today than she has in a  while.  She denies any palpitations, chest pain, shortness of breath, or peripheral edema. No lightheadedness, headaches, syncope, orthopnea, or PND.  In January she had cataract surgery. She has eye floaters frequently.  She came prepared with many questions regarding her health and next steps. We discussed these at length to the patient's understanding and satisfaction.  Past Medical History:  Diagnosis Date   Abnormal Pap smear 06/2004   ASCUS, + HPV, CIN II LEEP   Arthritis    Cataract    Cervical radicular pain    managed by [facility]   Epstein Barr infection    Glaucoma    Heart murmur    Hx of adenomatous polyp of colon 02/13/2016   Hyperlipemia    MITRAL VALVE PROLAPSE 08/05/2007   Qualifier: Diagnosis of  By: Tiney Rouge CMA, [place]      Past Surgical History:  Procedure Laterality Date   CARPAL TUNNEL RELEASE Right 08/2011   CATARACT EXTRACTION Bilateral    CERVICAL BIOPSY  W/ LOOP ELECTRODE EXCISION  06/2004   CIN II   COLONOSCOPY     COLPOSCOPY     WISDOM TOOTH EXTRACTION  age 89    Current Medications: Current Outpatient Medications on File Prior to Visit  Medication Sig   Cholecalciferol (VITAMIN D3 PO) Take 5,000 Units by mouth.   Cyanocobalamin (VITAMIN B 12 PO) Take by mouth.   Glucosamine 750 MG TABS Take 2 tablets by mouth daily.   KRILL OIL 1000 MG CAPS Take 2 capsules (2,000 mg total) by mouth 2 (two) times daily.  latanoprost (XALATAN) 0.005 % ophthalmic solution    Menaquinone-7 (VITAMIN K2 PO) Take by mouth every other day.    NALTREXONE HCL PO Take 3 mg by mouth. For immune support- through integrative health   NONFORMULARY OR COMPOUNDED ITEM Biest 50/50 2 clocks 1/2 cc day   Omega-3 1000 MG CAPS Take by mouth.   Pregnenolone Micronized (PREGNENOLONE PO) Take by mouth.   Probiotic Product (PROBIOTIC DAILY PO) Take by mouth daily.   PROGESTERONE MICRONIZED PO Take 100 mg by mouth.   Pyridoxine HCl (B-6) 50 MG TABS Take by mouth.   rosuvastatin  (CRESTOR) 5 MG tablet Take 1 tablet (5 mg total) by mouth 2 (two) times a week.   Ubiquinol 100 MG CAPS Take by mouth.   UNABLE TO FIND 500 mg. Curcumin   No current facility-administered medications on file prior to visit.     Allergies:   Patient has no known allergies.   Social History   Tobacco Use   Smoking status: Never   Smokeless tobacco: Never  Vaping Use   Vaping Use: Never used  Substance Use Topics   Alcohol use: No    Alcohol/week: 0.0 standard drinks of alcohol   Drug use: No    Family History: family history includes Alzheimer's disease in her mother; Atrial fibrillation in her sister; Breast cancer in her maternal grandmother; Colon cancer in her sister; Dementia in her sister and sister; Diabetes in her maternal grandfather, maternal grandmother, and mother; Heart attack in her father; Heart disease in her mother; Stroke in her sister; Transient ischemic attack in her sister. There is no history of Colon polyps, Esophageal cancer, Rectal cancer, or Stomach cancer.  ROS:   Please see the history of present illness.  Additional pertinent ROS: Constitutional: Negative for chills, fever, night sweats, unintentional weight loss  HENT: Negative for ear pain and hearing loss.   Eyes: Negative for loss of vision and eye pain.  Respiratory: Negative for cough, sputum, wheezing.   Cardiovascular: See HPI. Gastrointestinal: Negative for abdominal pain, melena, and hematochezia.  Genitourinary: Negative for dysuria and hematuria.  Musculoskeletal: Negative for falls and myalgias.  Skin: Negative for itching and rash.  Neurological: Negative for focal weakness, focal sensory changes and loss of consciousness.  Endo/Heme/Allergies: Does not bruise/bleed easily.     EKGs/Labs/Other Studies Reviewed:    The following studies were reviewed today:  CT Calcium Score  07/25/2022: FINDINGS: Coronary arteries: Normal origins.   Coronary Calcium Score:   Left main:   Left  anterior descending artery: 2   Left circumflex artery:   Right coronary artery:   Total: 2   Percentile: 53   Pericardium: Normal.   Ascending Aorta: Normal caliber.   Non-cardiac: See separate report from [facility].   IMPRESSION: Coronary calcium score of 2. This was 53rd percentile for age-, race-, and sex-matched controls.  Echo 09/27/2021:  1. Left ventricular ejection fraction, by estimation, is 60 to 65%. Left  ventricular ejection fraction by 3D volume is 68 %. The left ventricle has  normal function. The left ventricle has no regional wall motion  abnormalities. Left ventricular diastolic   parameters were normal. The average left ventricular global longitudinal  strain is -24.7 %. The global longitudinal strain is normal.   2. Right ventricular systolic function is normal. The right ventricular  size is normal.   3. The mitral valve is myxomatous. Trivial mitral valve regurgitation. No  evidence of mitral stenosis. There is mild late systolic prolapse  of the  posterior leaflet of the mitral valve.   4. The aortic valve is normal in structure. Aortic valve regurgitation is  not visualized. No aortic stenosis is present.   5. The inferior vena cava is normal in size with greater than 50%  respiratory variability, suggesting right atrial pressure of 3 mmHg.   EKG:  EKG is personally reviewed.   11/06/2022:  NSR at 67 bpm  Recent Labs: 01/16/2022: TSH 2.40 07/18/2022: ALT 16; BUN 13; Creatinine, Ser 0.75; Hemoglobin 14.5; Platelets 215.0; Potassium 4.1; Sodium 140   Recent Lipid Panel    Component Value Date/Time   CHOL 265 (A) 01/16/2022 0000   TRIG 78 01/16/2022 0000   TRIG 70 10/26/2006 1019   HDL 86 (A) 01/16/2022 0000   CHOLHDL 3 07/16/2021 0846   VLDL 9.4 07/16/2021 0846   LDLCALC 166 01/16/2022 0000   LDLCALC 137 (H) 05/27/2019 1552   LDLDIRECT 134.3 03/01/2012 0836    Physical Exam:    VS:  BP 120/84   Pulse 67   Ht '5\' 1"'$  (1.549 m)    Wt 112 lb (50.8 kg)   LMP 10/17/2009 (Exact Date)   BMI 21.16 kg/m     Wt Readings from Last 3 Encounters:  11/06/22 112 lb (50.8 kg)  07/18/22 109 lb 9.6 oz (49.7 kg)  02/21/22 104 lb 12.8 oz (47.5 kg)    GEN: Well nourished, well developed in no acute distress HEENT: Normal, moist mucous membranes NECK: No JVD CARDIAC: regular rhythm, normal S1 and S2, no rubs or gallops. No murmur. VASCULAR: Radial and DP pulses 2+ bilaterally. No carotid bruits RESPIRATORY:  Clear to auscultation without rales, wheezing or rhonchi  ABDOMEN: Soft, non-tender, non-distended MUSCULOSKELETAL:  Ambulates independently SKIN: Warm and dry, no edema NEUROLOGIC:  Alert and oriented x 3. No focal neuro deficits noted. PSYCHIATRIC:  Normal affect    ASSESSMENT:    1. Coronary artery calcification seen on CT scan   2. APOE*2/*4 genotype   3. Pure hypercholesterolemia   4. Cardiac risk counseling   5. Counseling on health promotion and disease prevention    PLAN:    Coronary artery calcification Self-reported ApoE 2/4 genotype Elevated apoB (113, normal <90) Hypercholesterolemia -discussed pathology of cholesterol, how plaques form, that MI/CVA result commonly from acute plaque rupture and not gradual stenosis. Discussed mechanism of statin to both decrease plaque accumulation and stabilize plaque that is already present. Discussed that calcium is a marker for plaque, with decades of validated data regarding average amounts of calcium for age/gender/ethnicity, as well as value of calcium score for risk stratification  -we discussed the data on statins, both in terms of their long term benefit as well as the risk of side effects. Reviewed common misconceptions about statins. Reviewed how we monitor treatment. -we discussed checking lp(a) for additional information. Added: returned normal at 14 -we discussed current available therapies and recommendations beyond statins, depending on test results -she  is tolerating low dose rosuvastatin. Recommended gradually increasing to daily if she feels comfortable doing so  Cardiac risk counseling and prevention recommendations: -recommend heart healthy/Mediterranean diet, with whole grains, fruits, vegetable, fish, lean meats, nuts, and olive oil. Limit salt. -recommend moderate walking, 3-5 times/week for 30-50 minutes each session. Aim for at least 150 minutes.week. Goal should be pace of 3 miles/hours, or walking 1.5 miles in 30 minutes -recommend avoidance of tobacco products. Avoid excess alcohol. -ASCVD risk score: The 10-year ASCVD risk score (Arnett DK, et al., 2019) is: 5.2%*  Values used to calculate the score:     Age: 80 years     Sex: Female     Is Non-Hispanic African American: No     Diabetic: No     Tobacco smoker: No     Systolic Blood Pressure: 664 mmHg     Is BP treated: No     HDL Cholesterol: 86 mg/dL*     Total Cholesterol: 255 mg/dL     * - Cholesterol units were assumed for this score calculation    Plan for follow up: 1 year or sooner as needed.  Buford Dresser, MD, PhD, [facility]    Medication Adjustments/Labs and Tests Ordered: Current medicines are reviewed at length with the patient today.  Concerns regarding medicines are outlined above.   Orders Placed This Encounter  Procedures   Lipoprotein A (LPA)   EKG 12-Lead   No orders of the defined types were placed in this encounter.  Patient Instructions  Medication Instructions:  Your physician recommends that you continue on your current medications as directed. Please refer to the Current Medication list given to you today.   *If you need a refill on your cardiac medications before your next appointment, please call your pharmacy*  Lab Work: [place]   If you have labs (blood work) drawn today and your tests are completely normal, you will receive your results only by: [place] (if you have MyChart) OR A paper  copy in the mail If you have any lab test that is abnormal or we need to change your treatment, we will call you to review the results.  Testing/Procedures: NONE  Follow-Up: At [facility], you and your health needs are our priority.  As part of our continuing mission to provide you with exceptional heart care, we have created designated Provider Care Teams.  These Care Teams include your primary Cardiologist (physician) and Advanced Practice Providers (APPs -  Physician Assistants and Nurse Practitioners) who all work together to provide you with the care you need, when you need it.  We recommend signing up for the patient portal called "MyChart".  Sign up information is provided on this After Visit Summary.  MyChart is used to connect with patients for Virtual Visits (Telemedicine).  Patients are able to view lab/test results, encounter notes, upcoming appointments, etc.  Non-urgent messages can be sent to your provider as well.   To learn more about what you can do with MyChart, go to [email or web address].    Your next appointment:   12 month(s)  The format for your next appointment:   In Person  Provider:   Buford Dresser, MD               [facility] Stumpf,acting as a scribe for Buford Dresser, MD.,have documented all relevant documentation on the behalf of Buford Dresser, MD,as directed by  Buford Dresser, MD while in the presence of Buford Dresser, MD.  I, Buford Dresser, MD, have reviewed all documentation for this visit. The documentation on 11/09/22 for the exam, diagnosis, procedures, and orders are all accurate and complete.   Signed, Buford Dresser, MD PhD 11/09/2022 10:23 PM    [place]

## 2022-11-07 LAB — LIPOPROTEIN A (LPA): Lipoprotein (a): 65.2 nmol/L (ref <75.0)

## 2022-11-09 ENCOUNTER — Encounter (HOSPITAL_BASED_OUTPATIENT_CLINIC_OR_DEPARTMENT_OTHER): Payer: Self-pay | Admitting: Cardiology

## 2022-11-10 ENCOUNTER — Encounter (HOSPITAL_BASED_OUTPATIENT_CLINIC_OR_DEPARTMENT_OTHER): Payer: Self-pay

## 2022-11-10 NOTE — Telephone Encounter (Signed)
Please advise 

## 2022-11-11 NOTE — Telephone Encounter (Signed)
Please advise 

## 2022-12-03 DIAGNOSIS — C44519 Basal cell carcinoma of skin of other part of trunk: Secondary | ICD-10-CM | POA: Diagnosis not present

## 2022-12-09 ENCOUNTER — Encounter: Payer: Self-pay | Admitting: Family Medicine

## 2022-12-09 DIAGNOSIS — E785 Hyperlipidemia, unspecified: Secondary | ICD-10-CM

## 2022-12-10 DIAGNOSIS — H52203 Unspecified astigmatism, bilateral: Secondary | ICD-10-CM | POA: Diagnosis not present

## 2022-12-10 DIAGNOSIS — Z961 Presence of intraocular lens: Secondary | ICD-10-CM | POA: Diagnosis not present

## 2022-12-10 DIAGNOSIS — H43813 Vitreous degeneration, bilateral: Secondary | ICD-10-CM | POA: Diagnosis not present

## 2022-12-10 NOTE — Telephone Encounter (Signed)
Pt was called and scheduled to come in tomorrow at 9:30am, pt stated she will be in a 8am so she is able to come in and eat breakfast.

## 2022-12-11 ENCOUNTER — Other Ambulatory Visit (INDEPENDENT_AMBULATORY_CARE_PROVIDER_SITE_OTHER): Payer: PPO

## 2022-12-11 DIAGNOSIS — E785 Hyperlipidemia, unspecified: Secondary | ICD-10-CM | POA: Diagnosis not present

## 2022-12-11 LAB — LIPID PANEL
Cholesterol: 175 mg/dL (ref 0–200)
HDL: 75.8 mg/dL (ref >39.00)
LDL Cholesterol: 86 mg/dL (ref 0–99)
NonHDL: 99.08
Total CHOL/HDL Ratio: 2
Triglycerides: 65 mg/dL (ref 0.0–149.0)
VLDL: 13 mg/dL (ref 0.0–40.0)

## 2022-12-11 LAB — COMPREHENSIVE METABOLIC PANEL
ALT: 14 U/L (ref 0–35)
AST: 18 U/L (ref 0–37)
Albumin: 4.2 g/dL (ref 3.5–5.2)
Alkaline Phosphatase: 23 U/L — ABNORMAL LOW (ref 39–117)
BUN: 16 mg/dL (ref 6–23)
CO2: 29 mEq/L (ref 19–32)
Calcium: 9 mg/dL (ref 8.4–10.5)
Chloride: 103 mEq/L (ref 96–112)
Creatinine, Ser: 0.73 mg/dL (ref 0.40–1.20)
GFR: 85.8 mL/min (ref >60.00)
Glucose, Bld: 89 mg/dL (ref 70–99)
Potassium: 3.8 mEq/L (ref 3.5–5.1)
Sodium: 140 mEq/L (ref 135–145)
Total Bilirubin: 0.9 mg/dL (ref 0.2–1.2)
Total Protein: 6.3 g/dL (ref 6.0–8.3)

## 2022-12-12 LAB — APOLIPOPROTEIN B: Apolipoprotein B: 81 mg/dL (ref <90)

## 2022-12-12 LAB — HOMOCYSTEINE: Homocysteine: 6.8 umol/L (ref <10.4)

## 2022-12-25 ENCOUNTER — Encounter: Payer: Self-pay | Admitting: Family Medicine

## 2022-12-25 DIAGNOSIS — R748 Abnormal levels of other serum enzymes: Secondary | ICD-10-CM

## 2022-12-26 ENCOUNTER — Other Ambulatory Visit (INDEPENDENT_AMBULATORY_CARE_PROVIDER_SITE_OTHER): Payer: PPO

## 2022-12-26 DIAGNOSIS — R748 Abnormal levels of other serum enzymes: Secondary | ICD-10-CM

## 2022-12-26 LAB — T3, FREE: T3, Free: 2.8 pg/mL (ref 2.3–4.2)

## 2022-12-26 LAB — T4, FREE: Free T4: 0.8 ng/dL (ref 0.60–1.60)

## 2022-12-26 LAB — TSH: TSH: 1.48 u[IU]/mL (ref 0.35–5.50)

## 2023-01-01 DIAGNOSIS — M2241 Chondromalacia patellae, right knee: Secondary | ICD-10-CM | POA: Diagnosis not present

## 2023-01-01 DIAGNOSIS — M25561 Pain in right knee: Secondary | ICD-10-CM | POA: Diagnosis not present

## 2023-01-06 DIAGNOSIS — M25561 Pain in right knee: Secondary | ICD-10-CM | POA: Diagnosis not present

## 2023-02-06 DIAGNOSIS — L309 Dermatitis, unspecified: Secondary | ICD-10-CM | POA: Diagnosis not present

## 2023-02-09 DIAGNOSIS — L239 Allergic contact dermatitis, unspecified cause: Secondary | ICD-10-CM | POA: Diagnosis not present

## 2023-02-13 DIAGNOSIS — L259 Unspecified contact dermatitis, unspecified cause: Secondary | ICD-10-CM | POA: Diagnosis not present

## 2023-02-16 ENCOUNTER — Encounter: Payer: Self-pay | Admitting: Family Medicine

## 2023-02-19 ENCOUNTER — Ambulatory Visit (INDEPENDENT_AMBULATORY_CARE_PROVIDER_SITE_OTHER): Payer: PPO | Admitting: Family Medicine

## 2023-02-19 ENCOUNTER — Encounter: Payer: Self-pay | Admitting: Family Medicine

## 2023-02-19 VITALS — BP 122/70 | HR 73 | Temp 98.0°F | Ht 61.0 in | Wt 113.8 lb

## 2023-02-19 DIAGNOSIS — E785 Hyperlipidemia, unspecified: Secondary | ICD-10-CM

## 2023-02-19 DIAGNOSIS — Z8619 Personal history of other infectious and parasitic diseases: Secondary | ICD-10-CM

## 2023-02-19 MED ORDER — ACYCLOVIR 200 MG PO CAPS: 200.0000 mg | ORAL_CAPSULE | Freq: Every day | ORAL | 3 refills | Status: DC

## 2023-02-19 MED ORDER — ACYCLOVIR 400 MG PO TABS: 200.0000 mg | ORAL_TABLET | Freq: Every day | ORAL | 3 refills | Status: DC

## 2023-02-19 NOTE — Assessment & Plan Note (Signed)
S:patient with frequent cold sores as a child and less frequent as adult. She has read about links between HSV and alzheimers and with er family history this is a major concern for her  A/P: we opted to use preventative dose acycylovir '200mg'$  daily- can bump if still gets cold sore to full tablet 400 mg 3x a day for 5-10 days (also sent capsule version in which she would prefer if covered)

## 2023-02-19 NOTE — Assessment & Plan Note (Signed)
#  CT cardiac score of 2 which is 53rd percentile July 25, 2022 S: Medication:rosuvastatin 5 mg twice a week  Lab Results  Component Value Date   CHOL 175 12/11/2022   HDL 75.80 12/11/2022   LDLCALC 86 12/11/2022   LDLDIRECT 134.3 03/01/2012   TRIG 65.0 12/11/2022   CHOLHDL 2 12/11/2022  A/P:  big improvement in LDL and apo B on twice weekly statin- we opted to continue current medications and recheck 5 years likely (ct calcium though statin may affect results)

## 2023-02-19 NOTE — Progress Notes (Signed)
Phone [phone] In person visit   Subjective:   Cheyenne Myers is a 67 y.o. year old very pleasant female patient who presents for/with See problem oriented charting Chief Complaint  Patient presents with   discuss mychart    (No Mask).    Past Medical History-  Patient Active Problem List   Diagnosis Date Noted   Osteopenia of left femoral neck 10/29/2019    Priority: Medium    Glaucoma 07/28/2018    Priority: Medium    Stenosis of cervical spine 07/18/2014    Priority: Medium    Hyperlipidemia 08/05/2007    Priority: Medium    Vitamin D deficiency 07/18/2022    Priority: Low   Hx of adenomatous polyp of colon 02/13/2016    Priority: Low   H/O cold sores 07/18/2014    Priority: Low   Cervical spondylosis without myelopathy 01/25/2014    Priority: Low    Medications- reviewed and updated Current Outpatient Medications  Medication Sig Dispense Refill   acyclovir (ZOVIRAX) 200 MG capsule Take 1 capsule (200 mg total) by mouth daily. 90 capsule 3   acyclovir (ZOVIRAX) 400 MG tablet Take 0.5 tablets (200 mg total) by mouth daily. 45 tablet 3   Cholecalciferol (VITAMIN D3 PO) Take 5,000 Units by mouth.     Cyanocobalamin (VITAMIN B 12 PO) Take by mouth.     Glucosamine 750 MG TABS Take 2 tablets by mouth daily.     KRILL OIL 1000 MG CAPS Take 2 capsules (2,000 mg total) by mouth 2 (two) times daily.     latanoprost (XALATAN) 0.005 % ophthalmic solution   6   Menaquinone-7 (VITAMIN K2 PO) Take by mouth every other day.      NALTREXONE HCL PO Take 3 mg by mouth. For immune support- through integrative health     NONFORMULARY OR COMPOUNDED ITEM Biest 50/50 2 clocks 1/2 cc day 1 each 3   Omega-3 1000 MG CAPS Take by mouth.     Pregnenolone Micronized (PREGNENOLONE PO) Take by mouth.     Probiotic Product (PROBIOTIC DAILY PO) Take by mouth daily.     PROGESTERONE MICRONIZED PO Take 100 mg by mouth.     Pyridoxine HCl (B-6) 50 MG TABS Take by mouth.     rosuvastatin  (CRESTOR) 5 MG tablet Take 1 tablet (5 mg total) by mouth 2 (two) times a week. 26 tablet 3   Ubiquinol 100 MG CAPS Take by mouth.     UNABLE TO FIND 500 mg. Curcumin     No current facility-administered medications for this visit.     Objective:  BP 122/70   Pulse 73   Temp 98 F (36.7 C)   Ht '5\' 1"'$  (1.549 m)   Wt 113 lb 12.8 oz (51.6 kg)   LMP 10/17/2009 (Exact Date)   SpO2 97%   BMI 21.50 kg/m  Gen: NAD, resting comfortably    Assessment and Plan    # Cold Sores S:patient with frequent cold sores as a child and less frequent as adult. She has read about links between HSV and alzheimers and with er family history this is a major concern for her  A/P: we opted to use preventative dose acycylovir '200mg'$  daily- can bump if still gets cold sore to full tablet 400 mg 3x a day for 5-10 days (also sent capsule version in which she would prefer if covered)   #hyperlipidemia #CT cardiac score of 2 which is 53rd percentile July 25, 2022 S:  Medication:rosuvastatin 5 mg twice a week  Lab Results  Component Value Date   CHOL 175 12/11/2022   HDL 75.80 12/11/2022   LDLCALC 86 12/11/2022   LDLDIRECT 134.3 03/01/2012   TRIG 65.0 12/11/2022   CHOLHDL 2 12/11/2022  A/P:  big improvement in LDL and apo B on twice weekly statin- we opted to continue current medications and recheck 5 years likely (ct calcium though statin may affect results)    Recommended follow up: Return for next already scheduled visit or sooner if needed. Future Appointments  Date Time Provider [place]  07/22/2023  8:00 AM Marin Olp, MD [facility]  09/18/2023  8:00 AM [facility] HEALTH COACH [facility]    Lab/Order associations:   ICD-10-CM   1. H/O cold sores  Z86.19     2. Hyperlipidemia, unspecified hyperlipidemia type  E78.5       Meds ordered this encounter  Medications   acyclovir (ZOVIRAX) 400 MG tablet    Sig: Take 0.5 tablets (200 mg total) by mouth daily.    Dispense:  45  tablet    Refill:  3   acyclovir (ZOVIRAX) 200 MG capsule    Sig: Take 1 capsule (200 mg total) by mouth daily.    Dispense:  90 capsule    Refill:  3    Patient wants whichever acyclovir is cheaper between capsule 200 mg and half of '400mg'$  tablet    Return precautions advised.  Garret Reddish, MD

## 2023-02-19 NOTE — Patient Instructions (Addendum)
If you get a cold sore- take full tablet '400mg'$  3x a day for 5-10 days.   Once a day for prevention of herpes  Recommended follow up: Return for next already scheduled visit or sooner if needed.

## 2023-03-06 LAB — LAB REPORT - SCANNED
A1c: 5.4
EGFR: 82

## 2023-03-18 ENCOUNTER — Ambulatory Visit: Payer: PPO | Admitting: Family Medicine

## 2023-04-08 DIAGNOSIS — N959 Unspecified menopausal and perimenopausal disorder: Secondary | ICD-10-CM | POA: Diagnosis not present

## 2023-04-09 ENCOUNTER — Telehealth: Payer: Self-pay | Admitting: Family Medicine

## 2023-04-09 DIAGNOSIS — M2241 Chondromalacia patellae, right knee: Secondary | ICD-10-CM | POA: Diagnosis not present

## 2023-04-09 DIAGNOSIS — M25561 Pain in right knee: Secondary | ICD-10-CM | POA: Diagnosis not present

## 2023-04-09 NOTE — Telephone Encounter (Signed)
Contacted Wendelyn Breslow Minus to schedule their annual wellness visit. Appointment made for 04/13/2023.  Gabriel Cirri [facility] AWV TEAM Direct Dial [phone]

## 2023-04-09 NOTE — Telephone Encounter (Signed)
Copied from CRM [id number]. Topic: Medicare AWV >> Apr 09, 2023  9:56 AM Gwenith Spitz wrote: Reason for CRM: Called patient to schedule Medicare Annual Wellness Visit (AWV). Left message for patient to call back and schedule Medicare Annual Wellness Visit (AWV).  Last date of AWV: 09/05/2022  Please schedule an appointment at any time with Inetta Fermo, [facility]. Please schedule AWVS with Inetta Fermo, [facility]..  If any questions, please contact me at [phone].  Thank you ,  Gabriel Cirri [facility] AWV TEAM Direct Dial [phone]

## 2023-04-10 DIAGNOSIS — M25561 Pain in right knee: Secondary | ICD-10-CM | POA: Diagnosis not present

## 2023-04-13 ENCOUNTER — Ambulatory Visit (INDEPENDENT_AMBULATORY_CARE_PROVIDER_SITE_OTHER): Payer: PPO

## 2023-04-13 VITALS — Wt 113.0 lb

## 2023-04-13 DIAGNOSIS — Z0000 Encounter for general adult medical examination without abnormal findings: Secondary | ICD-10-CM

## 2023-04-13 NOTE — Progress Notes (Signed)
I connected with  Cheyenne Myers on 04/13/23 by a audio enabled telemedicine application and verified that I am speaking with the correct person using two identifiers.  Patient Location: Home  Provider Location: Office/Clinic  I discussed the limitations of evaluation and management by telemedicine. The patient expressed understanding and agreed to proceed.   Subjective:   Cheyenne Myers is a 67 y.o. female who presents for Medicare Annual (Subsequent) preventive examination.  Review of Systems     Cardiac Risk Factors include: advanced age (>69men, >21 women)     Objective:    Today's Vitals   04/13/23 1007  Weight: 113 lb (51.3 kg)   Body mass index is 21.35 kg/m.     04/13/2023   10:13 AM 09/05/2022    8:05 AM 01/24/2016    3:57 PM  Advanced Directives  Does Patient Have a Medical Advance Directive? Yes Yes Yes  Type of Estate agent of [place];Living will Healthcare Power of [place];Living will Healthcare Power of [place];Living will  Does patient want to make changes to medical advance directive? No - Patient declined No - Patient declined No - Patient declined  Copy of Healthcare Power of Attorney in Chart? Yes - validated most recent copy scanned in chart (See row information) Yes - validated most recent copy scanned in chart (See row information) No - copy requested    Current Medications (verified) Outpatient Encounter Medications as of 04/13/2023  Medication Sig   acyclovir (ZOVIRAX) 200 MG capsule Take 1 capsule (200 mg total) by mouth daily.   Cholecalciferol (VITAMIN D3 PO) Take 5,000 Units by mouth.   Cyanocobalamin (VITAMIN B 12 PO) Take by mouth.   Glucosamine 750 MG TABS Take 2 tablets by mouth daily.   KRILL OIL 1000 MG CAPS Take 2 capsules (2,000 mg total) by mouth 2 (two) times daily.   latanoprost (XALATAN) 0.005 % ophthalmic solution    Menaquinone-7 (VITAMIN K2 PO) Take by mouth every other day.    NALTREXONE HCL PO Take 3 mg by  mouth. For immune support- through integrative health low dose   NONFORMULARY OR COMPOUNDED ITEM Biest 50/50 2 clocks 1/2 cc day   Omega-3 1000 MG CAPS Take by mouth.   Pregnenolone Micronized (PREGNENOLONE PO) Take by mouth.   Probiotic Product (PROBIOTIC DAILY PO) Take by mouth daily.   PROGESTERONE MICRONIZED PO Take 100 mg by mouth.   Pyridoxine HCl (B-6) 50 MG TABS Take by mouth.   rosuvastatin (CRESTOR) 5 MG tablet Take 1 tablet (5 mg total) by mouth 2 (two) times a week.   Ubiquinol 100 MG CAPS Take by mouth.   UNABLE TO FIND 500 mg. Curcumin   [DISCONTINUED] acyclovir (ZOVIRAX) 400 MG tablet Take 0.5 tablets (200 mg total) by mouth daily.   No facility-administered encounter medications on file as of 04/13/2023.    Allergies (verified) Patient has no known allergies.   History: Past Medical History:  Diagnosis Date   Abnormal Pap smear 06/2004   ASCUS, + HPV, CIN II LEEP   Arthritis    Cataract    Cervical radicular pain    managed by [facility]   Epstein Barr infection    Glaucoma    Heart murmur    Hx of adenomatous polyp of colon 02/13/2016   Hyperlipemia    MITRAL VALVE PROLAPSE 08/05/2007   Qualifier: Diagnosis of  By: Briscoe Burns CMA, Bárbara.Apley     Past Surgical History:  Procedure Laterality Date   CARPAL TUNNEL RELEASE  Right 08/2011   CATARACT EXTRACTION Bilateral    CERVICAL BIOPSY  W/ LOOP ELECTRODE EXCISION  06/2004   CIN II   COLONOSCOPY     COLPOSCOPY     WISDOM TOOTH EXTRACTION  age 52   Family History  Problem Relation Age of Onset   Heart disease Mother        age 86   Diabetes Mother    Alzheimer's disease Mother    Heart attack Father        age 63 died- but was smoker.    Stroke Sister        tia and pacer   Atrial fibrillation Sister    Dementia Sister        early   Transient ischemic attack Sister    Dementia Sister        severe emotional disturbance   Colon cancer Sister        53   Diabetes Maternal Grandmother    Breast cancer  Maternal Grandmother    Diabetes Maternal Grandfather    Colon polyps Neg Hx    Esophageal cancer Neg Hx    Rectal cancer Neg Hx    Stomach cancer Neg Hx    Social History   Socioeconomic History   Marital status: Married    Spouse name: Not on file   Number of children: Not on file   Years of education: Not on file   Highest education level: Not on file  Occupational History   Not on file  Tobacco Use   Smoking status: Never   Smokeless tobacco: Never  Vaping Use   Vaping Use: Never used  Substance and Sexual Activity   Alcohol use: No    Alcohol/week: 0.0 standard drinks of alcohol   Drug use: No   Sexual activity: Not Currently    Partners: Male    Birth control/protection: Post-menopausal  Other Topics Concern   Not on file  Social History Narrative   Married. 2 children. 2 grandkids 5 and 1.5 in 05/2017.       Retired Animal nutritionist at [employer] Situation: lives with husband      Lifestyle: regular walking, healthy diet   Hobbies: travel, camping, hike, bike, church- [employer]   Social Determinants of Health   Financial Resource Strain: Low Risk  (04/13/2023)   Overall Financial Resource Strain (CARDIA)    Difficulty of Paying Living Expenses: Not hard at all  Food Insecurity: No Food Insecurity (04/13/2023)   Hunger Vital Sign    Worried About Running Out of Food in the Last Year: Never true    Ran Out of Food in the Last Year: Never true  Transportation Needs: No Transportation Needs (04/13/2023)   PRAPARE - Administrator, Civil Service (Medical): No    Lack of Transportation (Non-Medical): No  Physical Activity: Sufficiently Active (04/13/2023)   Exercise Vital Sign    Days of Exercise per Week: 7 days    Minutes of Exercise per Session: 30 min  Stress: Stress Concern Present (04/13/2023)   [employer] of Occupational Health - Occupational Stress Questionnaire    Feeling of Stress : To some extent  Social Connections:  Moderately Integrated (04/13/2023)   Social Connection and Isolation Panel [NHANES]    Frequency of Communication with Friends and Family: More than three times a week    Frequency of Social Gatherings with Friends and Family: Once a week    Attends  Religious Services: More than 4 times per year    Active Member of Clubs or Organizations: No    Attends Banker Meetings: Never    Marital Status: Married    Tobacco Counseling Counseling given: Not Answered   Clinical Intake:  Pre-visit preparation completed: Yes  Pain : No/denies pain     BMI - recorded: 21.35 Nutritional Status: BMI of 19-24  Normal Nutritional Risks: None Diabetes: No  How often do you need to have someone help you when you read instructions, pamphlets, or other written materials from your doctor or pharmacy?: 1 - Never  Diabetic?no  Interpreter Needed?: No  Information entered by :: Lanier Ensign, LPN   Activities of Daily Living    04/13/2023   10:14 AM 09/05/2022    7:58 AM  In your present state of health, do you have any difficulty performing the following activities:  Hearing? 0 0  Vision? 0 0  Difficulty concentrating or making decisions? 0 0  Walking or climbing stairs? 0 0  Dressing or bathing? 0 0  Doing errands, shopping? 0 0  Preparing Food and eating ? N N  Using the Toilet? N N  In the past six months, have you accidently leaked urine? N N  Do you have problems with loss of bowel control? N N  Managing your Medications? N N  Managing your Finances? N N  Housekeeping or managing your Housekeeping? N N    Patient Care Team: Shelva Majestic, MD as PCP - General (Family Medicine) Jodelle Red, MD as PCP - Cardiology (Cardiology) Barnett Abu, MD as Consulting Physician (Neurosurgery) Jerene Bears, MD as Consulting Physician (Gynecology) Elmon Else, MD as Consulting Physician (Dermatology)  Indicate any recent Medical Services you may have received  from other than [facility] providers in the past year (date may be approximate).     Assessment:   This is a routine wellness examination for Cheyenne Myers.  Hearing/Vision screen Hearing Screening - Comments:: Pt denies any hearing issues  Vision Screening - Comments:: Pt follows up with dr Antony Contras  for annual eye exams   Dietary issues and exercise activities discussed: Current Exercise Habits: Home exercise routine, Type of exercise: walking (pt has not been in past 2 weeks due to knee injury), Time (Minutes): 30, Frequency (Times/Week): 7, Weekly Exercise (Minutes/Week): 210   Goals Addressed             This Visit's Progress    Patient Stated       Stay  healthy        Depression Screen    04/13/2023   10:12 AM 02/19/2023    8:22 AM 09/05/2022    7:56 AM 02/21/2022   11:28 AM 07/16/2021    8:04 AM 07/06/2020    9:50 AM 05/27/2019    3:12 PM  PHQ 2/9 Scores  PHQ - 2 Score 0 0 0 0 0 0 0  PHQ- 9 Score 0 0    1     Fall Risk    04/13/2023   10:14 AM 02/19/2023    8:22 AM 09/05/2022    7:58 AM 07/18/2022    7:55 AM 07/16/2021    8:04 AM  Fall Risk   Falls in the past year? 0 0 0 0 0  Number falls in past yr: 0 0 0 0 0  Injury with Fall? 0 0 0 0 0  Risk for fall due to : Impaired vision No Fall Risks No Fall  Risks;Impaired vision No Fall Risks No Fall Risks  Follow up Falls prevention discussed Falls evaluation completed Falls prevention discussed Falls evaluation completed Falls evaluation completed    FALL RISK PREVENTION PERTAINING TO THE HOME:  Any stairs in or around the home? Yes  If so, are there any without handrails? No  Home free of loose throw rugs in walkways, pet beds, electrical cords, etc? Yes  Adequate lighting in your home to reduce risk of falls? Yes   ASSISTIVE DEVICES UTILIZED TO PREVENT FALLS:  Life alert? No  Use of a cane, walker or w/c? No  Grab bars in the bathroom? No  Shower chair or bench in shower? No  Elevated toilet seat or a handicapped  toilet? No   TIMED UP AND GO:  Was the test performed? No  Cognitive Function:        04/13/2023   10:15 AM 09/05/2022    8:00 AM  6CIT Screen  What Year? 0 points 0 points  What month? 0 points 0 points  What time? 0 points 0 points  Count back from 20 0 points 0 points  Months in reverse 0 points 0 points  Repeat phrase 0 points 0 points  Total Score 0 points 0 points    Immunizations Immunization History  Administered Date(s) Administered   Influenza Split 10/14/2012   Influenza Whole 10/26/2009   Influenza,inj,Quad PF,6+ Mos 08/29/2013, 09/29/2017, 09/28/2018   Influenza-Unspecified 09/21/2014, 09/05/2015, 09/02/2016   Td 11/21/2006   Tdap 10/23/2015   Zoster Recombinat (Shingrix) 04/21/2018, 06/30/2018   Zoster, Live 09/22/2016    TDAP status: Up to date  Flu Vaccine status: Declined, Education has been provided regarding the importance of this vaccine but patient still declined. Advised may receive this vaccine at local pharmacy or Health Dept. Aware to provide a copy of the vaccination record if obtained from local pharmacy or Health Dept. Verbalized acceptance and understanding.  Pneumococcal vaccine status: Declined,  Education has been provided regarding the importance of this vaccine but patient still declined. Advised may receive this vaccine at local pharmacy or Health Dept. Aware to provide a copy of the vaccination record if obtained from local pharmacy or Health Dept. Verbalized acceptance and understanding.   Covid-19 vaccine status: Declined, Education has been provided regarding the importance of this vaccine but patient still declined. Advised may receive this vaccine at local pharmacy or Health Dept.or vaccine clinic. Aware to provide a copy of the vaccination record if obtained from local pharmacy or Health Dept. Verbalized acceptance and understanding.  Qualifies for Shingles Vaccine? Yes   Zostavax completed Yes   Shingrix Completed?:  Yes  Screening Tests Health Maintenance  Topic Date Due   INFLUENZA VACCINE  07/23/2023   MAMMOGRAM  10/24/2023   Medicare Annual Wellness (AWV)  04/12/2024   DEXA SCAN  01/06/2025   DTaP/Tdap/Td (3 - Td or Tdap) 10/22/2025   COLONOSCOPY (Pts 45-80yrs Insurance coverage will need to be confirmed)  09/03/2026   Hepatitis C Screening  Completed   Zoster Vaccines- Shingrix  Completed   HPV VACCINES  Aged Out   Pneumonia Vaccine 94+ Years old  Discontinued   COVID-19 Vaccine  Discontinued    Health Maintenance  There are no preventive care reminders to display for this patient.  Colorectal cancer screening: Type of screening: Colonoscopy. Completed 09/03/21. Repeat every 5 years  Mammogram status: Completed 10/23/22. Repeat every year  Bone Density status: Completed 01/06/22. Results reflect: Bone density results: OSTEOPENIA. Repeat every 3 years.  Additional Screening:  Hepatitis C Screening: Completed 11/29/18  Vision Screening: Recommended annual ophthalmology exams for early detection of glaucoma and other disorders of the eye. Is the patient up to date with their annual eye exam?  Yes  Who is the provider or what is the name of the office in which the patient attends annual eye exams? Dr Randon Goldsmith  If pt is not established with a provider, would they like to be referred to a provider to establish care? No .   Dental Screening: Recommended annual dental exams for proper oral hygiene  Community Resource Referral / Chronic Care Management: CRR required this visit?  No   CCM required this visit?  No      Plan:     I have personally reviewed and noted the following in the patient's chart:   Medical and social history Use of alcohol, tobacco or illicit drugs  Current medications and supplements including opioid prescriptions. Patient is not currently taking opioid prescriptions. Functional ability and status Nutritional status Physical activity Advanced directives List  of other physicians Hospitalizations, surgeries, and ER visits in previous 12 months Vitals Screenings to include cognitive, depression, and falls Referrals and appointments  In addition, I have reviewed and discussed with patient certain preventive protocols, quality metrics, and best practice recommendations. A written personalized care plan for preventive services as well as general preventive health recommendations were provided to patient.     Marzella Schlein, LPN   08/19/5620   Nurse Notes: none

## 2023-04-13 NOTE — Patient Instructions (Signed)
Cheyenne Myers , Thank you for taking time to come for your Medicare Wellness Visit. I appreciate your ongoing commitment to your health goals. Please review the following plan we discussed and let me know if I can assist you in the future.   These are the goals we discussed:  Goals      Patient Stated     Maintain health      Patient Stated     Stay  healthy         This is a list of the screening recommended for you and due dates:  Health Maintenance  Topic Date Due   Flu Shot  07/23/2023   Mammogram  10/24/2023   Medicare Annual Wellness Visit  04/12/2024   DEXA scan (bone density measurement)  01/06/2025   DTaP/Tdap/Td vaccine (3 - Td or Tdap) 10/22/2025   Colon Cancer Screening  09/03/2026   Hepatitis C Screening: USPSTF Recommendation to screen - Ages 18-79 yo.  Completed   Zoster (Shingles) Vaccine  Completed   HPV Vaccine  Aged Out   Pneumonia Vaccine  Discontinued   COVID-19 Vaccine  Discontinued    Advanced directives: copies in chart  Conditions/risks identified: stay healthy   Next appointment: Follow up in one year for your annual wellness visit    Preventive Care 65 Years and Older, Female Preventive care refers to lifestyle choices and visits with your health care provider that can promote health and wellness. What does preventive care include? A yearly physical exam. This is also called an annual well check. Dental exams once or twice a year. Routine eye exams. Ask your health care provider how often you should have your eyes checked. Personal lifestyle choices, including: Daily care of your teeth and gums. Regular physical activity. Eating a healthy diet. Avoiding tobacco and drug use. Limiting alcohol use. Practicing safe sex. Taking low-dose aspirin every day. Taking vitamin and mineral supplements as recommended by your health care provider. What happens during an annual well check? The services and screenings done by your health care provider during  your annual well check will depend on your age, overall health, lifestyle risk factors, and family history of disease. Counseling  Your health care provider may ask you questions about your: Alcohol use. Tobacco use. Drug use. Emotional well-being. Home and relationship well-being. Sexual activity. Eating habits. History of falls. Memory and ability to understand (cognition). Work and work Astronomer. Reproductive health. Screening  You may have the following tests or measurements: Height, weight, and BMI. Blood pressure. Lipid and cholesterol levels. These may be checked every 5 years, or more frequently if you are over 18 years old. Skin check. Lung cancer screening. You may have this screening every year starting at age 89 if you have a 30-pack-year history of smoking and currently smoke or have quit within the past 15 years. Fecal occult blood test (FOBT) of the stool. You may have this test every year starting at age 85. Flexible sigmoidoscopy or colonoscopy. You may have a sigmoidoscopy every 5 years or a colonoscopy every 10 years starting at age 54. Hepatitis C blood test. Hepatitis B blood test. Sexually transmitted disease (STD) testing. Diabetes screening. This is done by checking your blood sugar (glucose) after you have not eaten for a while (fasting). You may have this done every 1-3 years. Bone density scan. This is done to screen for osteoporosis. You may have this done starting at age 75. Mammogram. This may be done every 1-2 years. Talk to  your health care provider about how often you should have regular mammograms. Talk with your health care provider about your test results, treatment options, and if necessary, the need for more tests. Vaccines  Your health care provider may recommend certain vaccines, such as: Influenza vaccine. This is recommended every year. Tetanus, diphtheria, and acellular pertussis (Tdap, Td) vaccine. You may need a Td booster every 10  years. Zoster vaccine. You may need this after age 20. Pneumococcal 13-valent conjugate (PCV13) vaccine. One dose is recommended after age 12. Pneumococcal polysaccharide (PPSV23) vaccine. One dose is recommended after age 51. Talk to your health care provider about which screenings and vaccines you need and how often you need them. This information is not intended to replace advice given to you by your health care provider. Make sure you discuss any questions you have with your health care provider. Document Released: 01/04/2016 Document Revised: 08/27/2016 Document Reviewed: 10/09/2015 Elsevier Interactive Patient Education  2017 [employer].  Fall Prevention in the Home Falls can cause injuries. They can happen to people of all ages. There are many things you can do to make your home safe and to help prevent falls. What can I do on the outside of my home? Regularly fix the edges of walkways and driveways and fix any cracks. Remove anything that might make you trip as you walk through a door, such as a raised step or threshold. Trim any bushes or trees on the path to your home. Use bright outdoor lighting. Clear any walking paths of anything that might make someone trip, such as rocks or tools. Regularly check to see if handrails are loose or broken. Make sure that both sides of any steps have handrails. Any raised decks and porches should have guardrails on the edges. Have any leaves, snow, or ice cleared regularly. Use sand or salt on walking paths during winter. Clean up any spills in your garage right away. This includes oil or grease spills. What can I do in the bathroom? Use night lights. Install grab bars by the toilet and in the tub and shower. Do not use towel bars as grab bars. Use non-skid mats or decals in the tub or shower. If you need to sit down in the shower, use a plastic, non-slip stool. Keep the floor dry. Clean up any water that spills on the floor as soon as it  happens. Remove soap buildup in the tub or shower regularly. Attach bath mats securely with double-sided non-slip rug tape. Do not have throw rugs and other things on the floor that can make you trip. What can I do in the bedroom? Use night lights. Make sure that you have a light by your bed that is easy to reach. Do not use any sheets or blankets that are too big for your bed. They should not hang down onto the floor. Have a firm chair that has side arms. You can use this for support while you get dressed. Do not have throw rugs and other things on the floor that can make you trip. What can I do in the kitchen? Clean up any spills right away. Avoid walking on wet floors. Keep items that you use a lot in easy-to-reach places. If you need to reach something above you, use a strong step stool that has a grab bar. Keep electrical cords out of the way. Do not use floor polish or wax that makes floors slippery. If you must use wax, use non-skid floor wax. Do not  have throw rugs and other things on the floor that can make you trip. What can I do with my stairs? Do not leave any items on the stairs. Make sure that there are handrails on both sides of the stairs and use them. Fix handrails that are broken or loose. Make sure that handrails are as long as the stairways. Check any carpeting to make sure that it is firmly attached to the stairs. Fix any carpet that is loose or worn. Avoid having throw rugs at the top or bottom of the stairs. If you do have throw rugs, attach them to the floor with carpet tape. Make sure that you have a light switch at the top of the stairs and the bottom of the stairs. If you do not have them, ask someone to add them for you. What else can I do to help prevent falls? Wear shoes that: Do not have high heels. Have rubber bottoms. Are comfortable and fit you well. Are closed at the toe. Do not wear sandals. If you use a stepladder: Make sure that it is fully opened.  Do not climb a closed stepladder. Make sure that both sides of the stepladder are locked into place. Ask someone to hold it for you, if possible. Clearly mark and make sure that you can see: Any grab bars or handrails. First and last steps. Where the edge of each step is. Use tools that help you move around (mobility aids) if they are needed. These include: Canes. Walkers. Scooters. Crutches. Turn on the lights when you go into a dark area. Replace any light bulbs as soon as they burn out. Set up your furniture so you have a clear path. Avoid moving your furniture around. If any of your floors are uneven, fix them. If there are any pets around you, be aware of where they are. Review your medicines with your doctor. Some medicines can make you feel dizzy. This can increase your chance of falling. Ask your doctor what other things that you can do to help prevent falls. This information is not intended to replace advice given to you by your health care provider. Make sure you discuss any questions you have with your health care provider. Document Released: 10/04/2009 Document Revised: 05/15/2016 Document Reviewed: 01/12/2015 Elsevier Interactive Patient Education  2017 [employer].

## 2023-04-15 DIAGNOSIS — M2241 Chondromalacia patellae, right knee: Secondary | ICD-10-CM | POA: Diagnosis not present

## 2023-05-20 DIAGNOSIS — H401131 Primary open-angle glaucoma, bilateral, mild stage: Secondary | ICD-10-CM | POA: Diagnosis not present

## 2023-05-20 DIAGNOSIS — Z961 Presence of intraocular lens: Secondary | ICD-10-CM | POA: Diagnosis not present

## 2023-05-29 ENCOUNTER — Encounter: Payer: Self-pay | Admitting: Family Medicine

## 2023-05-29 ENCOUNTER — Ambulatory Visit (INDEPENDENT_AMBULATORY_CARE_PROVIDER_SITE_OTHER): Payer: PPO | Admitting: Family Medicine

## 2023-05-29 VITALS — BP 120/80 | HR 64 | Temp 97.7°F | Ht 61.0 in | Wt 114.8 lb

## 2023-05-29 DIAGNOSIS — I872 Venous insufficiency (chronic) (peripheral): Secondary | ICD-10-CM | POA: Diagnosis not present

## 2023-05-29 NOTE — Progress Notes (Signed)
Phone [phone] In person visit   Subjective:   Cheyenne Myers is a 67 y.o. year old very pleasant female patient who presents for/with See problem oriented charting Chief Complaint  Patient presents with   feet issues    Pt c/o feet turning purple after she sits for a while.   Past Medical History-  Patient Active Problem List   Diagnosis Date Noted   Osteopenia of left femoral neck 10/29/2019    Priority: Medium    Glaucoma 07/28/2018    Priority: Medium    Stenosis of cervical spine 07/18/2014    Priority: Medium    Hyperlipidemia 08/05/2007    Priority: Medium    Vitamin D deficiency 07/18/2022    Priority: Low   Hx of adenomatous polyp of colon 02/13/2016    Priority: Low   H/O cold sores 07/18/2014    Priority: Low   Cervical spondylosis without myelopathy 01/25/2014    Priority: Low    Medications- reviewed and updated Current Outpatient Medications  Medication Sig Dispense Refill   acyclovir (ZOVIRAX) 200 MG capsule Take 1 capsule (200 mg total) by mouth daily. 90 capsule 3   Cholecalciferol (VITAMIN D3 PO) Take 5,000 Units by mouth.     Cyanocobalamin (VITAMIN B 12 PO) Take by mouth.     Glucosamine 750 MG TABS Take 2 tablets by mouth daily.     KRILL OIL 1000 MG CAPS Take 2 capsules (2,000 mg total) by mouth 2 (two) times daily.     latanoprost (XALATAN) 0.005 % ophthalmic solution   6   Menaquinone-7 (VITAMIN K2 PO) Take by mouth every other day.      NALTREXONE HCL PO Take 3 mg by mouth. For immune support- through integrative health low dose     NONFORMULARY OR COMPOUNDED ITEM Biest 50/50 2 clocks 1/2 cc day 1 each 3   Omega-3 1000 MG CAPS Take by mouth.     Pregnenolone Micronized (PREGNENOLONE PO) Take by mouth.     Probiotic Product (PROBIOTIC DAILY PO) Take by mouth daily.     PROGESTERONE MICRONIZED PO Take 100 mg by mouth.     Pyridoxine HCl (B-6) 50 MG TABS Take by mouth.     rosuvastatin (CRESTOR) 5 MG tablet Take 1 tablet (5 mg total) by mouth  2 (two) times a week. 26 tablet 3   Ubiquinol 100 MG CAPS Take by mouth.     UNABLE TO FIND 500 mg. Curcumin     No current facility-administered medications for this visit.     Objective:  BP 120/80   Pulse 64   Temp 97.7 F (36.5 C)   Ht 5\' 1"  (1.549 m)   Wt 114 lb 12.8 oz (52.1 kg)   LMP 10/17/2009 (Exact Date)   SpO2 97%   BMI 21.69 kg/m  Gen: NAD, resting comfortably CV: RRR no murmurs rubs or gallops Lungs: CTAB no crackles, wheeze, rhonchi Abdomen: soft/nontender/nondistended/normal bowel sounds. No rebound or guarding.  Ext: minimal edema. Has mild purplish discoloration of toes and mild coolness to feet when hanging from table despite 2+ DP and PT pulses bilaterally.  Skin: warm, dry    Assessment and Plan   #social update- going to [place] in September! ship out and fly back. We will see her at annual visit before that time though  # Foot discoloration- mild purplish discoloration S:patient has noted feet turning purple color when sits for a while. First noted this sometime after the knee pain issues  and having to be more sedentary- did not note last summer. When started not wearing closed toed shoes in recent month or so noted purplish discoloration in both feet- slightly worse on the right. Feet feel cold- but not in the cold. Going on a long flight of the [place] and just wants to make sure she's ok before travel.  -otherwise feeling well other than mild lingering cough after feeling poorly for 10 days after coming back from [place] but feels improving and was about 3 weeks ago that this started. No fever or chills at this point.  A/P:  Mild venous insufficiency (improves with elevation, worse with dependent position, no pain reported)  -can trial compression stockings up to 20-30 mmhg  - remain active- if you do have to be sedentary or on long plane flights or car rides definitely wear compression then -excellent 2+ pulses so I do not think this is  arterial -since not particularly noted with cold- doubt Raynaud's syndrome  Lungs clear on exam today- discussed if lasts more than another 3 weeks to consider x-ray or certainly see [place] back if worsening  # FYI on Knee pain- she updates me that she has seen orthopedics- has chondromalacia patella and later found to have bone spur in right knee. Has had to be less active as a result of this and accommodations that orthopedics requested like avoiding deep squats that had originally been recommended by PT   Recommended follow up: Return for next already scheduled visit or sooner if needed. Future Appointments  Date Time Provider Department Center  07/22/2023  8:00 AM Shelva Majestic, MD [facility]  02/25/2024  1:55 PM Jerene Bears, MD [facility]  04/18/2024 11:30 AM [facility] ANNUAL WELLNESS VISIT 1 [facility]   Lab/Order associations:   ICD-10-CM   1. Venous insufficiency  I87.2      Time Spent: 22 minutes of total time (8:40 AM- 9:02 AM) was spent on the date of the encounter performing the following actions: obtaining history, performing a medically necessary exam, counseling on the mechanism of venous insufficiency and treatment plan and ways she can help prevent progression, and documenting in our EHR.   Return precautions advised.  Tana Conch, MD

## 2023-05-29 NOTE — Patient Instructions (Signed)
Mild venous insufficiency -can trial compression stockings up to 20-30 mmhg  - remain active- if you do have to be sedentary or on long plane flights or car rides definitely wear compression then -excellent 2+ pulses so I do not think this is arterial -since not particularly noted with cold- doubt Raynaud's syndrome  Recommended follow up: No follow-ups on file.

## 2023-07-08 LAB — AMB RESULTS CONSOLE CBG: Glucose: 114

## 2023-07-08 LAB — HEMOGLOBIN A1C: Hemoglobin A1C: 5.2

## 2023-07-08 NOTE — Progress Notes (Signed)
Pt has PCP. No changes in SDOH nsince June 6th

## 2023-07-20 DIAGNOSIS — N959 Unspecified menopausal and perimenopausal disorder: Secondary | ICD-10-CM | POA: Diagnosis not present

## 2023-07-20 DIAGNOSIS — L659 Nonscarring hair loss, unspecified: Secondary | ICD-10-CM | POA: Diagnosis not present

## 2023-07-20 NOTE — Progress Notes (Signed)
Pt attended a screening event on 07/08/23, where screening results A1C and Glucose wnl. At the event, Pt confirmed Dr. Tana Conch as PCP and no SDOH insecurities was indicated. Per chart review, Pt was seen by PCP on 05/29/23 and has upcoming appointment on 07/22/23. No additional health equity team support indicated at this time.

## 2023-07-22 ENCOUNTER — Ambulatory Visit (INDEPENDENT_AMBULATORY_CARE_PROVIDER_SITE_OTHER): Payer: PPO | Admitting: Family Medicine

## 2023-07-22 ENCOUNTER — Encounter: Payer: Self-pay | Admitting: Family Medicine

## 2023-07-22 VITALS — BP 100/70 | HR 71 | Temp 97.3°F | Ht 61.0 in | Wt 113.4 lb

## 2023-07-22 DIAGNOSIS — Z0000 Encounter for general adult medical examination without abnormal findings: Secondary | ICD-10-CM | POA: Diagnosis not present

## 2023-07-22 DIAGNOSIS — M85852 Other specified disorders of bone density and structure, left thigh: Secondary | ICD-10-CM

## 2023-07-22 DIAGNOSIS — Z79899 Other long term (current) drug therapy: Secondary | ICD-10-CM

## 2023-07-22 DIAGNOSIS — E559 Vitamin D deficiency, unspecified: Secondary | ICD-10-CM | POA: Diagnosis not present

## 2023-07-22 DIAGNOSIS — E785 Hyperlipidemia, unspecified: Secondary | ICD-10-CM | POA: Diagnosis not present

## 2023-07-22 LAB — LIPID PANEL
Cholesterol: 177 mg/dL (ref 0–200)
HDL: 77.7 mg/dL (ref >39.00)
LDL Cholesterol: 85 mg/dL (ref 0–99)
NonHDL: 99.66
Total CHOL/HDL Ratio: 2
Triglycerides: 71 mg/dL (ref 0.0–149.0)
VLDL: 14.2 mg/dL (ref 0.0–40.0)

## 2023-07-22 LAB — CBC WITH DIFFERENTIAL/PLATELET
Basophils Absolute: 0.1 10*3/uL (ref 0.0–0.1)
Basophils Relative: 2.7 % (ref 0.0–3.0)
Eosinophils Absolute: 0.1 10*3/uL (ref 0.0–0.7)
Eosinophils Relative: 1.1 % (ref 0.0–5.0)
HCT: 43.3 % (ref 36.0–46.0)
Hemoglobin: 14.1 g/dL (ref 12.0–15.0)
Lymphocytes Relative: 25.6 % (ref 12.0–46.0)
Lymphs Abs: 1.4 10*3/uL (ref 0.7–4.0)
MCHC: 32.6 g/dL (ref 30.0–36.0)
MCV: 95.9 fl (ref 78.0–100.0)
Monocytes Absolute: 0.4 10*3/uL (ref 0.1–1.0)
Monocytes Relative: 8.1 % (ref 3.0–12.0)
Neutro Abs: 3.3 10*3/uL (ref 1.4–7.7)
Neutrophils Relative %: 62.5 % (ref 43.0–77.0)
Platelets: 235 10*3/uL (ref 150.0–400.0)
RBC: 4.51 Mil/uL (ref 3.87–5.11)
RDW: 13.3 % (ref 11.5–15.5)
WBC: 5.4 10*3/uL (ref 4.0–10.5)

## 2023-07-22 LAB — COMPREHENSIVE METABOLIC PANEL
ALT: 15 U/L (ref 0–35)
AST: 21 U/L (ref 0–37)
Albumin: 4.3 g/dL (ref 3.5–5.2)
Alkaline Phosphatase: 20 U/L — ABNORMAL LOW (ref 39–117)
BUN: 15 mg/dL (ref 6–23)
CO2: 26 mEq/L (ref 19–32)
Calcium: 9.2 mg/dL (ref 8.4–10.5)
Chloride: 106 mEq/L (ref 96–112)
Creatinine, Ser: 0.78 mg/dL (ref 0.40–1.20)
GFR: 78.9 mL/min (ref >60.00)
Glucose, Bld: 84 mg/dL (ref 70–99)
Potassium: 4 mEq/L (ref 3.5–5.1)
Sodium: 140 mEq/L (ref 135–145)
Total Bilirubin: 1.3 mg/dL — ABNORMAL HIGH (ref 0.2–1.2)
Total Protein: 7 g/dL (ref 6.0–8.3)

## 2023-07-22 LAB — VITAMIN D 25 HYDROXY (VIT D DEFICIENCY, FRACTURES): VITD: 76.53 ng/mL (ref 30.00–100.00)

## 2023-07-22 LAB — C-REACTIVE PROTEIN: CRP: <1 mg/dL (ref 0.5–20.0)

## 2023-07-22 MED ORDER — SCOPOLAMINE 1 MG/3DAYS TD PT72: 1.0000 | MEDICATED_PATCH | TRANSDERMAL | 0 refills | Status: DC

## 2023-07-22 NOTE — Patient Instructions (Addendum)
I actually think a trial of Flonase may be of some benefit and see if it helps her rub the eyes a little less perhaps up to 3 weeks- if not effective would stop  For the one patch on the hand that is more circular- I am not overly confident but could try lamisil topical for 2 weeks- otherwise could ask dermatology again - I am not clear  Please stop by lab before you go If you have mychart- we will send your results within 3 business days of [place] receiving them.  If you do not have mychart- we will call you about results within 5 business days of [place] receiving them.  *please also note that you will see labs on mychart as soon as they post. I will later go in and write notes on them- will say "notes from Dr. Durene Cal"   Recommended follow up: Return in about 1 year (around 07/21/2024) for physical or sooner if needed.Schedule b4 you leave.

## 2023-07-22 NOTE — Progress Notes (Signed)
Phone [phone]   Subjective:  Patient presents today for their annual physical. Chief complaint-noted.   See problem oriented charting- ROS- full  review of systems was completed and negative Per full ROS sheet completed by patient  The following were reviewed and entered/updated in epic: Past Medical History:  Diagnosis Date   Abnormal Pap smear 06/2004   ASCUS, + HPV, CIN II LEEP   Arthritis    Cataract    Cervical radicular pain    managed by [facility]   Epstein Barr infection    Glaucoma    Heart murmur    Hx of adenomatous polyp of colon 02/13/2016   Hyperlipemia    MITRAL VALVE PROLAPSE 08/05/2007   Qualifier: Diagnosis of  By: Briscoe Burns CMA, Bárbara.Apley     Patient Active Problem List   Diagnosis Date Noted   Osteopenia of left femoral neck 10/29/2019    Priority: Medium    Glaucoma 07/28/2018    Priority: Medium    Stenosis of cervical spine 07/18/2014    Priority: Medium    Hyperlipidemia 08/05/2007    Priority: Medium    Vitamin D deficiency 07/18/2022    Priority: Low   Hx of adenomatous polyp of colon 02/13/2016    Priority: Low   H/O cold sores 07/18/2014    Priority: Low   Cervical spondylosis without myelopathy 01/25/2014    Priority: Low   Past Surgical History:  Procedure Laterality Date   CARPAL TUNNEL RELEASE Right 08/2011   CATARACT EXTRACTION Bilateral    CERVICAL BIOPSY  W/ LOOP ELECTRODE EXCISION  06/2004   CIN II   COLONOSCOPY     COLPOSCOPY     WISDOM TOOTH EXTRACTION  age 84    Family History  Problem Relation Age of Onset   Heart disease Mother        age 13   Diabetes Mother    Alzheimer's disease Mother    Heart attack Father        age 68 died- but was smoker.    Stroke Sister        tia and pacer   Atrial fibrillation Sister    Dementia Sister        early   Transient ischemic attack Sister    Dementia Sister        severe emotional disturbance   Colon cancer Sister        47   Diabetes Maternal Grandmother    Breast  cancer Maternal Grandmother    Diabetes Maternal Grandfather    Colon polyps Neg Hx    Esophageal cancer Neg Hx    Rectal cancer Neg Hx    Stomach cancer Neg Hx     Medications- reviewed and updated Current Outpatient Medications  Medication Sig Dispense Refill   acyclovir (ZOVIRAX) 200 MG capsule Take 1 capsule (200 mg total) by mouth daily. 90 capsule 3   Cholecalciferol (VITAMIN D3 PO) Take 5,000 Units by mouth.     Cyanocobalamin (VITAMIN B 12 PO) Take by mouth.     Glucosamine 750 MG TABS Take 2 tablets by mouth daily.     KRILL OIL 1000 MG CAPS Take 2 capsules (2,000 mg total) by mouth 2 (two) times daily.     latanoprost (XALATAN) 0.005 % ophthalmic solution   6   Menaquinone-7 (VITAMIN K2 PO) Take by mouth every other day.      NALTREXONE HCL PO Take 3 mg by mouth. For immune support- through integrative health low dose  NONFORMULARY OR COMPOUNDED ITEM Biest 50/50 2 clocks 1/2 cc day 1 each 3   Omega-3 1000 MG CAPS Take by mouth.     Pregnenolone Micronized (PREGNENOLONE PO) Take by mouth.     Probiotic Product (PROBIOTIC DAILY PO) Take by mouth daily.     PROGESTERONE MICRONIZED PO Take 100 mg by mouth.     Pyridoxine HCl (B-6) 50 MG TABS Take by mouth.     rosuvastatin (CRESTOR) 5 MG tablet Take 1 tablet (5 mg total) by mouth 2 (two) times a week. 26 tablet 3   scopolamine (TRANSDERM-SCOP) 1 MG/3DAYS Place 1 patch (1.5 mg total) onto the skin every 3 (three) days. For cruise 4 patch 0   Ubiquinol 100 MG CAPS Take by mouth.     UNABLE TO FIND 500 mg. Curcumin     No current facility-administered medications for this visit.    Allergies-reviewed and updated No Known Allergies  Social History   Social History Narrative   Married. 2 children. 2 grandkids 5 and 1.5 in 05/2017.       Retired Animal nutritionist at [employer] Situation: lives with husband      Lifestyle: regular walking, healthy diet   Hobbies: travel, camping, hike, bike, church- Christian faith    Objective  Objective:  BP 100/70   Pulse 71   Temp (!) 97.3 F (36.3 C)   Ht 5\' 1"  (1.549 m)   Wt 113 lb 6.4 oz (51.4 kg)   LMP 10/17/2009 (Exact Date)   SpO2 99%   BMI 21.43 kg/m  Gen: NAD, resting comfortably HEENT: Mucous membranes are moist. Oropharynx normal Neck: no thyromegaly CV: RRR no murmurs rubs or gallops Lungs: CTAB no crackles, wheeze, rhonchi Abdomen: soft/nontender/nondistended/normal bowel sounds. No rebound or guarding.  Ext: no edema Skin: warm, dry Neuro: grossly normal, moves all extremities, PERRLA   Assessment and Plan   67 y.o. female presenting for annual physical.  Health Maintenance counseling: 1. Anticipatory guidance: Patient counseled regarding regular dental exams -q6 months, eye exams - regular checks with glaucoma,  avoiding smoking and second hand smoke , limiting alcohol to 1 beverage per day-doesn't drink , no illicit drugs .   2. Risk factor reduction:  Advised patient of need for regular exercise and diet rich and fruits and vegetables to reduce risk of heart attack and stroke.  Exercise- walking regularly.  Diet/weight management--mild weight gain encouraging! Still eats very health  Wt Readings from Last 3 Encounters:  07/22/23 113 lb 6.4 oz (51.4 kg)  05/29/23 114 lb 12.8 oz (52.1 kg)  04/13/23 113 lb (51.3 kg)  3. Immunizations/screenings/ancillary studies- holding on prevnar 20 and covid, will let us know if gets fall flu shot- she's holding off Immunization History  Administered Date(s) Administered   Influenza Split 10/14/2012   Influenza Whole 10/26/2009   Influenza,inj,Quad PF,6+ Mos 08/29/2013, 09/29/2017, 09/28/2018   Influenza-Unspecified 09/21/2014, 09/05/2015, 09/02/2016   Td 11/21/2006   Tdap 10/23/2015   Zoster Recombinant(Shingrix) 04/21/2018, 06/30/2018   Zoster, Live 09/22/2016  4. Cervical cancer screening- followed by Dr. Vincente Poli.  Hardie Shackleton- past age based screening recommendations  5. Breast cancer  screening-  breast exam with GYN and mammogram 10/23/2022  6. Colon cancer screening - 09/03/2021 with 5-year repeat 7. Skin cancer screening-follows with Dr. Emily Filbert and will see in November . . advised regular sunscreen use. Denies worrisome, changing, or new skin lesions- other than rash as below 8. Birth control/STD check- postmenopausal/monogamous 9. Osteoporosis  screening at 20- October 24, 2019 with osteopenia-great job with weightbearing exercise and takes vitamin D-ohad repeat in 01/06/22 essentially stable - repeat 2025  10. Smoking associated screening - Never smoker  Status of chronic or acute concerns   #social update- upcoming cruise- will fill scopolamine- [place]  #hyperlipidemia- sees cardiology as well #CT cardiac score of 2 which is 53rd percentile July 25, 2022 S: Medication:rosuvastatin 5 mg twice a week, krill oil, ubiquinol as well Lab Results  Component Value Date   CHOL 175 12/11/2022   HDL 75.80 12/11/2022   LDLCALC 86 12/11/2022   LDLDIRECT 134.3 03/01/2012   TRIG 65.0 12/11/2022   CHOLHDL 2 12/11/2022  A/P: lipids hopefully stable or improved- ideally LDL under 70 but she wants to be cautious about statins so we will continue low dose  #Vitamin D deficiency S: Medication: 5000 units daily, also takes k2 Last vitamin D Lab Results  Component Value Date   VD25OH 68.63 07/18/2022  A/P: hopefully stable- update vitamin D today. Continue current meds for now    #venous insufficiency- improves with elevation, worse with dependent position, no pain -have advised compression stockings - has them for trip but doesn't feel needs all the time  #Murmur-very mild mitral valve regurgitation and mitral valve prolapse on echo 2022-not clinically significant-I do not always hear a murmur but has been heard by Dr. Lovell Sheehan and her integrative physician. No chest pain shortness of breath. No palpitations - did not hear today  #hormone replacement therapy-  through integrative health -on this through gynecology- asks for dhea through [place]  #Rash- has noted some redness around the eyes - gets a lot of tearing in the mornings with burning and itching as well. Has tried hydrocortisone 2.5% on limited basis with some relief. Has seen dermatology and optho without clear answers. Has also had patch testing and no clear trigger found -similar rash on either hand or wrist- there is one patch that is circular and a few smaller non red areas on hand that appear to be flat warts -dermatology treatments/creams work but do not resolve issue- wants to find root cause - I do wonder if could have allergic element- has tried patady and other eye drops- I actually think a trial of Flonase may be of some benefit and see if it helps her rub the eyes a little less perhaps up to 3 weeks- if not effective would stop -check crp with rash but also hyperlipidemia- she is ok not doing high sensitivity  Recommended follow up: Return in about 1 year (around 07/21/2024) for physical or sooner if needed.Schedule b4 you leave. Future Appointments  Date Time Provider Department Center  11/13/2023  8:40 AM Jodelle Red, MD [facility]  02/25/2024  1:55 PM Jerene Bears, MD [facility]  04/18/2024 11:30 AM [facility] ANNUAL WELLNESS VISIT 1 [facility]   Lab/Order associations: fasting   ICD-10-CM   1. Preventative health care  Z00.00     2. Hyperlipidemia, unspecified hyperlipidemia type  E78.5 Comprehensive metabolic panel    CBC with Differential/Platelet    Lipid panel    Apolipoprotein B    C-reactive protein    3. Osteopenia of left femoral neck  M85.852     4. Vitamin D deficiency  E55.9 VITAMIN D 25 Hydroxy (Vit-D Deficiency, Fractures)    5. High risk medication use  Z79.899 DHEA-sulfate      Meds ordered this encounter  Medications   scopolamine (TRANSDERM-SCOP) 1 MG/3DAYS  Sig: Place 1 patch (1.5 mg total) onto the skin every 3 (three) days. For  cruise    Dispense:  4 patch    Refill:  0    Return precautions advised.  Tana Conch, MD

## 2023-07-22 NOTE — Addendum Note (Signed)
Addended by: Lorn Junes on: 07/22/2023 08:56 AM   Modules accepted: Orders

## 2023-08-06 DIAGNOSIS — L570 Actinic keratosis: Secondary | ICD-10-CM | POA: Diagnosis not present

## 2023-08-06 DIAGNOSIS — L821 Other seborrheic keratosis: Secondary | ICD-10-CM | POA: Diagnosis not present

## 2023-08-06 DIAGNOSIS — L814 Other melanin hyperpigmentation: Secondary | ICD-10-CM | POA: Diagnosis not present

## 2023-08-06 DIAGNOSIS — L2089 Other atopic dermatitis: Secondary | ICD-10-CM | POA: Diagnosis not present

## 2023-08-13 ENCOUNTER — Other Ambulatory Visit: Payer: Self-pay | Admitting: Medical Genetics

## 2023-08-13 DIAGNOSIS — Z006 Encounter for examination for normal comparison and control in clinical research program: Secondary | ICD-10-CM

## 2023-09-07 ENCOUNTER — Other Ambulatory Visit: Payer: Self-pay | Admitting: Family Medicine

## 2023-09-07 ENCOUNTER — Telehealth (HOSPITAL_BASED_OUTPATIENT_CLINIC_OR_DEPARTMENT_OTHER): Payer: Self-pay | Admitting: *Deleted

## 2023-09-07 DIAGNOSIS — N959 Unspecified menopausal and perimenopausal disorder: Secondary | ICD-10-CM | POA: Diagnosis not present

## 2023-09-07 NOTE — Telephone Encounter (Signed)
Pt reports pelvic pain on lower left side and some bloating. She says that it comes and goes and is a dull achy pain. There is not anything that makes it better or worse. Pt is going out of the country for 6 weeks and would like to get evaluated before she leaves. Pt provided with appt.

## 2023-09-07 NOTE — Telephone Encounter (Signed)
Patient call and left a message for the nurse to please called her she having lower back pain.

## 2023-09-08 ENCOUNTER — Ambulatory Visit (HOSPITAL_BASED_OUTPATIENT_CLINIC_OR_DEPARTMENT_OTHER): Payer: PPO | Admitting: Advanced Practice Midwife

## 2023-09-08 ENCOUNTER — Encounter (HOSPITAL_BASED_OUTPATIENT_CLINIC_OR_DEPARTMENT_OTHER): Payer: Self-pay | Admitting: Advanced Practice Midwife

## 2023-09-08 VITALS — BP 136/79 | HR 68 | Ht 61.0 in | Wt 117.0 lb

## 2023-09-08 DIAGNOSIS — R102 Pelvic and perineal pain: Secondary | ICD-10-CM | POA: Diagnosis not present

## 2023-09-08 DIAGNOSIS — R3915 Urgency of urination: Secondary | ICD-10-CM

## 2023-09-08 NOTE — Progress Notes (Signed)
   GYNECOLOGY PROGRESS NOTE  History:  67 y.o. G2P2002 presents to [facility] office today for problem gyn visit. She reports onset of LLQ pain 4 days ago that was dull and constant that gradually resolved.  There is now bloating in her mid lower abdomen. She denies any changes to her bowel or bladder habits. There is some urinary pressure and urgency that is unchanged in several years and vaginal dryness associated with menopause.  Symptoms are not resolved with HRT managed by integrative provider.  Medication did change 2 months ago to patch vs vaginal ring due to insurance.  She denies h/a, dizziness, shortness of breath, n/v, or fever/chills.    The following portions of the patient's history were reviewed and updated as appropriate: allergies, current medications, past family history, past medical history, past social history, past surgical history and problem list. Last pap smear on 02/21/2022 was normal, negative HRHPV.  Health Maintenance Due  Topic Date Due   INFLUENZA VACCINE  07/23/2023     Review of Systems:  Pertinent items are noted in HPI.   Objective:  Physical Exam Blood pressure 136/79, pulse 68, height 5\' 1"  (1.549 m), weight 117 lb (53.1 kg), last menstrual period 10/17/2009. VS reviewed, nursing note reviewed,  Constitutional: well developed, well nourished, no distress HEENT: normocephalic CV: normal rate Pulm/chest wall: normal effort Breast Exam: deferred Abdomen: soft Neuro: alert and oriented x 3 Skin: warm, dry Psych: affect normal Pelvic exam: Bimanual exam: Cervix 0/long/high, firm, anterior, neg CMT, uterus nontender, nonenlarged, adnexa without tenderness, enlargement, or mass  Assessment & Plan:  1. Acute pelvic pain, female --Pelvic exam wnl in office today but pt with new onset pelvic symptoms postmenopause, so will evaluate with outpatient [place]  - US PELVIC COMPLETE WITH TRANSVAGINAL; Future  2. Urinary urgency --Chronic, discussed options and  pt to consider urogyn referral later this year, after she returns from 6 weeks out of the country.    Return if symptoms worsen or fail to improve.   Sharen Counter, CNM 6:08 PM

## 2023-11-04 ENCOUNTER — Other Ambulatory Visit: Payer: Self-pay | Admitting: Family Medicine

## 2023-11-06 ENCOUNTER — Encounter (HOSPITAL_BASED_OUTPATIENT_CLINIC_OR_DEPARTMENT_OTHER): Payer: Self-pay

## 2023-11-06 DIAGNOSIS — Z5181 Encounter for therapeutic drug level monitoring: Secondary | ICD-10-CM

## 2023-11-06 DIAGNOSIS — E7800 Pure hypercholesterolemia, unspecified: Secondary | ICD-10-CM

## 2023-11-06 DIAGNOSIS — I2510 Atherosclerotic heart disease of native coronary artery without angina pectoris: Secondary | ICD-10-CM

## 2023-11-09 ENCOUNTER — Encounter (HOSPITAL_BASED_OUTPATIENT_CLINIC_OR_DEPARTMENT_OTHER): Payer: Self-pay | Admitting: Radiology

## 2023-11-09 ENCOUNTER — Ambulatory Visit (HOSPITAL_BASED_OUTPATIENT_CLINIC_OR_DEPARTMENT_OTHER)
Admission: RE | Admit: 2023-11-09 | Discharge: 2023-11-09 | Disposition: A | Discharge status: Home / Self Care | Payer: PPO | Source: Ambulatory Visit | Attending: Obstetrics & Gynecology | Admitting: Obstetrics & Gynecology

## 2023-11-09 DIAGNOSIS — Z1231 Encounter for screening mammogram for malignant neoplasm of breast: Secondary | ICD-10-CM | POA: Diagnosis not present

## 2023-11-09 DIAGNOSIS — E7800 Pure hypercholesterolemia, unspecified: Secondary | ICD-10-CM | POA: Diagnosis not present

## 2023-11-09 DIAGNOSIS — Z5181 Encounter for therapeutic drug level monitoring: Secondary | ICD-10-CM | POA: Diagnosis not present

## 2023-11-09 DIAGNOSIS — I2510 Atherosclerotic heart disease of native coronary artery without angina pectoris: Secondary | ICD-10-CM | POA: Diagnosis not present

## 2023-11-10 LAB — NMR, LIPOPROFILE
Cholesterol, Total: 181 mg/dL (ref 100–199)
HDL Particle Number: 37.8 umol/L (ref >=30.5)
HDL-C: 81 mg/dL (ref >39)
LDL Particle Number: 651 nmol/L (ref <1000)
LDL Size: 21 nm (ref >20.5)
LDL-C (NIH Calc): 86 mg/dL (ref 0–99)
LP-IR Score: <25 (ref <=45)
Small LDL Particle Number: <90 nmol/L (ref <=527)
Triglycerides: 75 mg/dL (ref 0–149)

## 2023-11-13 ENCOUNTER — Encounter (HOSPITAL_BASED_OUTPATIENT_CLINIC_OR_DEPARTMENT_OTHER): Payer: Self-pay | Admitting: Cardiology

## 2023-11-13 ENCOUNTER — Ambulatory Visit (HOSPITAL_BASED_OUTPATIENT_CLINIC_OR_DEPARTMENT_OTHER): Payer: PPO | Admitting: Cardiology

## 2023-11-13 VITALS — BP 104/60 | HR 67 | Ht 61.0 in | Wt 116.6 lb

## 2023-11-13 DIAGNOSIS — E7800 Pure hypercholesterolemia, unspecified: Secondary | ICD-10-CM

## 2023-11-13 DIAGNOSIS — Z712 Person consulting for explanation of examination or test findings: Secondary | ICD-10-CM | POA: Diagnosis not present

## 2023-11-13 DIAGNOSIS — Z7189 Other specified counseling: Secondary | ICD-10-CM

## 2023-11-13 DIAGNOSIS — Z1589 Genetic susceptibility to other disease: Secondary | ICD-10-CM | POA: Diagnosis not present

## 2023-11-13 DIAGNOSIS — I2510 Atherosclerotic heart disease of native coronary artery without angina pectoris: Secondary | ICD-10-CM

## 2023-11-13 NOTE — Progress Notes (Signed)
Cardiology Office Note:  .   Date:  11/13/2023  ID:  Cheyenne Myers, DOB [birth year], MRN [id number] PCP: Shelva Majestic, MD  [facility] Providers Cardiologist:  Jodelle Red, MD {  History of Present Illness: .   Cheyenne Myers is a 67 y.o. female with PMH mitral valve prolapse, hyperlipidemia. I met her 11/06/22 for new evaluation of her lipids.  Pertinent CV history: coronary calcium score 07/2022 of 2 (53rd percentile, LAD artery). Started rosuvastatin 2023.  Family history:  Her biological father died at 28 yo of a heart attack. Her mother had alzheimer's. Her 2 sisters also are diagnosed with alzheimer's, had prior strokes.   Today: Doing well. Just came back from 45 day cruise to [place], was very active during the trip. No limitations.Took up to 35 flights of stairs per day on the cruise. Discussed her labs, heart rate variability and available data, etc.  ROS: Denies chest pain, shortness of breath at rest or with normal exertion. No PND, orthopnea, LE edema or unexpected weight gain. No syncope or palpitations. ROS otherwise negative except as noted.   Studies Reviewed: Marland Kitchen    EKG:  EKG Interpretation Date/Time:  Friday November 13 2023 08:52:15 EST Ventricular Rate:  67 PR Interval:  164 QRS Duration:  74 QT Interval:  406 QTC Calculation: 429 R Axis:   15  Text Interpretation: Normal sinus rhythm Septal infarct , age undetermined Confirmed by Jodelle Red [phone]) on 11/13/2023 8:58:34 AM    Physical Exam:   VS:  BP 104/60   Pulse 67   Ht 5\' 1"  (1.549 m)   Wt 116 lb 9.6 oz (52.9 kg)   LMP 10/17/2009 (Exact Date)   SpO2 97%   BMI 22.03 kg/m    Wt Readings from Last 3 Encounters:  11/13/23 116 lb 9.6 oz (52.9 kg)  09/08/23 117 lb (53.1 kg)  07/22/23 113 lb 6.4 oz (51.4 kg)    GEN: Well nourished, well developed in no acute distress HEENT: Normal, moist mucous membranes NECK: No JVD CARDIAC: regular rhythm, normal S1 and S2, no rubs or  gallops. No murmur. VASCULAR: Radial and DP pulses 2+ bilaterally. No carotid bruits RESPIRATORY:  Clear to auscultation without rales, wheezing or rhonchi  ABDOMEN: Soft, non-tender, non-distended MUSCULOSKELETAL:  Ambulates independently SKIN: Warm and dry, no edema NEUROLOGIC:  Alert and oriented x 3. No focal neuro deficits noted. PSYCHIATRIC:  Normal affect    ASSESSMENT AND PLAN: .    Coronary artery calcification Self-reported ApoE 2/4 genotype Elevated apoB (113, normal <90) Hypercholesterolemia -lp(a) normal at 65 -particle number decreased from 1385 to 651 (now normal), particle size normal -LDL decreased from 149 to 86 -HDL remains excellent at 81, TG excellent at 75 -she is tolerating low dose rosuvastatin twice a week, no issues -we discussed goals today. From a primary prevention standpoint, reasonable to have LDL <100. We did discuss more aggressive targets of LDL <70 based on her Ca score or LDL <55 for aggressive target. She will think about this and let me know -LPIR score was normal.  CV risk counseling and prevention -recommend heart healthy/Mediterranean diet, with whole grains, fruits, vegetable, fish, lean meats, nuts, and olive oil. Limit salt. -recommend moderate walking, 3-5 times/week for 30-50 minutes each session. Aim for at least 150 minutes.week. Goal should be pace of 3 miles/hours, or walking 1.5 miles in 30 minutes -recommend avoidance of tobacco products. Avoid excess alcohol.  Dispo: 1 year or sooner as needed  Signed, Jodelle Red, MD   Jodelle Red, MD, PhD, [facility]  [facility]  [place]  [facility] at [facility] [address] [place], [place] [address] [phone]

## 2023-11-13 NOTE — Patient Instructions (Signed)
Medication Instructions:  Your physician recommends that you continue on your current medications as directed. Please refer to the Current Medication list given to you today.  *If you need a refill on your cardiac medications before your next appointment, please call your pharmacy*   Follow-Up: At [facility], you and your health needs are our priority.  As part of our continuing mission to provide you with exceptional heart care, we have created designated Provider Care Teams.  These Care Teams include your primary Cardiologist (physician) and Advanced Practice Providers (APPs -  Physician Assistants and Nurse Practitioners) who all work together to provide you with the care you need, when you need it.  We recommend signing up for the patient portal called "MyChart".  Sign up information is provided on this After Visit Summary.  MyChart is used to connect with patients for Virtual Visits (Telemedicine).  Patients are able to view lab/test results, encounter notes, upcoming appointments, etc.  Non-urgent messages can be sent to your provider as well.   To learn more about what you can do with MyChart, go to [email or web address].    Your next appointment:   12 month(s)  Provider:   Buford Dresser, MD

## 2023-11-26 DIAGNOSIS — H40053 Ocular hypertension, bilateral: Secondary | ICD-10-CM | POA: Diagnosis not present

## 2023-11-26 DIAGNOSIS — Z961 Presence of intraocular lens: Secondary | ICD-10-CM | POA: Diagnosis not present

## 2023-12-24 ENCOUNTER — Other Ambulatory Visit: Payer: Self-pay

## 2023-12-24 ENCOUNTER — Encounter: Payer: Self-pay | Admitting: Family Medicine

## 2023-12-24 MED ORDER — ACYCLOVIR 200 MG PO CAPS: 200.0000 mg | ORAL_CAPSULE | Freq: Every day | ORAL | 3 refills | Status: AC

## 2023-12-24 MED ORDER — ROSUVASTATIN CALCIUM 5 MG PO TABS: 5.0000 mg | ORAL_TABLET | Freq: Every day | ORAL | 3 refills | Status: AC

## 2023-12-27 ENCOUNTER — Encounter: Payer: Self-pay | Admitting: Family Medicine

## 2023-12-28 ENCOUNTER — Other Ambulatory Visit: Payer: Self-pay

## 2023-12-28 DIAGNOSIS — M85852 Other specified disorders of bone density and structure, left thigh: Secondary | ICD-10-CM

## 2024-01-09 ENCOUNTER — Encounter (HOSPITAL_BASED_OUTPATIENT_CLINIC_OR_DEPARTMENT_OTHER): Payer: Self-pay

## 2024-01-20 ENCOUNTER — Ambulatory Visit (INDEPENDENT_AMBULATORY_CARE_PROVIDER_SITE_OTHER)
Admission: RE | Admit: 2024-01-20 | Discharge: 2024-01-20 | Disposition: A | Discharge status: Home / Self Care | Payer: PPO | Source: Ambulatory Visit | Attending: Family Medicine | Admitting: Family Medicine

## 2024-01-20 ENCOUNTER — Encounter: Payer: Self-pay | Admitting: Family Medicine

## 2024-01-20 DIAGNOSIS — M85852 Other specified disorders of bone density and structure, left thigh: Secondary | ICD-10-CM | POA: Diagnosis not present

## 2024-01-25 ENCOUNTER — Encounter (HOSPITAL_BASED_OUTPATIENT_CLINIC_OR_DEPARTMENT_OTHER): Payer: Self-pay | Admitting: Obstetrics & Gynecology

## 2024-01-27 ENCOUNTER — Other Ambulatory Visit (HOSPITAL_BASED_OUTPATIENT_CLINIC_OR_DEPARTMENT_OTHER): Payer: Self-pay | Admitting: Obstetrics & Gynecology

## 2024-01-27 DIAGNOSIS — Z79890 Hormone replacement therapy (postmenopausal): Secondary | ICD-10-CM

## 2024-01-29 LAB — LIPID PANEL
Cholesterol: 163
HDL: 62
LDL: 86
Triglycerides: 74 (ref 40–160)

## 2024-01-29 LAB — HEMOGLOBIN A1C: Hemoglobin A1C: 5.4

## 2024-01-29 NOTE — Progress Notes (Signed)
 No pressing issues at this time.

## 2024-02-04 ENCOUNTER — Other Ambulatory Visit (HOSPITAL_BASED_OUTPATIENT_CLINIC_OR_DEPARTMENT_OTHER): Payer: HMO

## 2024-02-04 ENCOUNTER — Other Ambulatory Visit (HOSPITAL_BASED_OUTPATIENT_CLINIC_OR_DEPARTMENT_OTHER): Payer: Self-pay

## 2024-02-04 DIAGNOSIS — Z79890 Hormone replacement therapy (postmenopausal): Secondary | ICD-10-CM

## 2024-02-06 LAB — ESTRADIOL: Estradiol: 87.2 pg/mL — ABNORMAL HIGH (ref 0.0–54.7)

## 2024-02-06 LAB — TESTOSTERONE, TOTAL, LC/MS/MS: Testosterone, total: 24.7 ng/dL (ref 7.0–40.0)

## 2024-02-08 ENCOUNTER — Encounter (HOSPITAL_BASED_OUTPATIENT_CLINIC_OR_DEPARTMENT_OTHER): Payer: Self-pay | Admitting: Obstetrics & Gynecology

## 2024-02-09 LAB — SPECIMEN STATUS REPORT

## 2024-02-09 LAB — PROGESTERONE: Progesterone: 7.5 ng/mL

## 2024-02-18 DIAGNOSIS — L661 Lichen planopilaris, unspecified: Secondary | ICD-10-CM | POA: Diagnosis not present

## 2024-02-18 DIAGNOSIS — D2362 Other benign neoplasm of skin of left upper limb, including shoulder: Secondary | ICD-10-CM | POA: Diagnosis not present

## 2024-02-18 DIAGNOSIS — L821 Other seborrheic keratosis: Secondary | ICD-10-CM | POA: Diagnosis not present

## 2024-02-18 DIAGNOSIS — L720 Epidermal cyst: Secondary | ICD-10-CM | POA: Diagnosis not present

## 2024-02-18 DIAGNOSIS — L570 Actinic keratosis: Secondary | ICD-10-CM | POA: Diagnosis not present

## 2024-02-18 DIAGNOSIS — Z85828 Personal history of other malignant neoplasm of skin: Secondary | ICD-10-CM | POA: Diagnosis not present

## 2024-02-18 DIAGNOSIS — L259 Unspecified contact dermatitis, unspecified cause: Secondary | ICD-10-CM | POA: Diagnosis not present

## 2024-02-18 DIAGNOSIS — D225 Melanocytic nevi of trunk: Secondary | ICD-10-CM | POA: Diagnosis not present

## 2024-02-18 DIAGNOSIS — L578 Other skin changes due to chronic exposure to nonionizing radiation: Secondary | ICD-10-CM | POA: Diagnosis not present

## 2024-02-25 ENCOUNTER — Ambulatory Visit (INDEPENDENT_AMBULATORY_CARE_PROVIDER_SITE_OTHER): Payer: PPO | Admitting: Obstetrics & Gynecology

## 2024-02-25 ENCOUNTER — Encounter (HOSPITAL_BASED_OUTPATIENT_CLINIC_OR_DEPARTMENT_OTHER): Payer: Self-pay

## 2024-02-25 ENCOUNTER — Encounter (HOSPITAL_BASED_OUTPATIENT_CLINIC_OR_DEPARTMENT_OTHER): Payer: Self-pay | Admitting: Obstetrics & Gynecology

## 2024-02-25 ENCOUNTER — Other Ambulatory Visit (HOSPITAL_COMMUNITY)
Admission: RE | Admit: 2024-02-25 | Discharge: 2024-02-25 | Disposition: A | Discharge status: Home / Self Care | Source: Ambulatory Visit | Attending: Obstetrics & Gynecology | Admitting: Obstetrics & Gynecology

## 2024-02-25 VITALS — BP 135/83 | HR 70 | Ht 61.0 in | Wt 118.0 lb

## 2024-02-25 DIAGNOSIS — Z124 Encounter for screening for malignant neoplasm of cervix: Secondary | ICD-10-CM | POA: Diagnosis not present

## 2024-02-25 DIAGNOSIS — L659 Nonscarring hair loss, unspecified: Secondary | ICD-10-CM

## 2024-02-25 DIAGNOSIS — Z8619 Personal history of other infectious and parasitic diseases: Secondary | ICD-10-CM

## 2024-02-25 DIAGNOSIS — N952 Postmenopausal atrophic vaginitis: Secondary | ICD-10-CM

## 2024-02-25 DIAGNOSIS — Z01419 Encounter for gynecological examination (general) (routine) without abnormal findings: Secondary | ICD-10-CM

## 2024-02-25 DIAGNOSIS — N950 Postmenopausal bleeding: Secondary | ICD-10-CM | POA: Diagnosis not present

## 2024-02-25 DIAGNOSIS — Z860101 Personal history of adenomatous and serrated colon polyps: Secondary | ICD-10-CM

## 2024-02-25 DIAGNOSIS — N393 Stress incontinence (female) (male): Secondary | ICD-10-CM | POA: Diagnosis not present

## 2024-02-25 DIAGNOSIS — Z79890 Hormone replacement therapy (postmenopausal): Secondary | ICD-10-CM

## 2024-02-25 DIAGNOSIS — M85852 Other specified disorders of bone density and structure, left thigh: Secondary | ICD-10-CM | POA: Diagnosis not present

## 2024-02-25 MED ORDER — PROGESTERONE 200 MG PO CAPS: 200.0000 mg | ORAL_CAPSULE | Freq: Every day | ORAL | 3 refills | Status: AC

## 2024-02-25 MED ORDER — ESTRADIOL 0.05 MG/24HR TD PTTW: 1.0000 | MEDICATED_PATCH | TRANSDERMAL | 3 refills | Status: DC

## 2024-02-25 MED ORDER — ESTRADIOL 0.1 MG/GM VA CREA
TOPICAL_CREAM | VAGINAL | 3 refills | Status: AC
Start: 2024-02-25 — End: ?

## 2024-02-25 NOTE — Telephone Encounter (Signed)
**Note De-identified  [place] Obfuscation** Please advise 

## 2024-02-25 NOTE — Progress Notes (Signed)
 Breast and Pelvic Patient name: Cheyenne Myers MRN [id number]  Date of birth: 11/28/1956 Chief Complaint:   Breast and Pelvic  History of Present Illness:   Kamalei Roeder Sanpedro is a 68 y.o. G81P2002 Caucasian female being seen today for breast and pelvic exam.  Is on HRT.  Needs RF.  Feels progesterone dosing is good because it's helping with sleep.  Has switched to patch so needs RF for this.  Does have some concerns about hair thinning.  Would like thyroid testing.  Denies vaginal bleeding.    Does have some issues with urinary incontinence.  When she coughs or goes to run, she will leak.  Doesn't leak typically at night.  Doesn't need to wear a pad unless she has something like a cold and has a lot of coughing.  Treatment options discussed.  Would like PT referral.       Patient's last menstrual period was 10/17/2009 (exact date).   Last pap 02/21/2022. Results were: NILM w/ HRHPV negative. H/O abnormal pap: no Last mammogram: 11/09/2023. Results were: normal. Family h/o breast cancer: yes  MGM Last colonoscopy: 09/03/2021. F/u 5 years. Family h/o colorectal cancer: yes in 1/2 sister BMD:  01/20/2024.  Osteopenia.      02/25/2024    2:01 PM 09/08/2023    1:36 PM 07/22/2023    8:02 AM 05/29/2023    8:27 AM 04/13/2023   10:12 AM  Depression screen PHQ 2/9  Decreased Interest 0 0 0 0 0  Down, Depressed, Hopeless 0 0 0 0 0  PHQ - 2 Score 0 0 0 0 0  Altered sleeping   0 0 0  Tired, decreased energy   0 0 0  Change in appetite   0 0 0  Feeling bad or failure about yourself    0 0 0  Trouble concentrating   0 0 0  Moving slowly or fidgety/restless   0 0 0  Suicidal thoughts   0 0 0  PHQ-9 Score   0 0 0  Difficult doing work/chores   Not difficult at all Not difficult at all Not difficult at all      Review of Systems:   Pertinent items are noted in HPI  Denies abdominal pain, vaginal discharge, vaginal bleeding, bowel changes.  Urinary issues as stated above.  Pertinent History Reviewed:   Reviewed past medical,surgical, social and family history.   Reviewed problem list, medications and allergies. Physical Assessment:   Vitals:   02/25/24 1358  BP: 135/83  Pulse: 70  Weight: 118 lb (53.5 kg)  Height: 5\' 1"  (1.549 m)  Body mass index is 22.3 kg/m.        Physical Examination:   General appearance - well appearing, and in no distress  Mental status - alert, oriented to person, place, and time  Psych:  She has a normal mood and affect  Skin - warm and dry, normal color, no suspicious lesions noted  Chest - effort normal, all lung fields clear to auscultation bilaterally  Heart - normal rate and regular rhythm  Neck:  midline trachea, no thyromegaly or nodules  Breasts - breasts appear normal, no suspicious masses, no skin or nipple changes or  axillary nodes  Abdomen - soft, nontender, nondistended, no masses or organomegaly  Pelvic - VULVA: normal appearing vulva with no masses, tenderness or lesions   VAGINA: normal appearing vagina with normal color and discharge, no lesions, dark mucousy blood present at os  CERVIX: normal appearing  cervix without discharge or lesions, no CMT  Thin prep pap is obtained today.  UTERUS: uterus is felt to be normal size, shape, consistency and nontender   ADNEXA: No adnexal masses or tenderness noted.  Rectal - normal rectal, good sphincter tone, no masses felt.   Extremities:  No swelling or varicosities noted  Chaperone present for exam.  No results found for this or any previous visit (from the past 24 hours).  Assessment & Plan:  1. Encounter for routine gynecologic examination in Medicare patient (Primary) - Pap smear 02/2022.  Updated today. - Mammogram 10/2023 - Colonoscopy 08/2021.  F/u 5 years. - Bone mineral density 12/2023 - lab work done with PCP, Dr. Durene Cal - vaccines reviewed/updated  2. Cervical cancer screening - Cytology - PAP( [facility]) - PR OBTAINING SCREEN PAP SMEAR  3. H/O cold sores - does not  need RF for antiviral  4. Hx of adenomatous polyp of colon  5. Osteopenia of left femoral neck - taking Vit D.    6. Hormone replacement therapy - estradiol (VIVELLE-DOT) 0.05 MG/24HR patch; Place 1 patch (0.05 mg total) onto the skin 2 (two) times a week.  Dispense: 24 patch; Refill: 3 - progesterone (PROMETRIUM) 200 MG capsule; Take 1 capsule (200 mg total) by mouth daily.  Dispense: 100 capsule; Refill: 3  7. Alopecia - Thyroid Panel With TSH  8. Vaginal atrophy - rx for estradiol vaginal cream to pharmacy.  1 gram pv twice weekly.  Pt not to start until after ultrasound is completed.  9. SUI (stress urinary incontinence, female) - Ambulatory referral to Physical Therapy  10. Postmenopausal bleeding - discussed finding with pt.  She will return for ultrasound.  Additional recommendations will be made at that time. - US PELVIS TRANSVAGINAL NON-OB (TV ONLY); Future    Meds:  Meds ordered this encounter  Medications   estradiol (VIVELLE-DOT) 0.05 MG/24HR patch    Sig: Place 1 patch (0.05 mg total) onto the skin 2 (two) times a week.    Dispense:  24 patch    Refill:  3   progesterone (PROMETRIUM) 200 MG capsule    Sig: Take 1 capsule (200 mg total) by mouth daily.    Dispense:  100 capsule    Refill:  3   estradiol (ESTRACE) 0.1 MG/GM vaginal cream    Sig: 1 gram vaginally twice weekly    Dispense:  42.5 g    Refill:  3    Follow-up: Return for ultrasound on 3/12.  Jerene Bears, MD 02/28/2024 5:15 PM

## 2024-02-26 ENCOUNTER — Encounter (HOSPITAL_BASED_OUTPATIENT_CLINIC_OR_DEPARTMENT_OTHER): Payer: Self-pay | Admitting: Obstetrics & Gynecology

## 2024-02-26 ENCOUNTER — Other Ambulatory Visit (HOSPITAL_BASED_OUTPATIENT_CLINIC_OR_DEPARTMENT_OTHER): Payer: Self-pay | Admitting: Obstetrics & Gynecology

## 2024-02-26 DIAGNOSIS — L659 Nonscarring hair loss, unspecified: Secondary | ICD-10-CM

## 2024-02-26 LAB — THYROID PANEL WITH TSH
Free Thyroxine Index: 1.9 (ref 1.2–4.9)
T3 Uptake Ratio: 24 % (ref 24–39)
T4, Total: 7.8 ug/dL (ref 4.5–12.0)
TSH: 1.41 u[IU]/mL (ref 0.450–4.500)

## 2024-02-29 ENCOUNTER — Other Ambulatory Visit (HOSPITAL_BASED_OUTPATIENT_CLINIC_OR_DEPARTMENT_OTHER): Payer: Self-pay | Admitting: *Deleted

## 2024-02-29 DIAGNOSIS — L659 Nonscarring hair loss, unspecified: Secondary | ICD-10-CM

## 2024-02-29 LAB — T4, FREE: Free T4: 1.21 ng/dL (ref 0.82–1.77)

## 2024-02-29 LAB — SPECIMEN STATUS REPORT

## 2024-02-29 LAB — T3, FREE: T3, Free: 2.4 pg/mL (ref 2.0–4.4)

## 2024-03-02 ENCOUNTER — Ambulatory Visit (INDEPENDENT_AMBULATORY_CARE_PROVIDER_SITE_OTHER)

## 2024-03-02 ENCOUNTER — Ambulatory Visit (HOSPITAL_BASED_OUTPATIENT_CLINIC_OR_DEPARTMENT_OTHER): Admitting: Obstetrics & Gynecology

## 2024-03-02 VITALS — BP 130/84 | HR 75 | Ht 61.0 in | Wt 117.0 lb

## 2024-03-02 DIAGNOSIS — Z79890 Hormone replacement therapy (postmenopausal): Secondary | ICD-10-CM

## 2024-03-02 DIAGNOSIS — N950 Postmenopausal bleeding: Secondary | ICD-10-CM | POA: Diagnosis not present

## 2024-03-02 DIAGNOSIS — D251 Intramural leiomyoma of uterus: Secondary | ICD-10-CM

## 2024-03-02 LAB — CYTOLOGY - PAP
Adequacy: ABSENT
Diagnosis: NEGATIVE

## 2024-03-04 ENCOUNTER — Encounter (HOSPITAL_BASED_OUTPATIENT_CLINIC_OR_DEPARTMENT_OTHER): Payer: Self-pay | Admitting: Obstetrics & Gynecology

## 2024-03-04 NOTE — Progress Notes (Signed)
 GYNECOLOGY  VISIT  CC:   Discuss ultrasound results, PMP bleeding  HPI: 68 y.o. G76P2002 Married White or Caucasian female here for discussion of ultrasound results.  Ultrasound obtained due to single episode of postmenopausal bleeding noted when she was here on March 6.  The bleeding is noted on exam.  Patient reports she had a tiny amount of spotting that resolved.  She is on HRT and there have been some adjustments in this over the last few months.  On ultrasound today the uterus measures about 7 x 5 x 4 cm and the endometrium is 2.0 to 3.3 mm.  It is symmetric.  There is no vascularity.  She is a small intramural fibroid measuring 1.3 cm.  Right ovary was atrophic.  Left ovary is not well-visualized but no masses or cysts were noted.  Pap obtained 3/6 still pending.  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  She did have a Pap in 2023 that was negative with negative high-risk HPV.  Questions about additional thyroid testing results discussed.  Past Medical History:  Diagnosis Date   Abnormal Pap smear 06/2004   ASCUS, + HPV, CIN II LEEP   Arthritis    Cataract    Cervical radicular pain    managed by [facility]   Epstein Barr infection    Glaucoma    Heart murmur    Hx of adenomatous polyp of colon 02/13/2016   Hyperlipemia    MITRAL VALVE PROLAPSE 08/05/2007   Qualifier: Diagnosis of  By: Briscoe Burns CMA, Alvy Beal      MEDS:   Current Outpatient Medications on File Prior to Visit  Medication Sig Dispense Refill   acyclovir (ZOVIRAX) 200 MG capsule Take 1 capsule (200 mg total) by mouth daily. 100 capsule 3   Cholecalciferol (VITAMIN D3 PO) Take 5,000 Units by mouth.     Cyanocobalamin (VITAMIN B 12 PO) Take by mouth.     estradiol (ESTRACE) 0.1 MG/GM vaginal cream 1 gram vaginally twice weekly 42.5 g 3   estradiol (VIVELLE-DOT) 0.05 MG/24HR patch Place 1 patch (0.05 mg total) onto the skin 2 (two) times a week. 24 patch 3   Glucosamine 750 MG TABS Take 2 tablets by mouth daily.      KRILL OIL 1000 MG CAPS Take 2 capsules (2,000 mg total) by mouth 2 (two) times daily.     latanoprost (XALATAN) 0.005 % ophthalmic solution   6   Menaquinone-7 (VITAMIN K2 PO) Take by mouth every other day.      Omega-3 1000 MG CAPS Take by mouth.     progesterone (PROMETRIUM) 200 MG capsule Take 1 capsule (200 mg total) by mouth daily. 100 capsule 3   Pyridoxine HCl (B-6) 50 MG TABS Take by mouth.     rosuvastatin (CRESTOR) 5 MG tablet Take 1 tablet (5 mg total) by mouth daily. 100 tablet 3   scopolamine (TRANSDERM-SCOP) 1 MG/3DAYS APPLY 1 PATCH TO HAIRLESS SKIN BEHIND 1 EAR AND LEAVE ON FOR UP TO 3 DAYS (Patient taking differently: as needed.) 4 patch 0   Ubiquinol 100 MG CAPS Take by mouth.     UNABLE TO FIND 500 mg. Curcumin     No current facility-administered medications on file prior to visit.    ALLERGIES: Patient has no known allergies.  SH: Married, non-smoker  Review of Systems  Constitutional: Negative.   Genitourinary: Negative.     PHYSICAL EXAMINATION:    BP 130/84 (BP Location: Left Arm, Patient Position:  Sitting, Cuff Size: Normal)   Pulse 75   Ht 5\' 1"  (1.549 m)   Wt 117 lb (53.1 kg)   LMP 10/17/2009 (Exact Date)   BMI 22.11 kg/m      Physical Exam Constitutional:      Appearance: Normal appearance.  Neurological:     General: No focal deficit present.     Mental Status: She is alert.  Psychiatric:        Mood and Affect: Mood normal.        Behavior: Behavior normal.     Assessment/Plan: 1. Postmenopausal HRT (hormone replacement therapy) (Primary) -Endometrium is thin today and without any vascularity.  As there have been some recent changes with her HRT, I think is reasonable to monitor.  However if she has any additional bleeding, I have recommended endometrial biopsy.  She is advised to call back if she has any bleeding.  2. Fibroids, intramural -Patient would like to be sure that this is not changing given the HRT use.  Follow-up in 6  months ordered and scheduled. - US PELVIS TRANSVAGINAL NON-OB (TV ONLY); Future

## 2024-03-23 NOTE — Progress Notes (Signed)
 Pt attended 01/29/24 screening event with A1C was 5.4. Pt noted at event that she does have a PCP. At event pt did not indicate any SDOH needs. Pt also noted that she is not a smoker.  Per chart review pt does have a PCP Tana Conch; [facility]), insurance, and is not a smoker. Pt's last appt with PCP was 07/22/2023 and has an upcoming appt on 07/26/2024. Pt does not indicate any SDOH needs at this time.  No additional pt f/u to be scheduled at this time per health equity protocol.

## 2024-04-18 ENCOUNTER — Ambulatory Visit (INDEPENDENT_AMBULATORY_CARE_PROVIDER_SITE_OTHER): Payer: PPO

## 2024-04-18 VITALS — Ht 61.0 in | Wt 117.0 lb

## 2024-04-18 DIAGNOSIS — Z0000 Encounter for general adult medical examination without abnormal findings: Secondary | ICD-10-CM | POA: Diagnosis not present

## 2024-04-18 NOTE — Patient Instructions (Signed)
 Ms. Cheyenne Myers , Thank you for taking time to come for your Medicare Wellness Visit. I appreciate your ongoing commitment to your health goals. Please review the following plan we discussed and let me know if I can assist you in the future.   Referrals/Orders/Follow-Ups/Clinician Recommendations: maintain health and activity   This is a list of the screening recommended for you and due dates:  Health Maintenance  Topic Date Due   Medicare Annual Wellness Visit  04/12/2024   Flu Shot  07/22/2024   Mammogram  11/08/2024   DTaP/Tdap/Td vaccine (3 - Td or Tdap) 10/22/2025   Colon Cancer Screening  09/03/2026   DEXA scan (bone density measurement)  01/19/2027   Hepatitis C Screening  Completed   Zoster (Shingles) Vaccine  Completed   HPV Vaccine  Aged Out   Meningitis B Vaccine  Aged Out   Pneumonia Vaccine  Discontinued   COVID-19 Vaccine  Discontinued    Advanced directives: (In Chart) A copy of your advanced directives are scanned into your chart should your provider ever need it.  Next Medicare Annual Wellness Visit scheduled for next year: Yes

## 2024-04-18 NOTE — Progress Notes (Signed)
 Subjective:   Cheyenne Myers is a 68 y.o. who presents for a Medicare Wellness preventive visit.  Visit Complete: Virtual I connected with  Cheyenne Myers on 04/18/24 by a audio enabled telemedicine application and verified that I am speaking with the correct person using two identifiers.  Patient Location: Home  Provider Location: Office/Clinic  I discussed the limitations of evaluation and management by telemedicine. The patient expressed understanding and agreed to proceed.  Vital Signs: Because this visit was a virtual/telehealth visit, some criteria may be missing or patient reported. Any vitals not documented were not able to be obtained and vitals that have been documented are patient reported.  VideoDeclined- This patient declined Librarian, academic. Therefore the visit was completed with audio only.  Persons Participating in Visit: Patient.  AWV Questionnaire: No: Patient Medicare AWV questionnaire was not completed prior to this visit.  Cardiac Risk Factors include: advanced age (>51men, >70 women);dyslipidemia     Objective:    Today's Vitals   04/18/24 1129  Weight: 117 lb (53.1 kg)  Height: 5\' 1"  (1.549 m)   Body mass index is 22.11 kg/m.     04/18/2024   11:33 AM 04/13/2023   10:13 AM 09/05/2022    8:05 AM 01/24/2016    3:57 PM  Advanced Directives  Does Patient Have a Medical Advance Directive? Yes Yes Yes Yes  Type of Estate agent of [place];Living will Healthcare Power of [place];Living will Healthcare Power of [place];Living will Healthcare Power of [place];Living will  Does patient want to make changes to medical advance directive? No - Patient declined No - Patient declined No - Patient declined No - Patient declined  Copy of Healthcare Power of Attorney in Chart? Yes - validated most recent copy scanned in chart (See row information) Yes - validated most recent copy scanned in chart (See row information) Yes  - validated most recent copy scanned in chart (See row information) No - copy requested    Current Medications (verified) Outpatient Encounter Medications as of 04/18/2024  Medication Sig   acyclovir  (ZOVIRAX ) 200 MG capsule Take 1 capsule (200 mg total) by mouth daily.   Cholecalciferol (VITAMIN D3 PO) Take 5,000 Units by mouth.   Cyanocobalamin  (VITAMIN B 12 PO) Take by mouth.   estradiol  (ESTRACE ) 0.1 MG/GM vaginal cream 1 gram vaginally twice weekly   estradiol  (VIVELLE -DOT) 0.05 MG/24HR patch Place 1 patch (0.05 mg total) onto the skin 2 (two) times a week.   Glucosamine 750 MG TABS Take 2 tablets by mouth daily.   KRILL OIL 1000 MG CAPS Take 2 capsules (2,000 mg total) by mouth 2 (two) times daily.   latanoprost (XALATAN) 0.005 % ophthalmic solution    Menaquinone-7 (VITAMIN K2 PO) Take by mouth every other day.    Omega-3 1000 MG CAPS Take by mouth.   progesterone  (PROMETRIUM ) 200 MG capsule Take 1 capsule (200 mg total) by mouth daily.   Pyridoxine HCl (B-6) 50 MG TABS Take by mouth.   rosuvastatin  (CRESTOR ) 5 MG tablet Take 1 tablet (5 mg total) by mouth daily.   Ubiquinol 100 MG CAPS Take by mouth.   UNABLE TO FIND 500 mg. Curcumin   scopolamine  (TRANSDERM-SCOP) 1 MG/3DAYS APPLY 1 PATCH TO HAIRLESS SKIN BEHIND 1 EAR AND LEAVE ON FOR UP TO 3 DAYS (Patient not taking: Reported on 04/18/2024)   No facility-administered encounter medications on file as of 04/18/2024.    Allergies (verified) Patient has no known allergies.  History: Past Medical History:  Diagnosis Date   Abnormal Pap smear 06/2004   ASCUS, + HPV, CIN II LEEP   Arthritis    Cataract    Cervical radicular pain    managed by [facility]   Epstein Barr infection    Glaucoma    Heart murmur    Hx of adenomatous polyp of colon 02/13/2016   Hyperlipemia    MITRAL VALVE PROLAPSE 08/05/2007   Qualifier: Diagnosis of  By: Foy Imam CMA, [place].Cornelia     Past Surgical History:  Procedure Laterality Date   CARPAL TUNNEL  RELEASE Right 08/2011   CATARACT EXTRACTION Bilateral    CERVICAL BIOPSY  W/ LOOP ELECTRODE EXCISION  06/2004   CIN II   COLONOSCOPY     COLPOSCOPY     WISDOM TOOTH EXTRACTION  age 3   Family History  Problem Relation Age of Onset   Heart disease Mother        age 44   Diabetes Mother    Alzheimer's disease Mother    Heart attack Father        age 6 died- but was smoker.    Stroke Sister        tia and pacer   Atrial fibrillation Sister    Dementia Sister        early   Transient ischemic attack Sister    Dementia Sister        severe emotional disturbance   Colon cancer Sister        32   Diabetes Maternal Grandmother    Breast cancer Maternal Grandmother    Diabetes Maternal Grandfather    Colon polyps Neg Hx    Esophageal cancer Neg Hx    Rectal cancer Neg Hx    Stomach cancer Neg Hx    Social History   Socioeconomic History   Marital status: Married    Spouse name: Not on file   Number of children: Not on file   Years of education: Not on file   Highest education level: Bachelor's degree (e.g., BA, AB, BS)  Occupational History   Not on file  Tobacco Use   Smoking status: Never   Smokeless tobacco: Never  Vaping Use   Vaping status: Never Used  Substance and Sexual Activity   Alcohol use: No    Alcohol/week: 0.0 standard drinks of alcohol   Drug use: No   Sexual activity: Not Currently    Partners: Male    Birth control/protection: Post-menopausal  Other Topics Concern   Not on file  Social History Narrative   Married. 2 children. 2 grandkids 5 and 1.5 in 05/2017.       Retired Animal nutritionist at [employer] Situation: lives with husband      Lifestyle: regular walking, healthy diet   Hobbies: travel, camping, hike, bike, church- [employer]   Social Drivers of Health   Financial Resource Strain: Low Risk  (04/18/2024)   Overall Financial Resource Strain (CARDIA)    Difficulty of Paying Living Expenses: Not hard at all  Food  Insecurity: No Food Insecurity (04/18/2024)   Hunger Vital Sign    Worried About Running Out of Food in the Last Year: Never true    Ran Out of Food in the Last Year: Never true  Transportation Needs: No Transportation Needs (04/18/2024)   PRAPARE - Administrator, Civil Service (Medical): No    Lack of Transportation (Non-Medical): No  Physical Activity:  Sufficiently Active (04/18/2024)   Exercise Vital Sign    Days of Exercise per Week: 6 days    Minutes of Exercise per Session: 120 min  Stress: No Stress Concern Present (04/18/2024)   [employer] of Occupational Health - Occupational Stress Questionnaire    Feeling of Stress : Not at all  Social Connections: Socially Integrated (04/18/2024)   Social Connection and Isolation Panel [NHANES]    Frequency of Communication with Friends and Family: More than three times a week    Frequency of Social Gatherings with Friends and Family: More than three times a week    Attends Religious Services: More than 4 times per year    Active Member of [employer] or Organizations: Yes    Attends Banker Meetings: 1 to 4 times per year    Marital Status: Married    Tobacco Counseling Counseling given: Not Answered    Clinical Intake:  Pre-visit preparation completed: Yes  Pain : No/denies pain     BMI - recorded: 22.11 Nutritional Status: BMI of 19-24  Normal Nutritional Risks: None Diabetes: No  Lab Results  Component Value Date   HGBA1C 5.4 01/29/2024   HGBA1C 5.2 07/08/2023   HGBA1C 5.5 07/18/2022     How often do you need to have someone help you when you read instructions, pamphlets, or other written materials from your doctor or pharmacy?: 1 - Never  Interpreter Needed?: No  Information entered by :: Lamont Pilsner, LPN   Activities of Daily Living     04/18/2024   11:31 AM  In your present state of health, do you have any difficulty performing the following activities:  Hearing? 0  Vision? 0   Difficulty concentrating or making decisions? 0  Walking or climbing stairs? 0  Dressing or bathing? 0  Doing errands, shopping? 0  Preparing Food and eating ? N  Using the Toilet? N  In the past six months, have you accidently leaked urine? N  Do you have problems with loss of bowel control? N  Managing your Medications? N  Managing your Finances? N  Housekeeping or managing your Housekeeping? N    Patient Care Team: Almira Jaeger, MD as PCP - General (Family Medicine) Sheryle Donning, MD as PCP - Cardiology (Cardiology) Elna Haggis, MD as Consulting Physician (Neurosurgery) Lillian Rein, MD as Consulting Physician (Gynecology) Thais Fill, MD as Consulting Physician (Dermatology)  Indicate any recent Medical Services you may have received from other than [facility] providers in the past year (date may be approximate).     Assessment:   This is a routine wellness examination for Zinaya.  Hearing/Vision screen Hearing Screening - Comments:: Pt denies any hearing issues  Vision Screening - Comments:: Wears rx glasses - up to date with routine eye exams with Dr Terrall Ferraris     Goals Addressed             This Visit's Progress    Patient Stated       Maintain health and activity        Depression Screen     04/18/2024   11:33 AM 02/25/2024    2:01 PM 09/08/2023    1:36 PM 07/22/2023    8:02 AM 05/29/2023    8:27 AM 04/13/2023   10:12 AM 02/19/2023    8:22 AM  PHQ 2/9 Scores  PHQ - 2 Score 0 0 0 0 0 0 0  PHQ- 9 Score    0 0 0 0  Fall Risk     04/18/2024   11:34 AM 02/25/2024    2:01 PM 09/08/2023    1:36 PM 07/22/2023    8:01 AM 04/13/2023   10:14 AM  Fall Risk   Falls in the past year? 0 0 0 0 0  Number falls in past yr: 0 0 0 0 0  Injury with Fall? 0 0 0 0 0  Risk for fall due to : No Fall Risks  No Fall Risks No Fall Risks Impaired vision  Follow up Falls prevention discussed  Falls evaluation completed Falls evaluation completed Falls prevention  discussed    MEDICARE RISK AT HOME:  Medicare Risk at Home Any stairs in or around the home?: Yes If so, are there any without handrails?: No Home free of loose throw rugs in walkways, pet beds, electrical cords, etc?: Yes Adequate lighting in your home to reduce risk of falls?: Yes Life alert?: No Use of a cane, walker or w/c?: No Grab bars in the bathroom?: No Shower chair or bench in shower?: No Elevated toilet seat or a handicapped toilet?: No  TIMED UP AND GO:  Was the test performed?  No  Cognitive Function: 6CIT completed        04/18/2024   11:34 AM 04/13/2023   10:15 AM 09/05/2022    8:00 AM  6CIT Screen  What Year? 0 points 0 points 0 points  What month? 0 points 0 points 0 points  What time? 0 points 0 points 0 points  Count back from 20 0 points 0 points 0 points  Months in reverse 0 points 0 points 0 points  Repeat phrase 0 points 0 points 0 points  Total Score 0 points 0 points 0 points    Immunizations Immunization History  Administered Date(s) Administered   Influenza Split 10/14/2012   Influenza Whole 10/26/2009   Influenza,inj,Quad PF,6+ Mos 08/29/2013, 09/29/2017, 09/28/2018   Influenza-Unspecified 09/21/2014, 09/05/2015, 09/02/2016   Td 11/21/2006   Tdap 10/23/2015   Zoster Recombinant(Shingrix ) 04/21/2018, 06/30/2018   Zoster, Live 09/22/2016    Screening Tests Health Maintenance  Topic Date Due   INFLUENZA VACCINE  07/22/2024   MAMMOGRAM  11/08/2024   Medicare Annual Wellness (AWV)  04/18/2025   DTaP/Tdap/Td (3 - Td or Tdap) 10/22/2025   Colonoscopy  09/03/2026   DEXA SCAN  01/19/2027   Hepatitis C Screening  Completed   Zoster Vaccines- Shingrix   Completed   HPV VACCINES  Aged Out   Meningococcal B Vaccine  Aged Out   Pneumonia Vaccine 83+ Years old  Discontinued   COVID-19 Vaccine  Discontinued    Health Maintenance  There are no preventive care reminders to display for this patient.  Health Maintenance Items Addressed: See  Nurse Notes  Additional Screening:  Vision Screening: Recommended annual ophthalmology exams for early detection of glaucoma and other disorders of the eye.  Dental Screening: Recommended annual dental exams for proper oral hygiene  Community Resource Referral / Chronic Care Management: CRR required this visit?  No   CCM required this visit?  No     Plan:     I have personally reviewed and noted the following in the patient's chart:   Medical and social history Use of alcohol, tobacco or illicit drugs  Current medications and supplements including opioid prescriptions. Patient is not currently taking opioid prescriptions. Functional ability and status Nutritional status Physical activity Advanced directives List of other physicians Hospitalizations, surgeries, and ER visits in previous 12 months Vitals Screenings  to include cognitive, depression, and falls Referrals and appointments  In addition, I have reviewed and discussed with patient certain preventive protocols, quality metrics, and best practice recommendations. A written personalized care plan for preventive services as well as general preventive health recommendations were provided to patient.     Bruno Capri, LPN   1/61/0960   After Visit Summary: (MyChart) Due to this being a telephonic visit, the after visit summary with patients personalized plan was offered to patient via MyChart   Notes: Nothing significant to report at this time.

## 2024-04-21 DIAGNOSIS — L658 Other specified nonscarring hair loss: Secondary | ICD-10-CM | POA: Diagnosis not present

## 2024-04-21 DIAGNOSIS — L661 Lichen planopilaris, unspecified: Secondary | ICD-10-CM | POA: Diagnosis not present

## 2024-05-03 NOTE — Therapy (Unsigned)
 OUTPATIENT PHYSICAL THERAPY FEMALE PELVIC EVALUATION   Patient Name: Cheyenne Myers MRN: [id number] DOB:1956/01/23, 68 y.o., female Today's Date: 05/04/2024  END OF SESSION:  PT End of Session - 05/04/24 1013     Visit Number 1    Date for PT Re-Evaluation 07/27/24    Authorization Type [employer]    Authorization - Visit Number 1    Authorization - Number of Visits 10    PT Start Time 1015    PT Stop Time 1055    PT Time Calculation (min) 40 min    Activity Tolerance Patient tolerated treatment well    Behavior During Therapy [facility] for tasks assessed/performed             Past Medical History:  Diagnosis Date   Abnormal Pap smear 06/2004   ASCUS, + HPV, CIN II LEEP   Arthritis    Cataract    Cervical radicular pain    managed by [facility]   Epstein Barr infection    Glaucoma    Heart murmur    Hx of adenomatous polyp of colon 02/13/2016   Hyperlipemia    MITRAL VALVE PROLAPSE 08/05/2007   Qualifier: Diagnosis of  By: Foy Imam CMA, Cree.Cornelia     Past Surgical History:  Procedure Laterality Date   CARPAL TUNNEL RELEASE Right 08/2011   CATARACT EXTRACTION Bilateral    CERVICAL BIOPSY  W/ LOOP ELECTRODE EXCISION  06/2004   CIN II   COLONOSCOPY     COLPOSCOPY     WISDOM TOOTH EXTRACTION  age 36   Patient Active Problem List   Diagnosis Date Noted   Vitamin D  deficiency 07/18/2022   Osteopenia of left femoral neck 10/29/2019   Glaucoma 07/28/2018   Hx of adenomatous polyp of colon 02/13/2016   Stenosis of cervical spine 07/18/2014   H/O cold sores 07/18/2014   Cervical spondylosis without myelopathy 01/25/2014   Hyperlipidemia 08/05/2007    PCP: Almira Jaeger, MD  REFERRING PROVIDER: Lillian Rein, MD   REFERRING DIAG: N39.3 (ICD-10-CM) - SUI (stress urinary incontinence, female)   THERAPY DIAG:  Muscle weakness (generalized)  Other lack of coordination  Rationale for Evaluation and Treatment: Rehabilitation  ONSET DATE: 2005  SUBJECTIVE:                                                                                                                                                                                            SUBJECTIVE STATEMENT: Patient reports her urinary leakage is getting worse. I am using the estradiol  patch and progesterone . I just got the estrogen cream.  Fluid intake: water  PAIN:  Are you having pain? No  PRECAUTIONS: None  RED FLAGS: None   WEIGHT BEARING RESTRICTIONS: No  FALLS:  Has patient fallen in last 6 months? No  OCCUPATION: retired  ACTIVITY LEVEL : walking, bands and weights  PLOF: Independent  PATIENT GOALS: reduce her leakage and pressure to urinate  PERTINENT HISTORY:  osteopenia  BOWEL MOVEMENT: no issues  URINATION: Pain with urination: No Fully empty bladder: Yes:   Stream: Strong Urgency: No, pressure makes her feel like she has to urinate Frequency: goes in the morning more frequently compared to the afternoon Leakage: Coughing, Sneezing, and standing and playing with grandkids Pads: Yes: when she has  a cold  INTERCOURSE:not active  Ability to have vaginal penetration No  Pain with intercourse: Initial Penetration, During Penetration, and Deep Penetration DrynessYes  Climax: not able to climax Marinoff Scale: 3/3   PREGNANCY: Vaginal deliveries 2 Tearing No Episiotomy Yes   PROLAPSE: Pressure   OBJECTIVE:  Note: Objective measures were completed at Evaluation unless otherwise noted.  DIAGNOSTIC FINDINGS:  none  PATIENT SURVEYS:  PFIQ-7: 17 UIQ-7; 19  COGNITION: Overall cognitive status: Within functional limits for tasks assessed     SENSATION: Light touch: Appears intact   POSTURE: No Significant postural limitations   LUMBARAROM/PROM: Lumbar ROM is full   LOWER EXTREMITY ROM: Bilateral hip ROM is full   LOWER EXTREMITY MMT: bilateral hip strength 5/5  PALPATION:   Pelvic Alignment: ASIS are equal.   Abdominal: contract the  upper abdominals and slightly bulge the lower; difficulty with diaphragmatic breathing                External Perineal Exam: clitoral hood adhesions, dark area along the left inner labia minora area, white area along the upper midline of the vulva area                             Internal Pelvic Floor: dryness located internally and externally, weak contraction of the introitus  Patient confirms identification and approves PT to assess internal pelvic floor and treatment Yes No emotional/communication barriers or cognitive limitation. Patient is motivated to learn. Patient understands and agrees with treatment goals and plan. PT explains patient will be examined in standing, sitting, and lying down to see how their muscles and joints work. When they are ready, they will be asked to remove their underwear so PT can examine their perineum. The patient is also given the option of providing their own chaperone as one is not provided in our facility. The patient also has the right and is explained the right to defer or refuse any part of the evaluation or treatment including the internal exam. With the patient's consent, PT will use one gloved finger to gently assess the muscles of the pelvic floor, seeing how well it contracts and relaxes and if there is muscle symmetry. After, the patient will get dressed and PT and patient will discuss exam findings and plan of care. PT and patient discuss plan of care, schedule, attendance policy and HEP activities.   PELVIC MMT: vaginal strength in supine 2/5 and standing is 2/5.           TONE: average  PROLAPSE: Slight anterior wall weakness.   TODAY'S TREATMENT:  DATE: 05/04/24  EVAL Examination completed, findings reviewed, pt educated on POC, HEP, and female pelvic floor anatomy, reasoning with pelvic floor assessment internally with  pt consent. Pt motivated to participate in PT and agreeable to attempt recommendations.     PATIENT EDUCATION:  05/04/24 Education details: Access Code: [email or web address], educated patient on vaginal moisturizers and how to apply to the vulva area and educated on how to apply the vaginal estrogen to the vaginal canal Person educated: Patient Education method: Explanation, Demonstration, Tactile cues, Verbal cues, and Handouts Education comprehension: verbalized understanding, returned demonstration, verbal cues required, tactile cues required, and needs further education  HOME EXERCISE PROGRAM: 05/04/24 Access Code: [email or web address] URL: [email or web address] Date: 05/04/2024 Prepared by: Marsha Skeen  Exercises - Supine Pelvic Floor Contraction  - 3 x daily - 7 x weekly - 1 sets - 10 reps  ASSESSMENT:  CLINICAL IMPRESSION: Patient is a 68 y.o. female  who was seen today for physical therapy evaluation and treatment for stress urinary incontinence. Patient urinary leakage has been progressively worse since 2005. She will leak with coughing, sneezing, and standing and playing with grandchildren. She urinates more frequently in the morning compared  to later in the day. She gets a frequent pressure feeling in the vaginal area and will urinate. Pelvic floor strength in supine and standing is 2/5. She has not had penile penetration for years due to the dryness of the vaginal area and the pain she would experience. Her vulva is showing signs of menopause with drynesss, clitoral hood adhesions, and lack of color. Patient will benefit from skilled therapy to improve pelvic floor coordination and strength to work on reducing her urinary leakage.   OBJECTIVE IMPAIRMENTS: decreased coordination, decreased endurance, and decreased strength.   ACTIVITY LIMITATIONS: continence  PARTICIPATION LIMITATIONS: interpersonal relationship, shopping, and community activity  PERSONAL FACTORS: Time since onset of  injury/illness/exacerbation are also affecting patient's functional outcome.   REHAB POTENTIAL: Excellent  CLINICAL DECISION MAKING: Evolving/moderate complexity  EVALUATION COMPLEXITY: Moderate   GOALS: Goals reviewed with patient? Yes  SHORT TERM GOALS: Target date: 06/01/24  Patient educated on use of vaginal moisturizers to improve vulva health.  Baseline: Goal status: INITIAL  2.  Patient is independent with initial HEP for pelvic floor and abdominal contraction to improve urinary leakage.  Baseline:  Goal status: INITIAL  3.  Patient is able to perform diaphragmatic breathing to bulge the pelvic floor and move the tissue through the full range.  Baseline:  Goal status: INITIAL   LONG TERM GOALS: Target date: 07/27/24  Patient independent with advanced HEP for pelvic floor strength and coordination to reduce urinary leakage.  Baseline:  Goal status: INITIAL  2.  Patient is able to quickly contract and relax her pelvic floor in 10 seconds 10 x to reduce urinary leakage with coughing and sneezing.  Baseline:  Goal status: INITIAL  3.  Patient pelvic floor strength >/= 3/5 so she is able to quickly move while playing with her grandchildren and not leak urine.  Baseline:  Goal status: INITIAL  4.  Patient reports her vaginal pressure feeling has decreased >/= 80% due to increased in pelvic floor strength >/= 3/5.  Baseline:  Goal status: INITIAL   PLAN:  PT FREQUENCY: 1x/week  PT DURATION: 12 weeks  PLANNED INTERVENTIONS: 97110-Therapeutic exercises, 97530- Therapeutic activity, [id number]- Neuromuscular re-education, 97535- Self Care, [id number]- Manual therapy, Patient/Family education, Dry Needling, and Biofeedback  PLAN FOR NEXT SESSION: work on lower abdominal contraction, use the RUSI for her to  see the pelvic floor contraction, review vaginal moisturizers, urge to void   Marsha Skeen, PT 05/04/24 1:22 PM

## 2024-05-04 ENCOUNTER — Ambulatory Visit: Attending: Obstetrics & Gynecology | Admitting: Physical Therapy

## 2024-05-04 ENCOUNTER — Encounter: Payer: Self-pay | Admitting: Physical Therapy

## 2024-05-04 ENCOUNTER — Other Ambulatory Visit: Payer: Self-pay

## 2024-05-04 DIAGNOSIS — N393 Stress incontinence (female) (male): Secondary | ICD-10-CM | POA: Diagnosis not present

## 2024-05-04 DIAGNOSIS — R278 Other lack of coordination: Secondary | ICD-10-CM | POA: Insufficient documentation

## 2024-05-04 DIAGNOSIS — M6281 Muscle weakness (generalized): Secondary | ICD-10-CM | POA: Insufficient documentation

## 2024-05-04 NOTE — Patient Instructions (Signed)
 Moisturizers They are used in the vagina to hydrate the mucous membrane that make up the vaginal canal. Designed to keep a more normal acid balance (ph) Ingredients to avoid is glycerin and fragrance, can increase chance of infection     Creams to use externally on the Vulva area Vulva Balm/ V-magic cream by medicine mama- amazon Julva-amazon Vital "V Wild Yam salve ( help moisturize and help with thinning vulvar area, does have Beeswax [employer] Pro-Meno Wild Yam Cream- [employer] Desert Harvest Gele Cleo by Derenda Flax labial moisturizer (Amazon),  aloe Good Clean Love Enchanted Rose by intimate rose  Things to avoid in the vaginal area Do not use things to irritate the vulvar area No lotions just specialized creams for the vulva area- Neogyn, V-magic,  No soaps; can use Aveeno or Calendula cleanser, unscented Dove if needed. Must be gentle No deodorants No douches Good to sleep without underwear to let the vaginal area to air out No scrubbing: spread the lips to let warm water rinse over labias and pat dry   Use estrogen cream daily for 2  weeks then 2 times per week afterwards.    [facility] [address], [address] [place], [place] [address] Phone # [phone] Fax [phone]'

## 2024-05-20 ENCOUNTER — Ambulatory Visit: Admitting: Physical Therapy

## 2024-05-20 ENCOUNTER — Encounter: Payer: Self-pay | Admitting: Physical Therapy

## 2024-05-20 DIAGNOSIS — M6281 Muscle weakness (generalized): Secondary | ICD-10-CM | POA: Diagnosis not present

## 2024-05-20 DIAGNOSIS — R278 Other lack of coordination: Secondary | ICD-10-CM

## 2024-05-20 NOTE — Patient Instructions (Addendum)
 Urge Incontinence  Ideal urination frequency is every 2-4 wakeful hours, which equates to 5-8 times within a 24-hour period.   Urge incontinence is leakage that occurs when the bladder muscle contracts, creating a sudden need to go before getting to the bathroom.   Going too often when your bladder isn't actually full can disrupt the body's automatic signals to store and hold urine longer, which will increase urgency/frequency.  In this case, the bladder "is running the show" and strategies can be learned to retrain this pattern.   One should be able to control the first urge to urinate, at around .  The bladder can hold up to a "grande latte," or . To help you gain control, practice the Urge Drill below when urgency strikes.  This drill will help retrain your bladder signals and allow you to store and hold urine longer.  The overall goal is to stretch out your time between voids to reach a more manageable voiding schedule.    Practice your "quick flicks" often throughout the day (each waking hour) even when you don't need feel the urge to go.  This will help strengthen your pelvic floor muscles, making them more effective in controlling leakage.  Urge Drill  When you feel an urge to go, follow these steps to regain control: Stop what you are doing and be still Take one deep breath, directing your air into your abdomen Think an affirming thought, such as "I've got this." Do 5 quick flicks of your pelvic floor 5 Heel raises with hitting heels hard on floor Walk with control to the bathroom to void, or delay voiding If you get the urge again do the whole process again  [facility] [address], [address] [place], [place] [address] Phone # [phone] Fax [phone]

## 2024-05-20 NOTE — Therapy (Signed)
 OUTPATIENT PHYSICAL THERAPY FEMALE PELVIC TREATMENT   Patient Name: Cheyenne Myers MRN: [id number] DOB:[birth year], 68 y.o., female Today's Date: 05/20/2024  END OF SESSION:  PT End of Session - 05/20/24 0801     Visit Number 2    Date for PT Re-Evaluation 07/27/24    Authorization Type [employer]    Authorization - Visit Number 2    Authorization - Number of Visits 10    PT Start Time 0800    PT Stop Time 0840    PT Time Calculation (min) 40 min    Activity Tolerance Patient tolerated treatment well    Behavior During Therapy [facility] for tasks assessed/performed             Past Medical History:  Diagnosis Date   Abnormal Pap smear 06/2004   ASCUS, + HPV, CIN II LEEP   Arthritis    Cataract    Cervical radicular pain    managed by [facility]   Brigido Canales infection    Glaucoma    Heart murmur    Hx of adenomatous polyp of colon 02/13/2016   Hyperlipemia    MITRAL VALVE PROLAPSE 08/05/2007   Qualifier: Diagnosis of  By: Foy Imam CMA, Cree.Cornelia     Past Surgical History:  Procedure Laterality Date   CARPAL TUNNEL RELEASE Right 08/2011   CATARACT EXTRACTION Bilateral    CERVICAL BIOPSY  W/ LOOP ELECTRODE EXCISION  06/2004   CIN II   COLONOSCOPY     COLPOSCOPY     WISDOM TOOTH EXTRACTION  age 55   Patient Active Problem List   Diagnosis Date Noted   Vitamin D  deficiency 07/18/2022   Osteopenia of left femoral neck 10/29/2019   Glaucoma 07/28/2018   Hx of adenomatous polyp of colon 02/13/2016   Stenosis of cervical spine 07/18/2014   H/O cold sores 07/18/2014   Cervical spondylosis without myelopathy 01/25/2014   Hyperlipidemia 08/05/2007    PCP: Almira Jaeger, MD  REFERRING PROVIDER: Lillian Rein, MD   REFERRING DIAG: N39.3 (ICD-10-CM) - SUI (stress urinary incontinence, female)   THERAPY DIAG:  Muscle weakness (generalized)  Other lack of coordination  Rationale for Evaluation and Treatment: Rehabilitation  ONSET DATE: 2005  SUBJECTIVE:                                                                                                                                                                                            SUBJECTIVE STATEMENT: I have not had issues with leakage since last visit.  I have been doing the vaginal estrogen for 2 weeks. I feel the pressure feeling first thing in  the morning.  Fluid intake: water  PAIN:  Are you having pain? No  PRECAUTIONS: None  RED FLAGS: None   WEIGHT BEARING RESTRICTIONS: No  FALLS:  Has patient fallen in last 6 months? No  OCCUPATION: retired  ACTIVITY LEVEL : walking, bands and weights  PLOF: Independent  PATIENT GOALS: reduce her leakage and pressure to urinate  PERTINENT HISTORY:  osteopenia  BOWEL MOVEMENT: no issues  URINATION: Pain with urination: No Fully empty bladder: Yes:   Stream: Strong Urgency: No, pressure makes her feel like she has to urinate Frequency: goes in the morning more frequently compared to the afternoon Leakage: Coughing, Sneezing, and standing and playing with grandkids Pads: Yes: when she has  a cold  INTERCOURSE:not active  Ability to have vaginal penetration No  Pain with intercourse: Initial Penetration, During Penetration, and Deep Penetration DrynessYes  Climax: not able to climax Marinoff Scale: 3/3   PREGNANCY: Vaginal deliveries 2 Tearing No Episiotomy Yes   PROLAPSE: Pressure   OBJECTIVE:  Note: Objective measures were completed at Evaluation unless otherwise noted.  DIAGNOSTIC FINDINGS:  none  PATIENT SURVEYS:  PFIQ-7: 17 UIQ-7; 19  COGNITION: Overall cognitive status: Within functional limits for tasks assessed     SENSATION: Light touch: Appears intact   POSTURE: No Significant postural limitations   LUMBARAROM/PROM: Lumbar ROM is full   LOWER EXTREMITY ROM: Bilateral hip ROM is full   LOWER EXTREMITY MMT: bilateral hip strength 5/5  PALPATION:   Pelvic Alignment: ASIS are equal.    Abdominal: contract the upper abdominals and slightly bulge the lower; difficulty with diaphragmatic breathing                External Perineal Exam: clitoral hood adhesions, dark area along the left inner labia minora area, white area along the upper midline of the vulva area                             Internal Pelvic Floor: dryness located internally and externally, weak contraction of the introitus  Patient confirms identification and approves PT to assess internal pelvic floor and treatment Yes No emotional/communication barriers or cognitive limitation. Patient is motivated to learn. Patient understands and agrees with treatment goals and plan. PT explains patient will be examined in standing, sitting, and lying down to see how their muscles and joints work. When they are ready, they will be asked to remove their underwear so PT can examine their perineum. The patient is also given the option of providing their own chaperone as one is not provided in our facility. The patient also has the right and is explained the right to defer or refuse any part of the evaluation or treatment including the internal exam. With the patient's consent, PT will use one gloved finger to gently assess the muscles of the pelvic floor, seeing how well it contracts and relaxes and if there is muscle symmetry. After, the patient will get dressed and PT and patient will discuss exam findings and plan of care. PT and patient discuss plan of care, schedule, attendance policy and HEP activities.   PELVIC MMT: vaginal strength in supine 2/5 and standing is 2/5.           TONE: average  PROLAPSE: Slight anterior wall weakness.   TODAY'S TREATMENT:   05/20/24 Manual: Soft tissue mobilization: Soft tissue work to the diaphragm Myofascial release: Fascial release around the area of the ureters, bladder to go  through the restrictions Tissue rolling of the lower rib cage Neuromuscular re-education: Down  training: Educated patient on urge to void behavioral technique to deter the urge to void in the morning Diaphragmatic breathing in multiple positions to elongate the lower rib cage with foam roll under thoracic spine in supine, and laying on side with form roll under the lower rib cage Childs pose with diaphragmatic breathing then in supine                                                                                                                               DATE: 05/04/24  EVAL Examination completed, findings reviewed, pt educated on POC, HEP, and female pelvic floor anatomy, reasoning with pelvic floor assessment internally with pt consent. Pt motivated to participate in PT and agreeable to attempt recommendations.     PATIENT EDUCATION:  05/20/24 Education details: Access Code: [email or web address], educated patient on vaginal moisturizers and how to apply to the vulva area and educated on how to apply the vaginal estrogen to the vaginal canal Person educated: Patient Education method: Explanation, Demonstration, Tactile cues, Verbal cues, and Handouts Education comprehension: verbalized understanding, returned demonstration, verbal cues required, tactile cues required, and needs further education  HOME EXERCISE PROGRAM: 05/20/24 Access Code: [email or web address] URL: [email or web address] Date: 05/20/2024 Prepared by: Marsha Skeen  Exercises - Supine Diaphragmatic Breathing  - 1 x daily - 7 x weekly - 1 sets - 10 reps - Seated Diaphragmatic Breathing  - 1 x daily - 7 x weekly - 1 sets - 10 reps - Seated Pelvic Floor Contraction  - 3 x daily - 7 x weekly - 1 sets - 10 reps  ASSESSMENT:  CLINICAL IMPRESSION: Patient is a 68 y.o. female  who was seen today for physical therapy  treatment for stress urinary incontinence. Patient has not had any urinary leakage since last visit. She has increased vaginal pressure that make her feel like she has to urinate multiple times in the morning. She  was able to perform diaphragmatic breathing by the end of the treatment. She came in with vaginal pressure and left without it.  Patient will benefit from skilled therapy to improve pelvic floor coordination and strength to work on reducing her urinary leakage.   OBJECTIVE IMPAIRMENTS: decreased coordination, decreased endurance, and decreased strength.   ACTIVITY LIMITATIONS: continence  PARTICIPATION LIMITATIONS: interpersonal relationship, shopping, and community activity  PERSONAL FACTORS: Time since onset of injury/illness/exacerbation are also affecting patient's functional outcome.   REHAB POTENTIAL: Excellent  CLINICAL DECISION MAKING: Evolving/moderate complexity  EVALUATION COMPLEXITY: Moderate   GOALS: Goals reviewed with patient? Yes  SHORT TERM GOALS: Target date: 06/01/24  Patient educated on use of vaginal moisturizers to improve vulva health.  Baseline: Goal status: INITIAL  2.  Patient is independent with initial HEP for pelvic floor and abdominal contraction to improve urinary leakage.  Baseline:  Goal status: INITIAL  3.  Patient is able to perform diaphragmatic breathing to  bulge the pelvic floor and move the tissue through the full range.  Baseline:  Goal status: Met 05/21/23   LONG TERM GOALS: Target date: 07/27/24  Patient independent with advanced HEP for pelvic floor strength and coordination to reduce urinary leakage.  Baseline:  Goal status: INITIAL  2.  Patient is able to quickly contract and relax her pelvic floor in 10 seconds 10 x to reduce urinary leakage with coughing and sneezing.  Baseline:  Goal status: INITIAL  3.  Patient pelvic floor strength >/= 3/5 so she is able to quickly move while playing with her grandchildren and not leak urine.  Baseline:  Goal status: INITIAL  4.  Patient reports her vaginal pressure feeling has decreased >/= 80% due to increased in pelvic floor strength >/= 3/5.  Baseline:  Goal status:  INITIAL   PLAN:  PT FREQUENCY: 1x/week  PT DURATION: 12 weeks  PLANNED INTERVENTIONS: 97110-Therapeutic exercises, 97530- Therapeutic activity, 97112- Neuromuscular re-education, 97535- Self Care, 98119- Manual therapy, Patient/Family education, Dry Needling, and Biofeedback  PLAN FOR NEXT SESSION: work on lower abdominal contraction, use the RUSI for her to see the pelvic floor contraction, review vaginal moisturizers, urge to void   Marsha Skeen, PT 05/20/24 8:47 AM

## 2024-05-25 DIAGNOSIS — H401131 Primary open-angle glaucoma, bilateral, mild stage: Secondary | ICD-10-CM | POA: Diagnosis not present

## 2024-06-22 ENCOUNTER — Ambulatory Visit: Payer: Self-pay | Attending: Obstetrics & Gynecology | Admitting: Physical Therapy

## 2024-06-22 ENCOUNTER — Encounter: Payer: Self-pay | Admitting: Physical Therapy

## 2024-06-22 DIAGNOSIS — R278 Other lack of coordination: Secondary | ICD-10-CM | POA: Diagnosis not present

## 2024-06-22 DIAGNOSIS — M6281 Muscle weakness (generalized): Secondary | ICD-10-CM | POA: Diagnosis not present

## 2024-06-22 NOTE — Therapy (Signed)
 OUTPATIENT PHYSICAL THERAPY FEMALE PELVIC TREATMENT   Patient Name: Cheyenne Myers MRN: [id number] DOB:1956/06/18, 68 y.o., female Today's Date: 06/22/2024  END OF SESSION:  PT End of Session - 06/22/24 1101     Visit Number 3    Date for PT Re-Evaluation 07/27/24    Authorization Type [employer]    Authorization - Visit Number 3    Authorization - Number of Visits 10    PT Start Time 1100    PT Stop Time 1140    PT Time Calculation (min) 40 min    Activity Tolerance Patient tolerated treatment well    Behavior During Therapy WFL for tasks assessed/performed          Past Medical History:  Diagnosis Date   Abnormal Pap smear 06/2004   ASCUS, + HPV, CIN II LEEP   Arthritis    Cataract    Cervical radicular pain    managed by [facility]   Epstein Barr infection    Glaucoma    Heart murmur    Hx of adenomatous polyp of colon 02/13/2016   Hyperlipemia    MITRAL VALVE PROLAPSE 08/05/2007   Qualifier: Diagnosis of  By: Sherron CMA, Cree.Cornelia     Past Surgical History:  Procedure Laterality Date   CARPAL TUNNEL RELEASE Right 08/2011   CATARACT EXTRACTION Bilateral    CERVICAL BIOPSY  W/ LOOP ELECTRODE EXCISION  06/2004   CIN II   COLONOSCOPY     COLPOSCOPY     WISDOM TOOTH EXTRACTION  age 81   Patient Active Problem List   Diagnosis Date Noted   Vitamin D  deficiency 07/18/2022   Osteopenia of left femoral neck 10/29/2019   Glaucoma 07/28/2018   Hx of adenomatous polyp of colon 02/13/2016   Stenosis of cervical spine 07/18/2014   H/O cold sores 07/18/2014   Cervical spondylosis without myelopathy 01/25/2014   Hyperlipidemia 08/05/2007    PCP: Katrinka Garnette KIDD, MD  REFERRING PROVIDER: Cleotilde Ronal RAMAN, MD   REFERRING DIAG: N39.3 (ICD-10-CM) - SUI (stress urinary incontinence, female)   THERAPY DIAG:  Muscle weakness (generalized)  Other lack of coordination  Rationale for Evaluation and Treatment: Rehabilitation  ONSET DATE: 2005  SUBJECTIVE:                                                                                                                                                                                            SUBJECTIVE STATEMENT: I feel like I have to go to the bathroom often.  Fluid intake: water  PAIN:  Are you having pain? No  PRECAUTIONS: None  RED FLAGS: None  WEIGHT BEARING RESTRICTIONS: No  FALLS:  Has patient fallen in last 6 months? No  OCCUPATION: retired  ACTIVITY LEVEL : walking, bands and weights  PLOF: Independent  PATIENT GOALS: reduce her leakage and pressure to urinate  PERTINENT HISTORY:  osteopenia  BOWEL MOVEMENT: no issues  URINATION: Pain with urination: No Fully empty bladder: Yes:   Stream: Strong Urgency: No, pressure makes her feel like she has to urinate Frequency: goes in the morning more frequently compared to the afternoon Leakage: Coughing, Sneezing, and standing and playing with grandkids Pads: Yes: when she has  a cold  INTERCOURSE:not active  Ability to have vaginal penetration No  Pain with intercourse: Initial Penetration, During Penetration, and Deep Penetration DrynessYes  Climax: not able to climax Marinoff Scale: 3/3   PREGNANCY: Vaginal deliveries 2 Tearing No Episiotomy Yes   PROLAPSE: Pressure   OBJECTIVE:  Note: Objective measures were completed at Evaluation unless otherwise noted.  DIAGNOSTIC FINDINGS:  none  PATIENT SURVEYS:  PFIQ-7: 17 UIQ-7; 19  COGNITION: Overall cognitive status: Within functional limits for tasks assessed     SENSATION: Light touch: Appears intact   POSTURE: No Significant postural limitations   LUMBARAROM/PROM: Lumbar ROM is full   LOWER EXTREMITY ROM: Bilateral hip ROM is full   LOWER EXTREMITY MMT: bilateral hip strength 5/5  PALPATION:   Pelvic Alignment: ASIS are equal.   Abdominal: contract the upper abdominals and slightly bulge the lower; difficulty with diaphragmatic breathing                 External Perineal Exam: clitoral hood adhesions, dark area along the left inner labia minora area, white area along the upper midline of the vulva area                             Internal Pelvic Floor: dryness located internally and externally, weak contraction of the introitus  Patient confirms identification and approves PT to assess internal pelvic floor and treatment Yes No emotional/communication barriers or cognitive limitation. Patient is motivated to learn. Patient understands and agrees with treatment goals and plan. PT explains patient will be examined in standing, sitting, and lying down to see how their muscles and joints work. When they are ready, they will be asked to remove their underwear so PT can examine their perineum. The patient is also given the option of providing their own chaperone as one is not provided in our facility. The patient also has the right and is explained the right to defer or refuse any part of the evaluation or treatment including the internal exam. With the patient's consent, PT will use one gloved finger to gently assess the muscles of the pelvic floor, seeing how well it contracts and relaxes and if there is muscle symmetry. After, the patient will get dressed and PT and patient will discuss exam findings and plan of care. PT and patient discuss plan of care, schedule, attendance policy and HEP activities.   PELVIC MMT: vaginal strength in supine 2/5 and standing is 2/5.  06/22/24: 3/5 with weak lift          TONE: average  PROLAPSE: Slight anterior wall weakness.   TODAY'S TREATMENT:   06/22/24 Manual: Soft tissue mobilization: Manual work to the hip adductors to lengthen Manual work to the outside of the perineum to the urogenital diaphragm and ischiocavernosus Internal pelvic floor techniques: No emotional/communication barriers or cognitive limitation. Patient is motivated to learn.  Patient understands and agrees with treatment goals and  plan. PT explains patient will be examined in standing, sitting, and lying down to see how their muscles and joints work. When they are ready, they will be asked to remove their underwear so PT can examine their perineum. The patient is also given the option of providing their own chaperone as one is not provided in our facility. The patient also has the right and is explained the right to defer or refuse any part of the evaluation or treatment including the internal exam. With the patient's consent, PT will use one gloved finger to gently assess the muscles of the pelvic floor, seeing how well it contracts and relaxes and if there is muscle symmetry. After, the patient will get dressed and PT and patient will discuss exam findings and plan of care. PT and patient discuss plan of care, schedule, attendance policy and HEP activities.  Therapist gloved finger working on the introitus, levator ani and obturator internist to lengthen the muscles Therapist gloved finger performing fascial release around the urethra and bladder with another hand in the lower abdominal region going through the restrictions Self-care: Educated patient on how to massage the introitus to elongate the tissue     05/20/24 Manual: Soft tissue mobilization: Soft tissue work to the diaphragm Myofascial release: Fascial release around the area of the ureters, bladder to go through the restrictions Tissue rolling of the lower rib cage Neuromuscular re-education: Down training: Educated patient on urge to void behavioral technique to deter the urge to void in the morning Diaphragmatic breathing in multiple positions to elongate the lower rib cage with foam roll under thoracic spine in supine, and laying on side with form roll under the lower rib cage Childs pose with diaphragmatic breathing then in supine                                                                                                                                DATE: 05/04/24  EVAL Examination completed, findings reviewed, pt educated on POC, HEP, and female pelvic floor anatomy, reasoning with pelvic floor assessment internally with pt consent. Pt motivated to participate in PT and agreeable to attempt recommendations.     PATIENT EDUCATION:  06/22/24 Education details: Access Code: [email or web address], educated patient on how to massage the introitus and gave her uber lube to use; sent patient you tube video for perineal massage and pelvic floor stretches Person educated: Patient Education method: Explanation, Demonstration, Tactile cues, Verbal cues, and Handouts Education comprehension: verbalized understanding, returned demonstration, verbal cues required, tactile cues required, and needs further education  HOME EXERCISE PROGRAM: 05/20/24 Access Code: [email or web address] URL: [email or web address] Date: 05/20/2024 Prepared by: Channing Pereyra  Exercises - Supine Diaphragmatic Breathing  - 1 x daily - 7 x weekly - 1 sets - 10 reps - Seated Diaphragmatic Breathing  - 1 x daily - 7 x weekly - 1 sets - 10 reps - Seated  Pelvic Floor Contraction  - 3 x daily - 7 x weekly - 1 sets - 10 reps  ASSESSMENT:  CLINICAL IMPRESSION: Patient is a 68 y.o. female  who was seen today for physical therapy  treatment for stress urinary incontinence. Pelvic floor strength increased to 3/5 with weak lift. She has increased tension around the pelvic floor muscles and had some fascial restrictions around the urethra and bladder.   Patient pelvic floor muscles felt more relaxed after the manual work. She understands how the pelvic floor muscles that are tight can cause increased urgency. Patient will benefit from skilled therapy to improve pelvic floor coordination and strength to work on reducing her urinary leakage.   OBJECTIVE IMPAIRMENTS: decreased coordination, decreased endurance, and decreased strength.   ACTIVITY LIMITATIONS: continence  PARTICIPATION LIMITATIONS:  interpersonal relationship, shopping, and community activity  PERSONAL FACTORS: Time since onset of injury/illness/exacerbation are also affecting patient's functional outcome.   REHAB POTENTIAL: Excellent  CLINICAL DECISION MAKING: Evolving/moderate complexity  EVALUATION COMPLEXITY: Moderate   GOALS: Goals reviewed with patient? Yes  SHORT TERM GOALS: Target date: 06/01/24  Patient educated on use of vaginal moisturizers to improve vulva health.  Baseline: Goal status: Met 06/22/24  2.  Patient is independent with initial HEP for pelvic floor and abdominal contraction to improve urinary leakage.  Baseline:  Goal status: INITIAL  3.  Patient is able to perform diaphragmatic breathing to bulge the pelvic floor and move the tissue through the full range.  Baseline:  Goal status: Met 05/21/23   LONG TERM GOALS: Target date: 07/27/24  Patient independent with advanced HEP for pelvic floor strength and coordination to reduce urinary leakage.  Baseline:  Goal status: INITIAL  2.  Patient is able to quickly contract and relax her pelvic floor in 10 seconds 10 x to reduce urinary leakage with coughing and sneezing.  Baseline:  Goal status: INITIAL  3.  Patient pelvic floor strength >/= 3/5 so she is able to quickly move while playing with her grandchildren and not leak urine.  Baseline:  Goal status: INITIAL  4.  Patient reports her vaginal pressure feeling has decreased >/= 80% due to increased in pelvic floor strength >/= 3/5.  Baseline:  Goal status: INITIAL   PLAN:  PT FREQUENCY: 1x/week  PT DURATION: 12 weeks  PLANNED INTERVENTIONS: 97110-Therapeutic exercises, 97530- Therapeutic activity, 97112- Neuromuscular re-education, 97535- Self Care, [id number]- Manual therapy, Patient/Family education, Dry Needling, and Biofeedback  PLAN FOR NEXT SESSION: work on lower abdominal contraction, manual work to the pelvic floor, SI oint mobility   Channing Pereyra, PT 06/22/24 11:49  AM

## 2024-07-13 ENCOUNTER — Encounter (HOSPITAL_BASED_OUTPATIENT_CLINIC_OR_DEPARTMENT_OTHER): Payer: Self-pay | Admitting: Obstetrics & Gynecology

## 2024-07-13 ENCOUNTER — Encounter: Payer: Self-pay | Admitting: Physical Therapy

## 2024-07-13 ENCOUNTER — Ambulatory Visit: Admitting: Physical Therapy

## 2024-07-13 DIAGNOSIS — M6281 Muscle weakness (generalized): Secondary | ICD-10-CM | POA: Diagnosis not present

## 2024-07-13 DIAGNOSIS — R278 Other lack of coordination: Secondary | ICD-10-CM

## 2024-07-13 NOTE — Therapy (Signed)
 OUTPATIENT PHYSICAL THERAPY FEMALE PELVIC TREATMENT   Patient Name: Cheyenne Myers MRN: [id number] DOB:[birth year], 68 y.o., female Today's Date: 07/13/2024  END OF SESSION:  PT End of Session - 07/13/24 1019     Visit Number 4    Date for PT Re-Evaluation 07/27/24    Authorization Type [employer]    Authorization - Visit Number 4    Authorization - Number of Visits 10    PT Start Time 1015    PT Stop Time 1055    PT Time Calculation (min) 40 min    Activity Tolerance Patient tolerated treatment well    Behavior During Therapy [facility] for tasks assessed/performed          Past Medical History:  Diagnosis Date   Abnormal Pap smear 06/2004   ASCUS, + HPV, CIN II LEEP   Arthritis    Cataract    Cervical radicular pain    managed by [facility]   Epstein Barr infection    Glaucoma    Heart murmur    Hx of adenomatous polyp of colon 02/13/2016   Hyperlipemia    MITRAL VALVE PROLAPSE 08/05/2007   Qualifier: Diagnosis of  By: Sherron CMA, Cree.Cornelia     Past Surgical History:  Procedure Laterality Date   CARPAL TUNNEL RELEASE Right 08/2011   CATARACT EXTRACTION Bilateral    CERVICAL BIOPSY  W/ LOOP ELECTRODE EXCISION  06/2004   CIN II   COLONOSCOPY     COLPOSCOPY     WISDOM TOOTH EXTRACTION  age 68   Patient Active Problem List   Diagnosis Date Noted   Vitamin D  deficiency 07/18/2022   Osteopenia of left femoral neck 10/29/2019   Glaucoma 07/28/2018   Hx of adenomatous polyp of colon 02/13/2016   Stenosis of cervical spine 07/18/2014   H/O cold sores 07/18/2014   Cervical spondylosis without myelopathy 01/25/2014   Hyperlipidemia 08/05/2007    PCP: Katrinka Garnette KIDD, MD  REFERRING PROVIDER: Cleotilde Ronal RAMAN, MD   REFERRING DIAG: N39.3 (ICD-10-CM) - SUI (stress urinary incontinence, female)   THERAPY DIAG:  Muscle weakness (generalized)  Other lack of coordination  Rationale for Evaluation and Treatment: Rehabilitation  ONSET DATE: 2005  SUBJECTIVE:                                                                                                                                                                                            SUBJECTIVE STATEMENT: Last visit has helped a lot. I have been doing my exercises. I do have a little pressure. I can wait 2.5 hours.  Fluid intake: water  PAIN:  Are you  having pain? No  PRECAUTIONS: None  RED FLAGS: None   WEIGHT BEARING RESTRICTIONS: No  FALLS:  Has patient fallen in last 6 months? No  OCCUPATION: retired  ACTIVITY LEVEL : walking, bands and weights  PLOF: Independent  PATIENT GOALS: reduce her leakage and pressure to urinate  PERTINENT HISTORY:  osteopenia  BOWEL MOVEMENT: no issues  URINATION: Pain with urination: No Fully empty bladder: Yes:   Stream: Strong Urgency: No, pressure makes her feel like she has to urinate Frequency: goes in the morning more frequently compared to the afternoon Leakage: Coughing, Sneezing, and standing and playing with grandkids 07/13/24: no urinary leakage with playing with grandkids Pads: Yes: when she has  a cold  INTERCOURSE:not active  Ability to have vaginal penetration No  Pain with intercourse: Initial Penetration, During Penetration, and Deep Penetration DrynessYes  Climax: not able to climax Marinoff Scale: 3/3   PREGNANCY: Vaginal deliveries 2 Tearing No Episiotomy Yes   PROLAPSE: Pressure   OBJECTIVE:  Note: Objective measures were completed at Evaluation unless otherwise noted.  DIAGNOSTIC FINDINGS:  none  PATIENT SURVEYS:  PFIQ-7: 17 UIQ-7; 19  COGNITION: Overall cognitive status: Within functional limits for tasks assessed     SENSATION: Light touch: Appears intact   POSTURE: No Significant postural limitations   LUMBARAROM/PROM: Lumbar ROM is full   LOWER EXTREMITY ROM: Bilateral hip ROM is full   LOWER EXTREMITY MMT: bilateral hip strength 5/5  PALPATION:   Pelvic Alignment: ASIS are equal.    Abdominal: contract the upper abdominals and slightly bulge the lower; difficulty with diaphragmatic breathing                External Perineal Exam: clitoral hood adhesions, dark area along the left inner labia minora area, white area along the upper midline of the vulva area                             Internal Pelvic Floor: dryness located internally and externally, weak contraction of the introitus  Patient confirms identification and approves PT to assess internal pelvic floor and treatment Yes No emotional/communication barriers or cognitive limitation. Patient is motivated to learn. Patient understands and agrees with treatment goals and plan. PT explains patient will be examined in standing, sitting, and lying down to see how their muscles and joints work. When they are ready, they will be asked to remove their underwear so PT can examine their perineum. The patient is also given the option of providing their own chaperone as one is not provided in our facility. The patient also has the right and is explained the right to defer or refuse any part of the evaluation or treatment including the internal exam. With the patient's consent, PT will use one gloved finger to gently assess the muscles of the pelvic floor, seeing how well it contracts and relaxes and if there is muscle symmetry. After, the patient will get dressed and PT and patient will discuss exam findings and plan of care. PT and patient discuss plan of care, schedule, attendance policy and HEP activities.   PELVIC MMT: vaginal strength in supine 2/5 and standing is 2/5.  06/22/24: 3/5 with weak lift          TONE: average  PROLAPSE: Slight anterior wall weakness.   TODAY'S TREATMENT:   07/13/24  Neuromuscular re-education: Pelvic floor contraction training: Pelvic floor contraction holding 5 sec 10 x in sitting Sitting quick contraction 10  x in sitting Contracting pelvic floor in sitting with fake sneeze then did in  standing Side lunge with holding 5# wt in hand and reach to opposite leg 10 x each side with pelvic floor contraction Bicep curl with 5# in each hand going up on toes 10 x 2  Single leg stance pulling band sideways 10 x each leg both ways Single leg stance pulling band forward 10 x each leg Single leg stance pulling band backward 10 x each leg Leaning forward with space between the rib cage and pubic bone to reduce the pressure on the pelvic floor and with other tasks.   06/22/24 Manual: Soft tissue mobilization: Manual work to the hip adductors to lengthen Manual work to the outside of the perineum to the urogenital diaphragm and ischiocavernosus Internal pelvic floor techniques: No emotional/communication barriers or cognitive limitation. Patient is motivated to learn. Patient understands and agrees with treatment goals and plan. PT explains patient will be examined in standing, sitting, and lying down to see how their muscles and joints work. When they are ready, they will be asked to remove their underwear so PT can examine their perineum. The patient is also given the option of providing their own chaperone as one is not provided in our facility. The patient also has the right and is explained the right to defer or refuse any part of the evaluation or treatment including the internal exam. With the patient's consent, PT will use one gloved finger to gently assess the muscles of the pelvic floor, seeing how well it contracts and relaxes and if there is muscle symmetry. After, the patient will get dressed and PT and patient will discuss exam findings and plan of care. PT and patient discuss plan of care, schedule, attendance policy and HEP activities.  Therapist gloved finger working on the introitus, levator ani and obturator internist to lengthen the muscles Therapist gloved finger performing fascial release around the urethra and bladder with another hand in the lower abdominal region going  through the restrictions Self-care: Educated patient on how to massage the introitus to elongate the tissue     05/20/24 Manual: Soft tissue mobilization: Soft tissue work to the diaphragm Myofascial release: Fascial release around the area of the ureters, bladder to go through the restrictions Tissue rolling of the lower rib cage Neuromuscular re-education: Down training: Educated patient on urge to void behavioral technique to deter the urge to void in the morning Diaphragmatic breathing in multiple positions to elongate the lower rib cage with foam roll under thoracic spine in supine, and laying on side with form roll under the lower rib cage Childs pose with diaphragmatic breathing then in supine     PATIENT EDUCATION:  07/13/24 Education details: Access Code: [email or web address], educated patient on how to massage the introitus and gave her uber lube to use; sent patient you tube video for perineal massage and pelvic floor stretches Person educated: Patient Education method: Explanation, Demonstration, Tactile cues, Verbal cues, and Handouts Education comprehension: verbalized understanding, returned demonstration, verbal cues required, tactile cues required, and needs further education  HOME EXERCISE PROGRAM: 07/13/24 Access Code: [email or web address] URL: [email or web address] Date: 07/13/2024 Prepared by: Channing Pereyra  Program Notes Practice fake sneeze in standing   Exercises - Seated Diaphragmatic Breathing  - 1 x daily - 7 x weekly - 1 sets - 10 reps - Seated Pelvic Floor Contraction  - 3 x daily - 7 x weekly - 1 sets - 5 reps - 5 sec hold - Seated  Quick Flick Pelvic Floor Contractions  - 1 x daily - 7 x weekly - 1 sets - 10 reps - Standing Side Lunge With Pelvic Floor Contraction  - 1 x daily - 2 x weekly - 1 sets - 10 reps - Standing Bicep Curl with Pelvic Floor Contraction  - 1 x daily - 2 x weekly - 2 sets - 10 reps - Single Leg Stance Stabilization with Quick Side to  Side Band Oscillations - Lateral Anchor  - 1 x daily - 2 x weekly - 1 sets - 10 reps - Single Leg Stance Stabilization with Quick Side to Side Band Oscillations - Medial Anchor  - 1 x daily - 2 x weekly - 1 sets - 10 reps - Single Leg Stance Stabilization with Quick Front to Back Band Oscillations - Anterior Anchor  - 1 x daily - 2 x weekly - 1 sets - 10 reps - Single Leg Stance Stabilization with Quick Front to Back Band Oscillations - Posterior Anchor  - 1 x daily - 2 x weekly - 1 sets - 10 reps - Forward T  - 1 x daily - 2 x weekly - 1 sets - 10 reps  ASSESSMENT:  CLINICAL IMPRESSION: Patient is a 68 y.o. female  who was seen today for physical therapy  treatment for stress urinary incontinence. Patient has not leaked while playing with her  grandchildren.  Today patient has learned pelvic floor exercises in standing with quick movements. She understands to contract her pelvic floor with sneeze. Patient will benefit from skilled therapy to improve pelvic floor coordination and strength to work on reducing her urinary leakage.   OBJECTIVE IMPAIRMENTS: decreased coordination, decreased endurance, and decreased strength.   ACTIVITY LIMITATIONS: continence  PARTICIPATION LIMITATIONS: interpersonal relationship, shopping, and community activity  PERSONAL FACTORS: Time since onset of injury/illness/exacerbation are also affecting patient's functional outcome.   REHAB POTENTIAL: Excellent  CLINICAL DECISION MAKING: Evolving/moderate complexity  EVALUATION COMPLEXITY: Moderate   GOALS: Goals reviewed with patient? Yes  SHORT TERM GOALS: Target date: 06/01/24  Patient educated on use of vaginal moisturizers to improve vulva health.  Baseline: Goal status: Met 06/22/24  2.  Patient is independent with initial HEP for pelvic floor and abdominal contraction to improve urinary leakage.  Baseline:  Goal status: INITIAL  3.  Patient is able to perform diaphragmatic breathing to bulge the  pelvic floor and move the tissue through the full range.  Baseline:  Goal status: Met 05/21/23   LONG TERM GOALS: Target date: 07/27/24  Patient independent with advanced HEP for pelvic floor strength and coordination to reduce urinary leakage.  Baseline:  Goal status: INITIAL  2.  Patient is able to quickly contract and relax her pelvic floor in 10 seconds 10 x to reduce urinary leakage with coughing and sneezing.  Baseline:  Goal status: INITIAL  3.  Patient pelvic floor strength >/= 3/5 so she is able to quickly move while playing with her grandchildren and not leak urine.  Baseline:  Goal status: INITIAL  4.  Patient reports her vaginal pressure feeling has decreased >/= 80% due to increased in pelvic floor strength >/= 3/5.  Baseline:  Goal status: INITIAL   PLAN:  PT FREQUENCY: 1x/week  PT DURATION: 12 weeks  PLANNED INTERVENTIONS: 97110-Therapeutic exercises, 97530- Therapeutic activity, [id number]- Neuromuscular re-education, 97535- Self Care, [id number]- Manual therapy, Patient/Family education, Dry Needling, and Biofeedback  PLAN FOR NEXT SESSION: update HEP and possible discharge.    Channing Pereyra, PT 07/13/24 10:55  AM

## 2024-07-25 DIAGNOSIS — H401131 Primary open-angle glaucoma, bilateral, mild stage: Secondary | ICD-10-CM | POA: Diagnosis not present

## 2024-07-26 ENCOUNTER — Ambulatory Visit (INDEPENDENT_AMBULATORY_CARE_PROVIDER_SITE_OTHER): Payer: HMO | Admitting: Family Medicine

## 2024-07-26 ENCOUNTER — Ambulatory Visit: Payer: Self-pay | Admitting: Family Medicine

## 2024-07-26 ENCOUNTER — Encounter: Payer: Self-pay | Admitting: Family Medicine

## 2024-07-26 VITALS — BP 112/70 | HR 69 | Temp 97.2°F | Ht 61.0 in | Wt 116.6 lb

## 2024-07-26 DIAGNOSIS — Z0000 Encounter for general adult medical examination without abnormal findings: Secondary | ICD-10-CM | POA: Diagnosis not present

## 2024-07-26 DIAGNOSIS — E785 Hyperlipidemia, unspecified: Secondary | ICD-10-CM | POA: Diagnosis not present

## 2024-07-26 DIAGNOSIS — Z79890 Hormone replacement therapy (postmenopausal): Secondary | ICD-10-CM | POA: Diagnosis not present

## 2024-07-26 DIAGNOSIS — E559 Vitamin D deficiency, unspecified: Secondary | ICD-10-CM

## 2024-07-26 DIAGNOSIS — R739 Hyperglycemia, unspecified: Secondary | ICD-10-CM | POA: Diagnosis not present

## 2024-07-26 LAB — COMPREHENSIVE METABOLIC PANEL WITH GFR
ALT: 13 U/L (ref 0–35)
AST: 18 U/L (ref 0–37)
Albumin: 4.3 g/dL (ref 3.5–5.2)
Alkaline Phosphatase: 19 U/L — ABNORMAL LOW (ref 39–117)
BUN: 12 mg/dL (ref 6–23)
CO2: 28 meq/L (ref 19–32)
Calcium: 8.9 mg/dL (ref 8.4–10.5)
Chloride: 105 meq/L (ref 96–112)
Creatinine, Ser: 0.76 mg/dL (ref 0.40–1.20)
GFR: 80.82 mL/min (ref >60.00)
Glucose, Bld: 90 mg/dL (ref 70–99)
Potassium: 4.4 meq/L (ref 3.5–5.1)
Sodium: 141 meq/L (ref 135–145)
Total Bilirubin: 1 mg/dL (ref 0.2–1.2)
Total Protein: 6.5 g/dL (ref 6.0–8.3)

## 2024-07-26 LAB — CBC WITH DIFFERENTIAL/PLATELET
Basophils Absolute: 0.1 K/uL (ref 0.0–0.1)
Basophils Relative: 1 % (ref 0.0–3.0)
Eosinophils Absolute: 0.1 K/uL (ref 0.0–0.7)
Eosinophils Relative: 1.1 % (ref 0.0–5.0)
HCT: 41.8 % (ref 36.0–46.0)
Hemoglobin: 13.9 g/dL (ref 12.0–15.0)
Lymphocytes Relative: 21.8 % (ref 12.0–46.0)
Lymphs Abs: 1.2 K/uL (ref 0.7–4.0)
MCHC: 33.2 g/dL (ref 30.0–36.0)
MCV: 95.1 fl (ref 78.0–100.0)
Monocytes Absolute: 0.4 K/uL (ref 0.1–1.0)
Monocytes Relative: 7.6 % (ref 3.0–12.0)
Neutro Abs: 3.9 K/uL (ref 1.4–7.7)
Neutrophils Relative %: 68.5 % (ref 43.0–77.0)
Platelets: 247 K/uL (ref 150.0–400.0)
RBC: 4.4 Mil/uL (ref 3.87–5.11)
RDW: 13.2 % (ref 11.5–15.5)
WBC: 5.7 K/uL (ref 4.0–10.5)

## 2024-07-26 LAB — HIGH SENSITIVITY CRP: CRP, High Sensitivity: 1.52 mg/L (ref 0.000–5.000)

## 2024-07-26 LAB — HEMOGLOBIN A1C: Hgb A1c MFr Bld: 5.6 % (ref 4.6–6.5)

## 2024-07-26 LAB — VITAMIN D 25 HYDROXY (VIT D DEFICIENCY, FRACTURES): VITD: 79.33 ng/mL (ref 30.00–100.00)

## 2024-07-26 MED ORDER — DOXYCYCLINE HYCLATE 100 MG PO TABS
100.0000 mg | ORAL_TABLET | Freq: Two times a day (BID) | ORAL | 0 refills | Status: AC
Start: 2024-07-26 — End: 2024-08-02

## 2024-07-26 NOTE — Progress Notes (Signed)
 Phone [phone]   Subjective:  Patient presents today for their annual physical. Chief complaint-noted.   See problem oriented charting- ROS- full  review of systems was completed and negative Per full ROS sheet completed by patient except for topics noted under acute/chronic concerns  The following were reviewed and entered/updated in epic: Past Medical History:  Diagnosis Date   Abnormal Pap smear 06/2004   ASCUS, + HPV, CIN II LEEP   Arthritis    Cataract    Cervical radicular pain    managed by [facility]   Epstein Barr infection    Glaucoma    Heart murmur    Hx of adenomatous polyp of colon 02/13/2016   Hyperlipemia    MITRAL VALVE PROLAPSE 08/05/2007   Qualifier: Diagnosis of  By: Sherron CMA, Cree.Cornelia     Patient Active Problem List   Diagnosis Date Noted   Osteopenia of left femoral neck 10/29/2019    Priority: Medium    Glaucoma 07/28/2018    Priority: Medium    Stenosis of cervical spine 07/18/2014    Priority: Medium    Hyperlipidemia 08/05/2007    Priority: Medium    Vitamin D  deficiency 07/18/2022    Priority: Low   Hx of adenomatous polyp of colon 02/13/2016    Priority: Low   H/O cold sores 07/18/2014    Priority: Low   Cervical spondylosis without myelopathy 01/25/2014    Priority: Low   Past Surgical History:  Procedure Laterality Date   CARPAL TUNNEL RELEASE Right 08/2011   CATARACT EXTRACTION Bilateral    CERVICAL BIOPSY  W/ LOOP ELECTRODE EXCISION  06/2004   CIN II   COLONOSCOPY     COLPOSCOPY     WISDOM TOOTH EXTRACTION  age 42    Family History  Problem Relation Age of Onset   Heart disease Mother        age 42   Diabetes Mother    Alzheimer's disease Mother    Heart attack Father        age 49 died- but was smoker.    Stroke Sister        tia and pacer   Atrial fibrillation Sister    Dementia Sister        early   Transient ischemic attack Sister    Dementia Sister        severe emotional disturbance   Colon cancer Sister         75   Diabetes Maternal Grandmother    Breast cancer Maternal Grandmother    Diabetes Maternal Grandfather    Colon polyps Neg Hx    Esophageal cancer Neg Hx    Rectal cancer Neg Hx    Stomach cancer Neg Hx     Medications- reviewed and updated Current Outpatient Medications  Medication Sig Dispense Refill   acyclovir  (ZOVIRAX ) 200 MG capsule Take 1 capsule (200 mg total) by mouth daily. 100 capsule 3   Cholecalciferol (VITAMIN D3 PO) Take 5,000 Units by mouth.     Cyanocobalamin  (VITAMIN B 12 PO) Take by mouth.     doxycycline  (VIBRA -TABS) 100 MG tablet Take 1 tablet (100 mg total) by mouth 2 (two) times daily for 7 days. Attached tick is identified as an adult or nymphal I. scapularis tick (deer tick). Tick is estimated to have been attached for >=36 hours based on degree of engorgement or time of exposure. Prophylaxis is begun within 72 hours of tick removal. 14 tablet 0   estradiol  (ESTRACE ) 0.1  MG/GM vaginal cream 1 gram vaginally twice weekly 42.5 g 3   estradiol  (VIVELLE -DOT) 0.05 MG/24HR patch Place 1 patch (0.05 mg total) onto the skin 2 (two) times a week. 24 patch 3   Glucosamine 750 MG TABS Take 2 tablets by mouth daily.     KRILL OIL 1000 MG CAPS Take 2 capsules (2,000 mg total) by mouth 2 (two) times daily.     latanoprost (XALATAN) 0.005 % ophthalmic solution   6   Menaquinone-7 (VITAMIN K2 PO) Take by mouth every other day.      Omega-3 1000 MG CAPS Take by mouth.     progesterone  (PROMETRIUM ) 200 MG capsule Take 1 capsule (200 mg total) by mouth daily. 100 capsule 3   Pyridoxine HCl (B-6) 50 MG TABS Take by mouth.     rosuvastatin  (CRESTOR ) 5 MG tablet Take 1 tablet (5 mg total) by mouth daily. 100 tablet 3   Ubiquinol 100 MG CAPS Take by mouth.     UNABLE TO FIND 500 mg. Curcumin     scopolamine  (TRANSDERM-SCOP) 1 MG/3DAYS APPLY 1 PATCH TO HAIRLESS SKIN BEHIND 1 EAR AND LEAVE ON FOR UP TO 3 DAYS (Patient not taking: Reported on 07/26/2024) 4 patch 0   No current  facility-administered medications for this visit.   Allergies-reviewed and updated No Known Allergies  Social History   Social History Narrative   Married. 2 children. 2 grandkids 5 and 1.5 in 05/2017.       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 Oropharynx normal Neck: no thyromegaly CV: RRR no murmurs rubs or gallops Lungs: CTAB no crackles, wheeze, rhonchi Abdomen: soft/nontender/nondistended/normal bowel sounds. No rebound or guarding.  Ext: no edema Skin: warm, dry Neuro: grossly normal, moves all extremities, PERRLA    Assessment and Plan   68 y.o. female presenting for annual physical.  Health Maintenance counseling: 1. Anticipatory guidance: Patient counseled regarding regular dental exams -q6 months, eye exams-follows with Dr. Ruth for glaucoma including yesterday,  avoiding smoking and second hand smoke, limiting alcohol to 1 beverage per day- doesn't drink , no illicit drugs .   2. Risk factor reduction:  Advised patient of need for regular exercise and diet rich and fruits and vegetables to reduce risk of heart attack and stroke.  Exercise-continues to walk regularly.  Diet/weight management-in healthy weight range-encouraged maintenance. Very clean diet- minimal processed food Wt Readings from Last 3 Encounters:  07/26/24 116 lb 9.6 oz (52.9 kg)  04/18/24 117 lb (53.1 kg)  03/02/24 117 lb (53.1 kg)  3. Immunizations/screenings/ancillary studies-declines Prevnar 20 and flu shot.  Currently discontinued COVID-19 immunizations in the past  Immunization History   Administered Date(s) Administered   Influenza Split 10/14/2012   Influenza Whole 10/26/2009   Influenza,inj,Quad PF,6+ Mos 08/29/2013, 09/29/2017, 09/28/2018   Influenza-Unspecified 09/21/2014, 09/05/2015, 09/02/2016   Td 11/21/2006   Tdap 10/23/2015   Zoster Recombinant(Shingrix ) 04/21/2018, 06/30/2018   Zoster, Live 09/22/2016  4. Cervical cancer screening- follows with Dr.  Miller.  Past formal HPV screening recommendations for Pap smears 5. Breast cancer screening-  breast exam with GYN and mammogram 11/09/2023 6. Colon cancer screening - 09/03/2021 with 5-year repeat 7. Skin cancer screening-follows with Dr. Robinson advised regular sunscreen use. Denies worrisome, changing, or new skin lesions.  8. Birth control/STD check-postmenopausal/monogamous 9. Osteoporosis screening at 65-improved in 2025-recheck 2027 for osteopenia- plus hormone therapy 10. Smoking associated screening -never smoker  Status of chronic or acute concerns   #social update- has not needed [facility] lately- has been feeling well  #upcoming travel/camping to higher risk areas for lyme- discussed shorter term prophylaxis but shed prefer 7 day course- sent in with indications listed- we are both hoepful no bites in first place. She is focusing on prevention as well  #hyperlipidemia- has seen Dr. Lonni #CT cardiac score of 2 which is 53rd percentile July 25, 2022 S: Medication:rosuvastatin  5 mg twice a week, ubiquinol Lab Results  Component Value Date   CHOL 163 01/29/2024   HDL 77.70 07/22/2023   LDLCALC 85 07/22/2023   LDLDIRECT 134.3 03/01/2012   TRIG 74 01/29/2024   CHOLHDL 2 07/22/2023  A/P: lipids reasonably controlled- could push for LDL under 70 but she prefers to minimize medicine  #Vitamin D  deficiency S: Medication: 5000 units daily Last vitamin D  Lab Results  Component Value Date   VD25OH 76.53 07/22/2023  A/P: vitamin D  in healthy range- update today   #Murmur-very mild  mitral valve regurgitation and mitral valve prolapse on echo 2022-not clinically significant-I do not always hear a murmur but has been heard by Dr. Mavis and her integrative physician. No chest pain shortness of breath. No palpitations **   #hormone replacement therapy-   with Dr. Cleotilde- hasseen Dr. Celesta in past  # Low Bone density (formerly osteopenia) S: Last DEXA: Improved on 01/20/2024 with worst T-score -1.6 and left femoral neck -plus on hormone replacement therapy   Calcium : 1200mg  (through diet ok) recommended - through diet Vitamin D : 1000 units a day recommended- above this and K2        A/P: improving- continue to monitor     Recommended follow up: Return in about 1 year (around 07/26/2025) for physical or sooner if needed.Schedule b4 you leave. Future Appointments  Date Time Provider Department Center  01/04/2025  9:15 AM Cleotilde Ronal RAMAN, MD [facility]  01/04/2025 10:00 AM [facility] US  OB 1 [facility]  01/04/2025 10:35 AM Cleotilde Ronal RAMAN, MD [facility]  04/25/2025 10:40 AM [facility] ANNUAL WELLNESS VISIT 1 [facility]   Lab/Order associations: fasting   ICD-10-CM   1. Preventative health care  Z00.00 Insulin , random    Hemoglobin A1c    2. Hyperlipidemia, unspecified hyperlipidemia type  E78.5 Comprehensive metabolic panel with GFR    CBC with Differential/Platelet    Apolipoprotein B    Homocysteine    CRP High sensitivity    NMR, lipoprofile    3. Vitamin D  deficiency  E55.9 VITAMIN D  25 Hydroxy (Vit-D Deficiency, Fractures)    4. Hyperglycemia  R73.9     5. Hormone replacement therapy  Z79.890 Estradiol     Progesterone       Meds ordered this encounter  Medications   doxycycline  (VIBRA -TABS) 100 MG tablet    Sig: Take 1 tablet (100 mg total) by mouth 2 (two) times daily for 7 days. Attached tick is identified as an adult or nymphal I. scapularis tick (deer tick). Tick is estimated to have been attached for >=36 hours based  on degree of engorgement or  time of exposure. Prophylaxis is begun within 72 hours of tick removal.    Dispense:  14 tablet    Refill:  0    Return precautions advised.  Garnette Lukes, MD

## 2024-07-26 NOTE — Patient Instructions (Addendum)
 Please stop by lab before you go If you have mychart- we will send your results within 3 business days of us  receiving them.  If you do not have mychart- we will call you about results within 5 business days of us  receiving them.  *please also note that you will see labs on mychart as soon as they post. I will later go in and write notes on them- will say notes from Dr. Katrinka Mose you are doing so well  Recommended follow up: Return in about 1 year (around 07/26/2025) for physical or sooner if needed.Schedule b4 you leave.

## 2024-07-27 ENCOUNTER — Encounter: Admitting: Physical Therapy

## 2024-07-27 ENCOUNTER — Encounter (HOSPITAL_BASED_OUTPATIENT_CLINIC_OR_DEPARTMENT_OTHER): Payer: Self-pay | Admitting: Obstetrics & Gynecology

## 2024-07-27 LAB — PROGESTERONE: Progesterone: 24.8 ng/mL

## 2024-07-27 LAB — HOMOCYSTEINE: Homocysteine: 7 umol/L (ref <=13.4)

## 2024-07-27 LAB — ESTRADIOL: Estradiol: 57 pg/mL

## 2024-07-28 ENCOUNTER — Other Ambulatory Visit (HOSPITAL_BASED_OUTPATIENT_CLINIC_OR_DEPARTMENT_OTHER): Payer: Self-pay | Admitting: Obstetrics & Gynecology

## 2024-07-28 DIAGNOSIS — Z79890 Hormone replacement therapy (postmenopausal): Secondary | ICD-10-CM

## 2024-07-28 LAB — NMR, LIPOPROFILE
Cholesterol, Total: 190 mg/dL (ref 100–199)
HDL Particle Number: 40.5 umol/L (ref >=30.5)
HDL-C: 77 mg/dL (ref >39)
LDL Particle Number: 1057 nmol/L — AB (ref <1000)
LDL Size: 21.1 nm (ref >20.5)
LDL-C (NIH Calc): 98 mg/dL (ref 0–99)
LP-IR Score: <25 (ref <=45)
Small LDL Particle Number: 285 nmol/L (ref <=527)
Triglycerides: 81 mg/dL (ref 0–149)

## 2024-07-28 LAB — APOLIPOPROTEIN B: Apolipoprotein B: 84 mg/dL (ref <90)

## 2024-07-28 MED ORDER — ESTRADIOL 0.075 MG/24HR TD PTTW: 1.0000 | MEDICATED_PATCH | TRANSDERMAL | 1 refills | Status: DC

## 2024-07-29 ENCOUNTER — Encounter (HOSPITAL_BASED_OUTPATIENT_CLINIC_OR_DEPARTMENT_OTHER): Payer: Self-pay

## 2024-07-29 NOTE — Telephone Encounter (Signed)
 Forwarding to Dr. Katrinka to review and advise.

## 2024-08-03 ENCOUNTER — Other Ambulatory Visit: Payer: Self-pay | Admitting: Family Medicine

## 2024-08-03 ENCOUNTER — Encounter: Payer: Self-pay | Admitting: Family Medicine

## 2024-08-03 DIAGNOSIS — Z0000 Encounter for general adult medical examination without abnormal findings: Secondary | ICD-10-CM

## 2024-08-10 ENCOUNTER — Other Ambulatory Visit (HOSPITAL_BASED_OUTPATIENT_CLINIC_OR_DEPARTMENT_OTHER)

## 2024-08-10 ENCOUNTER — Ambulatory Visit (HOSPITAL_BASED_OUTPATIENT_CLINIC_OR_DEPARTMENT_OTHER): Admitting: Obstetrics & Gynecology

## 2024-10-19 ENCOUNTER — Other Ambulatory Visit: Payer: Self-pay | Admitting: Medical Genetics

## 2024-10-19 DIAGNOSIS — Z006 Encounter for examination for normal comparison and control in clinical research program: Secondary | ICD-10-CM

## 2024-10-25 DIAGNOSIS — L658 Other specified nonscarring hair loss: Secondary | ICD-10-CM | POA: Diagnosis not present

## 2024-10-25 DIAGNOSIS — L650 Telogen effluvium: Secondary | ICD-10-CM | POA: Diagnosis not present

## 2024-10-25 DIAGNOSIS — L661 Lichen planopilaris, unspecified: Secondary | ICD-10-CM | POA: Diagnosis not present

## 2024-11-02 ENCOUNTER — Other Ambulatory Visit (HOSPITAL_BASED_OUTPATIENT_CLINIC_OR_DEPARTMENT_OTHER): Payer: Self-pay | Admitting: Obstetrics & Gynecology

## 2024-11-02 DIAGNOSIS — Z79890 Hormone replacement therapy (postmenopausal): Secondary | ICD-10-CM

## 2024-11-07 ENCOUNTER — Other Ambulatory Visit (HOSPITAL_BASED_OUTPATIENT_CLINIC_OR_DEPARTMENT_OTHER): Payer: Self-pay

## 2024-11-07 DIAGNOSIS — Z79890 Hormone replacement therapy (postmenopausal): Secondary | ICD-10-CM | POA: Diagnosis not present

## 2024-11-07 DIAGNOSIS — Z1231 Encounter for screening mammogram for malignant neoplasm of breast: Secondary | ICD-10-CM

## 2024-11-07 DIAGNOSIS — Z0000 Encounter for general adult medical examination without abnormal findings: Secondary | ICD-10-CM

## 2024-11-08 ENCOUNTER — Ambulatory Visit (HOSPITAL_BASED_OUTPATIENT_CLINIC_OR_DEPARTMENT_OTHER): Payer: Self-pay | Admitting: Obstetrics & Gynecology

## 2024-11-08 LAB — PROGESTERONE: Progesterone: 4.7 ng/mL

## 2024-11-08 LAB — ESTRADIOL: Estradiol: 143 pg/mL — ABNORMAL HIGH (ref 0.0–54.7)

## 2024-11-30 ENCOUNTER — Encounter (HOSPITAL_BASED_OUTPATIENT_CLINIC_OR_DEPARTMENT_OTHER): Payer: Self-pay | Admitting: Obstetrics & Gynecology

## 2024-11-30 ENCOUNTER — Ambulatory Visit (HOSPITAL_BASED_OUTPATIENT_CLINIC_OR_DEPARTMENT_OTHER)

## 2024-11-30 ENCOUNTER — Other Ambulatory Visit (HOSPITAL_BASED_OUTPATIENT_CLINIC_OR_DEPARTMENT_OTHER): Payer: Self-pay | Admitting: Obstetrics & Gynecology

## 2024-11-30 ENCOUNTER — Ambulatory Visit (HOSPITAL_BASED_OUTPATIENT_CLINIC_OR_DEPARTMENT_OTHER): Admitting: Obstetrics & Gynecology

## 2024-11-30 DIAGNOSIS — N950 Postmenopausal bleeding: Secondary | ICD-10-CM

## 2024-11-30 DIAGNOSIS — Z79890 Hormone replacement therapy (postmenopausal): Secondary | ICD-10-CM

## 2024-11-30 MED ORDER — ESTRADIOL 0.075 MG/24HR TD PTTW: 1.0000 | MEDICATED_PATCH | TRANSDERMAL | 1 refills | Status: AC

## 2024-11-30 NOTE — Progress Notes (Unsigned)
° °  Ultrasound f/u Patient name: Cheyenne Myers MRN [id number]  Date of birth: 1956-02-11 Chief Complaint:   Follow-up  History of Present Illness:   Cheyenne Myers is a 68 y.o. G77P2002 {race:25618} female being seen today for discussion of ultrasound findings.  Patient's last menstrual period was 10/17/2009 (exact date).   Last pap ***. Results were: {Pap findings:25134}. H/O abnormal pap: {yes/yes***/no:23866}  Review of Systems:   Pertinent items are noted in HPI Denies any urinary or bowel changes or pelvic pain*** Pertinent History Reviewed:  Reviewed past medical,surgical, social and family history.  Reviewed problem list, medications and allergies. Physical Assessment:   Vitals:   11/30/24 0951  BP: 124/80  Pulse: 72  SpO2: 100%  Weight: 118 lb 9.6 oz (53.8 kg)  Body mass index is 22.41 kg/m.        Physical Examination:   General appearance - well appearing, and in no distress  Mental status - alert, oriented to person, place, and time  Psych:  She has a normal mood and affect   Abdomen - soft, nontender, nondistended, no masses or organomegaly  Pelvic - VULVA: normal appearing vulva with no masses, tenderness or lesions   VAGINA: normal appearing vagina with normal color and discharge, no lesions   CERVIX: normal appearing cervix without discharge or lesions, no CMT  Thin prep pap is {Desc; done/not:10129} *** HR HPV cotesting  UTERUS: uterus is felt to be normal size, shape, consistency and nontender   ADNEXA: No adnexal masses or tenderness noted.  Rectal - normal rectal, good sphincter tone, no masses felt.  Assessment & Plan:  There are no diagnoses linked to this encounter.  No orders of the defined types were placed in this encounter.   Meds: No orders of the defined types were placed in this encounter.   Follow-up: No follow-ups on file.  Ronal GORMAN Pinal, MD 11/30/2024 10:43 AM GYNECOLOGY  VISIT

## 2024-12-05 ENCOUNTER — Encounter: Payer: Self-pay | Admitting: Family Medicine

## 2024-12-10 ENCOUNTER — Encounter: Payer: Self-pay | Admitting: Family Medicine

## 2024-12-12 ENCOUNTER — Other Ambulatory Visit (HOSPITAL_BASED_OUTPATIENT_CLINIC_OR_DEPARTMENT_OTHER): Payer: Self-pay | Admitting: Obstetrics & Gynecology

## 2024-12-12 ENCOUNTER — Other Ambulatory Visit

## 2024-12-12 ENCOUNTER — Other Ambulatory Visit: Payer: Self-pay | Admitting: Family Medicine

## 2024-12-12 DIAGNOSIS — Z1231 Encounter for screening mammogram for malignant neoplasm of breast: Secondary | ICD-10-CM

## 2024-12-12 DIAGNOSIS — Z0000 Encounter for general adult medical examination without abnormal findings: Secondary | ICD-10-CM | POA: Diagnosis not present

## 2024-12-12 LAB — COMPREHENSIVE METABOLIC PANEL WITH GFR
ALT: 14 U/L (ref 3–35)
AST: 18 U/L (ref 5–37)
Albumin: 4.2 g/dL (ref 3.5–5.2)
Alkaline Phosphatase: 17 U/L — ABNORMAL LOW (ref 39–117)
BUN: 15 mg/dL (ref 6–23)
CO2: 26 meq/L (ref 19–32)
Calcium: 8.7 mg/dL (ref 8.4–10.5)
Chloride: 103 meq/L (ref 96–112)
Creatinine, Ser: 0.83 mg/dL (ref 0.40–1.20)
GFR: 72.52 mL/min
Glucose, Bld: 92 mg/dL (ref 70–99)
Potassium: 4.5 meq/L (ref 3.5–5.1)
Sodium: 137 meq/L (ref 135–145)
Total Bilirubin: 0.9 mg/dL (ref 0.2–1.2)
Total Protein: 6.5 g/dL (ref 6.0–8.3)

## 2024-12-13 ENCOUNTER — Ambulatory Visit (HOSPITAL_BASED_OUTPATIENT_CLINIC_OR_DEPARTMENT_OTHER)
Admission: RE | Admit: 2024-12-13 | Discharge: 2024-12-13 | Disposition: A | Discharge status: Home / Self Care | Source: Ambulatory Visit | Attending: Obstetrics & Gynecology | Admitting: Obstetrics & Gynecology

## 2024-12-13 ENCOUNTER — Encounter (HOSPITAL_BASED_OUTPATIENT_CLINIC_OR_DEPARTMENT_OTHER): Payer: Self-pay | Admitting: Radiology

## 2024-12-13 DIAGNOSIS — Z1231 Encounter for screening mammogram for malignant neoplasm of breast: Secondary | ICD-10-CM | POA: Diagnosis present

## 2024-12-13 LAB — INSULIN, RANDOM: Insulin: 7.3 u[IU]/mL

## 2024-12-14 ENCOUNTER — Ambulatory Visit: Payer: Self-pay | Admitting: Family Medicine

## 2024-12-21 ENCOUNTER — Other Ambulatory Visit: Payer: Self-pay | Admitting: Obstetrics & Gynecology

## 2024-12-21 DIAGNOSIS — R928 Other abnormal and inconclusive findings on diagnostic imaging of breast: Secondary | ICD-10-CM

## 2024-12-23 ENCOUNTER — Ambulatory Visit (HOSPITAL_BASED_OUTPATIENT_CLINIC_OR_DEPARTMENT_OTHER): Payer: Self-pay | Admitting: Obstetrics & Gynecology

## 2024-12-25 ENCOUNTER — Encounter: Payer: Self-pay | Admitting: Family Medicine

## 2024-12-25 DIAGNOSIS — R748 Abnormal levels of other serum enzymes: Secondary | ICD-10-CM

## 2024-12-27 ENCOUNTER — Ambulatory Visit: Payer: Self-pay | Admitting: Family Medicine

## 2024-12-27 DIAGNOSIS — R748 Abnormal levels of other serum enzymes: Secondary | ICD-10-CM

## 2024-12-27 LAB — IBC + FERRITIN
Ferritin: 46.7 ng/mL (ref 10.0–291.0)
Iron: 99 ug/dL (ref 42–145)
Saturation Ratios: 37 % (ref 20.0–50.0)
TIBC: 267.4 ug/dL (ref 250.0–450.0)
Transferrin: 191 mg/dL — ABNORMAL LOW (ref 212.0–360.0)

## 2024-12-27 LAB — MAGNESIUM: Magnesium: 2.1 mg/dL (ref 1.5–2.5)

## 2024-12-27 LAB — TSH: TSH: 1.57 u[IU]/mL (ref 0.35–5.50)

## 2024-12-28 ENCOUNTER — Encounter (HOSPITAL_BASED_OUTPATIENT_CLINIC_OR_DEPARTMENT_OTHER): Payer: Self-pay | Admitting: Obstetrics & Gynecology

## 2024-12-29 LAB — CELIAC DISEASE COMPREHENSIVE PANEL WITH REFLEXES
(tTG) Ab, IgA: <1 U/mL
Immunoglobulin A: 167 mg/dL (ref 70–320)

## 2024-12-30 ENCOUNTER — Encounter: Payer: Self-pay | Admitting: Family Medicine

## 2024-12-30 ENCOUNTER — Ambulatory Visit
Admission: RE | Admit: 2024-12-30 | Discharge: 2024-12-30 | Disposition: A | Source: Ambulatory Visit | Attending: Obstetrics & Gynecology | Admitting: Obstetrics & Gynecology

## 2024-12-30 DIAGNOSIS — R928 Other abnormal and inconclusive findings on diagnostic imaging of breast: Secondary | ICD-10-CM

## 2024-12-31 LAB — ZINC: Zinc: 69 ug/dL (ref 60–130)

## 2025-01-04 ENCOUNTER — Ambulatory Visit (HOSPITAL_BASED_OUTPATIENT_CLINIC_OR_DEPARTMENT_OTHER): Admitting: Obstetrics & Gynecology

## 2025-01-04 ENCOUNTER — Encounter (HOSPITAL_BASED_OUTPATIENT_CLINIC_OR_DEPARTMENT_OTHER): Payer: Self-pay

## 2025-01-04 ENCOUNTER — Other Ambulatory Visit (HOSPITAL_BASED_OUTPATIENT_CLINIC_OR_DEPARTMENT_OTHER): Payer: Self-pay

## 2025-01-04 ENCOUNTER — Other Ambulatory Visit (HOSPITAL_BASED_OUTPATIENT_CLINIC_OR_DEPARTMENT_OTHER)

## 2025-01-04 DIAGNOSIS — Z79890 Hormone replacement therapy (postmenopausal): Secondary | ICD-10-CM

## 2025-01-05 LAB — ESTRADIOL: Estradiol: 48 pg/mL (ref 0.0–54.7)

## 2025-01-05 LAB — PROGESTERONE: Progesterone: 4.6 ng/mL

## 2025-01-09 ENCOUNTER — Ambulatory Visit (HOSPITAL_BASED_OUTPATIENT_CLINIC_OR_DEPARTMENT_OTHER): Payer: Self-pay | Admitting: Obstetrics & Gynecology

## 2025-01-11 ENCOUNTER — Ambulatory Visit: Admitting: Family Medicine

## 2025-01-24 ENCOUNTER — Ambulatory Visit: Admitting: Family Medicine

## 2025-01-30 ENCOUNTER — Ambulatory Visit: Admitting: Family Medicine

## 2025-02-03 ENCOUNTER — Ambulatory Visit: Admitting: Family Medicine

## 2025-04-25 ENCOUNTER — Ambulatory Visit

## 2025-08-01 ENCOUNTER — Encounter: Admitting: Family Medicine
